# Patient Record
Sex: Male | Born: 1935 | Race: White | Hispanic: No | Marital: Married | Smoking: Never smoker
Health system: Southern US, Community
[De-identification: ages and names within clinical notes are randomized; demographics above are authoritative.]

## PROBLEM LIST (undated history)

## (undated) DIAGNOSIS — E785 Hyperlipidemia, unspecified: Secondary | ICD-10-CM

## (undated) DIAGNOSIS — N400 Enlarged prostate without lower urinary tract symptoms: Secondary | ICD-10-CM

## (undated) DIAGNOSIS — I10 Essential (primary) hypertension: Secondary | ICD-10-CM

## (undated) DIAGNOSIS — C679 Malignant neoplasm of bladder, unspecified: Secondary | ICD-10-CM

## (undated) DIAGNOSIS — I341 Nonrheumatic mitral (valve) prolapse: Secondary | ICD-10-CM

## (undated) DIAGNOSIS — J302 Other seasonal allergic rhinitis: Secondary | ICD-10-CM

## (undated) DIAGNOSIS — G20 Parkinson's disease: Secondary | ICD-10-CM

## (undated) DIAGNOSIS — R011 Cardiac murmur, unspecified: Secondary | ICD-10-CM

## (undated) DIAGNOSIS — G20A1 Parkinson's disease without dyskinesia, without mention of fluctuations: Secondary | ICD-10-CM

## (undated) DIAGNOSIS — K56609 Unspecified intestinal obstruction, unspecified as to partial versus complete obstruction: Secondary | ICD-10-CM

## (undated) DIAGNOSIS — D126 Benign neoplasm of colon, unspecified: Secondary | ICD-10-CM

## (undated) HISTORY — PX: TONSILLECTOMY AND ADENOIDECTOMY: SHX28

## (undated) HISTORY — DX: Hyperlipidemia, unspecified: E78.5

## (undated) HISTORY — DX: Nonrheumatic mitral (valve) prolapse: I34.1

## (undated) HISTORY — PX: TONSILLECTOMY: SUR1361

## (undated) HISTORY — DX: Benign neoplasm of colon, unspecified: D12.6

## (undated) HISTORY — DX: Malignant neoplasm of bladder, unspecified: C67.9

## (undated) HISTORY — PX: ELBOW SURGERY: SHX618

## (undated) HISTORY — PX: APPENDECTOMY: SHX54

---

## 2001-08-22 ENCOUNTER — Encounter (INDEPENDENT_AMBULATORY_CARE_PROVIDER_SITE_OTHER): Payer: Self-pay | Admitting: Specialist

## 2001-08-22 ENCOUNTER — Other Ambulatory Visit: Admission: RE | Admit: 2001-08-22 | Discharge: 2001-08-22 | Payer: Self-pay | Admitting: Urology

## 2002-03-16 ENCOUNTER — Encounter: Payer: Self-pay | Admitting: Emergency Medicine

## 2002-03-16 ENCOUNTER — Inpatient Hospital Stay (HOSPITAL_COMMUNITY): Admission: EM | Admit: 2002-03-16 | Discharge: 2002-03-20 | Payer: Self-pay | Admitting: Emergency Medicine

## 2002-03-17 ENCOUNTER — Encounter: Payer: Self-pay | Admitting: Cardiology

## 2002-03-19 ENCOUNTER — Encounter: Payer: Self-pay | Admitting: Cardiology

## 2002-10-09 ENCOUNTER — Emergency Department (HOSPITAL_COMMUNITY): Admission: EM | Admit: 2002-10-09 | Discharge: 2002-10-10 | Payer: Self-pay | Admitting: Emergency Medicine

## 2002-10-09 ENCOUNTER — Encounter: Payer: Self-pay | Admitting: Emergency Medicine

## 2005-04-09 ENCOUNTER — Ambulatory Visit: Payer: Self-pay | Admitting: Internal Medicine

## 2005-05-08 ENCOUNTER — Ambulatory Visit: Payer: Self-pay | Admitting: Internal Medicine

## 2005-05-08 ENCOUNTER — Encounter (INDEPENDENT_AMBULATORY_CARE_PROVIDER_SITE_OTHER): Payer: Self-pay | Admitting: Specialist

## 2006-04-06 ENCOUNTER — Ambulatory Visit: Payer: Self-pay

## 2007-03-18 ENCOUNTER — Ambulatory Visit: Payer: Self-pay | Admitting: Cardiology

## 2007-03-25 ENCOUNTER — Ambulatory Visit: Payer: Self-pay | Admitting: Cardiovascular Disease

## 2007-03-25 ENCOUNTER — Inpatient Hospital Stay (HOSPITAL_BASED_OUTPATIENT_CLINIC_OR_DEPARTMENT_OTHER): Admission: RE | Admit: 2007-03-25 | Discharge: 2007-03-25 | Payer: Self-pay | Admitting: Cardiovascular Disease

## 2007-04-05 ENCOUNTER — Ambulatory Visit: Payer: Self-pay | Admitting: Cardiology

## 2007-04-12 ENCOUNTER — Ambulatory Visit (HOSPITAL_COMMUNITY): Admission: RE | Admit: 2007-04-12 | Discharge: 2007-04-12 | Payer: Self-pay | Admitting: Internal Medicine

## 2008-03-14 ENCOUNTER — Emergency Department (HOSPITAL_COMMUNITY): Admission: EM | Admit: 2008-03-14 | Discharge: 2008-03-14 | Payer: Self-pay | Admitting: Emergency Medicine

## 2008-11-16 DIAGNOSIS — C679 Malignant neoplasm of bladder, unspecified: Secondary | ICD-10-CM

## 2008-11-16 HISTORY — DX: Malignant neoplasm of bladder, unspecified: C67.9

## 2008-11-16 HISTORY — PX: BLADDER SURGERY: SHX569

## 2009-05-23 ENCOUNTER — Encounter (INDEPENDENT_AMBULATORY_CARE_PROVIDER_SITE_OTHER): Payer: Self-pay | Admitting: *Deleted

## 2009-09-10 ENCOUNTER — Encounter (INDEPENDENT_AMBULATORY_CARE_PROVIDER_SITE_OTHER): Payer: Self-pay | Admitting: Urology

## 2009-09-10 ENCOUNTER — Ambulatory Visit (HOSPITAL_BASED_OUTPATIENT_CLINIC_OR_DEPARTMENT_OTHER): Admission: RE | Admit: 2009-09-10 | Discharge: 2009-09-10 | Payer: Self-pay | Admitting: Podiatry

## 2009-10-03 ENCOUNTER — Inpatient Hospital Stay (HOSPITAL_COMMUNITY): Admission: EM | Admit: 2009-10-03 | Discharge: 2009-10-07 | Payer: Self-pay | Admitting: Emergency Medicine

## 2010-05-26 ENCOUNTER — Encounter: Payer: Self-pay | Admitting: Cardiovascular Disease

## 2010-05-26 ENCOUNTER — Ambulatory Visit: Payer: Self-pay | Admitting: Cardiology

## 2010-05-26 ENCOUNTER — Emergency Department (HOSPITAL_COMMUNITY): Admission: EM | Admit: 2010-05-26 | Discharge: 2010-05-26 | Payer: Self-pay | Admitting: Emergency Medicine

## 2010-10-14 DIAGNOSIS — R310 Gross hematuria: Secondary | ICD-10-CM | POA: Insufficient documentation

## 2010-10-14 DIAGNOSIS — C679 Malignant neoplasm of bladder, unspecified: Secondary | ICD-10-CM | POA: Insufficient documentation

## 2010-10-14 DIAGNOSIS — E785 Hyperlipidemia, unspecified: Secondary | ICD-10-CM

## 2010-10-14 DIAGNOSIS — I10 Essential (primary) hypertension: Secondary | ICD-10-CM

## 2010-10-14 DIAGNOSIS — I447 Left bundle-branch block, unspecified: Secondary | ICD-10-CM

## 2010-10-15 ENCOUNTER — Ambulatory Visit: Payer: Self-pay | Admitting: Cardiology

## 2010-10-15 ENCOUNTER — Encounter: Payer: Self-pay | Admitting: Cardiology

## 2010-10-15 DIAGNOSIS — R0602 Shortness of breath: Secondary | ICD-10-CM

## 2010-10-15 DIAGNOSIS — IMO0001 Reserved for inherently not codable concepts without codable children: Secondary | ICD-10-CM

## 2010-10-15 DIAGNOSIS — R5383 Other fatigue: Secondary | ICD-10-CM

## 2010-10-15 DIAGNOSIS — R5381 Other malaise: Secondary | ICD-10-CM | POA: Insufficient documentation

## 2010-10-17 LAB — CONVERTED CEMR LAB
ALT: 15 units/L (ref 0–53)
AST: 19 units/L (ref 0–37)
Alkaline Phosphatase: 59 units/L (ref 39–117)
Basophils Absolute: 0 10*3/uL (ref 0.0–0.1)
Bilirubin, Direct: 0.1 mg/dL (ref 0.0–0.3)
CO2: 28 meq/L (ref 19–32)
Chloride: 101 meq/L (ref 96–112)
Eosinophils Relative: 2.3 % (ref 0.0–5.0)
Glucose, Bld: 118 mg/dL — ABNORMAL HIGH (ref 70–99)
Lymphocytes Relative: 32.3 % (ref 12.0–46.0)
Monocytes Relative: 9.7 % (ref 3.0–12.0)
Platelets: 161 10*3/uL (ref 150.0–400.0)
Potassium: 4.4 meq/L (ref 3.5–5.1)
RDW: 12.3 % (ref 11.5–14.6)
Sed Rate: 5 mm/hr (ref 0–22)
Sodium: 136 meq/L (ref 135–145)
Total CK: 65 units/L (ref 7–232)
Total Protein: 7 g/dL (ref 6.0–8.3)
WBC: 5.7 10*3/uL (ref 4.5–10.5)

## 2010-10-27 ENCOUNTER — Telehealth (INDEPENDENT_AMBULATORY_CARE_PROVIDER_SITE_OTHER): Payer: Self-pay | Admitting: Radiology

## 2010-10-28 ENCOUNTER — Encounter (HOSPITAL_COMMUNITY)
Admission: RE | Admit: 2010-10-28 | Discharge: 2010-12-16 | Payer: Self-pay | Source: Home / Self Care | Attending: Cardiology | Admitting: Cardiology

## 2010-10-28 ENCOUNTER — Ambulatory Visit: Payer: Self-pay

## 2010-10-28 ENCOUNTER — Encounter: Payer: Self-pay | Admitting: Cardiology

## 2010-10-28 ENCOUNTER — Ambulatory Visit (HOSPITAL_COMMUNITY)
Admission: RE | Admit: 2010-10-28 | Discharge: 2010-10-28 | Payer: Self-pay | Source: Home / Self Care | Attending: Cardiology | Admitting: Cardiology

## 2010-12-16 NOTE — Letter (Signed)
Summary: [facility] Physician Documentation Sheet  [facility] Physician Documentation Sheet   Imported By: Earl Many 10/14/2010 15:59:10  _____________________________________________________________________  External Attachment:    Type:   Image     Comment:   External Document

## 2010-12-16 NOTE — Assessment & Plan Note (Signed)
Summary: rov per pt call/lg   CC:  check up...pt states he stopped his lovastatin.  History of Present Illness: Mr. Jeremiah Zimmerman is a 77 year old male with a past medical history of hypertension and hyperlipidemia, as well as nonischemic cardiomyopathy who presents for evaluation of lower extremity weakness and dyspnea.  The patient has not been seen in this office since May of 2008.  Note:  He does have a history of nonischemic cardiomyopathy by report.  He had a cardiac catheterization performed on Mar 20, 2002, by Dr. Gerri Spore. There was moderate global hypokinesis of the left ventricle with an ejection fraction of 45 percent. There was mild mitral valve prolapse and trace mitral regurgitation.  He had a 30 percent left main, 40 percent LAD at the origin and 20 percent mid.  There was no other obstructive disease noted.  He was treated medically for that.  Repeat cardiac catheterization in May of 2008 showed nonobstructive coronary disease and an ejection fraction of 50%. Note:  His most recent echocardiogram was performed on Apr 06, 2006, and he had normal LV function.  There was mild mitral regurgitation.  There was also mild tricuspid regurgitation.  He had carotid Doppler performed in May of 2003 that showed no significant obstruction. The patient does have some dyspnea on exertion but there is no orthopnea, PND, pedal edema or syncope. He does not have chest pain. He does notice some dizziness with going from the sitting to the standing position. He also over the past 6-8 months has noticed progressive weakness in his thighs bilaterally. He finds it difficult to stand from a sitting position. There is also pain in his hips and thighs. There is some cramping in his calves but there is no weakness. He discontinued his lovastatin but this has not helped.  Current Medications (verified): 1)  Flomax 0.4 Mg Caps (Tamsulosin Hcl) .Marland Kitchen.. 1 Tab By Mouth Once Daily 2)  Aspirin 81 Mg Tbec (Aspirin) .... Take One Tablet  By Mouth Daily 3)  Lisinopril 10 Mg Tabs (Lisinopril) .... Take One Tablet By Mouth Daily 4)  Fish Oil   Oil (Fish Oil) .Marland Kitchen.. 1 Tab By Mouth Once Daily  Past History:  Past Medical History: BUNDLE BRANCH BLOCK, LEFT HYPERLIPIDEMIA HYPERTENSION  BPH BLADDER CANCER   Past Surgical History: bladder cancer status post TURP in November 2011 Surgery on left elbow  Tonsillectomy  Appendectomy  Family History: Reviewed history from 10/14/2010 and no changes required. Mother deceased with breast cancer.  Father had an MI in his 1s and 61s.  He also had a CVA and a history of bladder cancer  and hypertension.  He has a brother with an MI at age 61 and two sisters with hypertension.      Social History: Reviewed history from 10/14/2010 and no changes required.  He lives in [place] with his wife.  He is a retired Optician, dispensing.  He has 3 children.  Does not smoke, drink, use drugs.   Review of Systems       Complains of fatigue and weakness but no fevers or chills, productive cough, hemoptysis, dysphasia, odynophagia, melena, hematochezia, dysuria, hematuria, rash, seizure activity, orthopnea, PND, pedal edema, claudication. Remaining systems are negative.   Vital Signs:  Patient profile:   77 year old male Height:      70 inches Weight:      172 pounds BMI:     24.77 Pulse rate:   70 / minute Resp:     14 per minute  BP sitting:   155 / 68  (left arm)  Vitals Entered By: Kem Parkinson (October 15, 2010 3:29 PM)  Physical Exam  General:  Well developed/well nourished in NAD Skin warm/dry Patient not depressed No peripheral clubbing Back-normal HEENT-normal/normal eyelids Neck supple/normal carotid upstroke bilaterally; no bruits; no JVD; no thyromegaly chest - CTA/ normal expansion CV - RRR/normal S1 and S2; no  rubs or gallops;  PMI nondisplaced; 2/6 systolic ejection murmur Abdomen -NT/ND, no HSM, no mass, + bowel sounds, no bruit 2+ femoral pulses, no bruits Ext-no  edema, chords, 2+ DP Neuro-grossly nonfocal     EKG  Procedure date:  10/15/2010  Findings:      Sinus rhythm with a left bundle branch block.  Impression & Recommendations:  Problem # 1:  MUSCLE PAIN (ICD-729.1) Etiology unclear. He is also complaining of bilateral thigh weakness to the point he cannot stand at times. He discontinued his lovastatin and his symptoms have not improved. I will check a creatinine kinase as well as a sed rate. I will refer him to neurology for further evaluation and consideration of  syndromes such as proximal myotonic myopathy, myasthenia gravis or polymyositis.  Problem # 2:  OTHER MALAISE AND FATIGUE (ICD-780.79) Check TSH and hemoglobin.  Orders: TLB-CK Total Only(Creatine Kinase/CPK) (82550-CK) TLB-Sedimentation Rate (ESR) (85652-ESR) TLB-CBC Platelet - w/Differential (85025-CBCD) TLB-BMP (Basic Metabolic Panel-BMET) (80048-METABOL) TLB-Hepatic/Liver Function Pnl (80076-HEPATIC) TLB-TSH (Thyroid Stimulating Hormone) (02725-DGU) Neurology Referral (Neuro)  Problem # 3:  DYSPNEA (ICD-786.05) Schedule echocardiogram and Myoview to further evaluate. His updated medication list for this problem includes:    Aspirin 81 Mg Tbec (Aspirin) .Marland Kitchen... Take one tablet by mouth daily    Lisinopril 10 Mg Tabs (Lisinopril) .Marland Kitchen... Take one tablet by mouth daily  Orders: Echocardiogram (Echo) Nuclear Stress Test (Nuc Stress Test)  Problem # 4:  HYPERLIPIDEMIA (ICD-272.4) Given muscle weakness we'll continue off of statin.  Problem # 5:  HYPERTENSION (ICD-401.9) Blood pressure mildly elevated but he is complaining of dizziness with standing. I will therefore not increase his medications at this point. His updated medication list for this problem includes:    Aspirin 81 Mg Tbec (Aspirin) .Marland Kitchen... Take one tablet by mouth daily    Lisinopril 10 Mg Tabs (Lisinopril) .Marland Kitchen... Take one tablet by mouth daily  Patient Instructions: 1)  Your physician recommends that  you schedule a follow-up appointment in: 3 MONTHS 2)  You have been referred to NEUROLOGY-PROXIMAL LEG WEAKNESS AND FATIQUE 3)  Your physician has requested that you have an echocardiogram.  Echocardiography is a painless test that uses sound waves to create images of your heart. It provides your doctor with information about the size and shape of your heart and how well your heart's chambers and valves are working.  This procedure takes approximately one hour. There are no restrictions for this procedure. 4)  Your physician has requested that you have an LEXISCAN stress myoview.  For further information please visit [email or web address]  Please follow instruction sheet, as given.

## 2010-12-18 NOTE — Assessment & Plan Note (Signed)
Summary: Cardiology Nuclear Testing  Nuclear Med Background Indications for Stress Test: Evaluation for Ischemia   History: Echo, Heart Catheterization  History Comments: '07 Echo:normal EF; '08 Cath:n/o CAD, EF=50%  Symptoms: Chest Pressure, Diaphoresis, DOE, Fatigue, Palpitations  Symptoms Comments: Last episode of CP:2 months ago.   Nuclear Pre-Procedure Cardiac Risk Factors: Family History - CAD, Hypertension, LBBB, Lipids Caffeine/Decaff Intake: none NPO After: 8:00 PM Lungs: Clear.  O2 Sat 98% on RA. IV 0.9% NS with Angio Cath: 22g     IV Site: R Hand IV Started by: Cathlyn Parsons, RN Chest Size (in) 38-40     Height (in): 70 Weight (lb): 172 BMI: 24.77  Nuclear Med Study 1 or 2 day study:  1 day     Stress Test Type:  Adenosine Reading MD:  Olga Millers, MD     Referring MD:  Olga Millers, MD Resting Radionuclide:  Technetium 71m Tetrofosmin     Resting Radionuclide Dose:  11.0 mCi  Stress Radionuclide:  Technetium 53m Tetrofosmin     Stress Radionuclide Dose:  33.0 mCi   Stress Protocol  Dose of Adenosine:  43.8 mg    Stress Test Technologist:  Rea College, CMA-N     Nuclear Technologist:  Domenic Polite, CNMT  Rest Procedure  Myocardial perfusion imaging was performed at rest 45 minutes following the intravenous administration of Technetium 61m Tetrofosmin.  Stress Procedure  The patient received IV adenosine at 140 mcg/kg/min for 4 minutes. There were episodes of complete heart block with infusion. She did c/o chest pressure with infusion.  Technetium 87m Tetrofosmin was injected at the 2 minute mark and quantitative spect images were obtained after a 45 minute delay.  QPS Raw Data Images:  Acquisition technically good; normal left ventricular size. Stress Images:  There is decreased uptake in the inferoseptum. Rest Images:  There is decreased uptake in the inferoseptum. Subtraction (SDS):  No evidence of ischemia. Transient Ischemic Dilatation:   1.06  (Normal <1.22)  Lung/Heart Ratio:  .30  (Normal <0.45)  Quantitative Gated Spect Images QGS EDV:  109 ml QGS ESV:  40 ml QGS EF:  63 % QGS cine images:  Normal wall motion.   Overall Impression  Exercise Capacity: Adenosine study with no exercise. BP Response: Hypotensive blood pressure response. ECG Impression: Not interpretable due to LBBB. Overall Impression: Abnormal adenosine nuclear study with fixed inferoseptal defect most likely related to LBBB; no ischemia.  Appended Document: Cardiology Nuclear Testing pt aware of results

## 2010-12-18 NOTE — Progress Notes (Signed)
Summary: nuc pre-procedure  Phone Note Outgoing Call   Call placed by: Harlow Asa CNMT Call placed to: Patient Reason for Call: Confirm/change Appt Summary of Call: Left message with information on Myoview Information Sheet (see scanned document for details).      Nuclear Med Background Indications for Stress Test: Evaluation for Ischemia   History: Echo, Heart Catheterization  History Comments: '07 Echo NL EF; '08 Cath no coronary artery disease/ Ef =50%  Symptoms: DOE, Fatigue    Nuclear Pre-Procedure Cardiac Risk Factors: Family History - CAD, Hypertension, LBBB, Lipids Height (in): 70

## 2011-01-09 ENCOUNTER — Encounter: Payer: Self-pay | Admitting: Cardiology

## 2011-01-12 ENCOUNTER — Ambulatory Visit: Payer: Self-pay | Admitting: Cardiology

## 2011-01-13 ENCOUNTER — Ambulatory Visit: Payer: Self-pay | Admitting: Cardiology

## 2011-01-22 NOTE — Letter (Signed)
Summary: [facility]  [facility]   Imported By: Marylou Mccoy 01/16/2011 16:24:34  _____________________________________________________________________  External Attachment:    Type:   Image     Comment:   External Document

## 2011-02-01 LAB — CBC
HCT: 41 % (ref 39.0–52.0)
MCH: 32 pg (ref 26.0–34.0)
MCV: 92.9 fL (ref 78.0–100.0)
RDW: 12.2 % (ref 11.5–15.5)
WBC: 7.1 10*3/uL (ref 4.0–10.5)

## 2011-02-01 LAB — COMPREHENSIVE METABOLIC PANEL
Alkaline Phosphatase: 54 U/L (ref 39–117)
BUN: 14 mg/dL (ref 6–23)
Chloride: 105 mEq/L (ref 96–112)
Glucose, Bld: 126 mg/dL — ABNORMAL HIGH (ref 70–99)
Potassium: 4 mEq/L (ref 3.5–5.1)
Total Bilirubin: 1 mg/dL (ref 0.3–1.2)
Total Protein: 6.4 g/dL (ref 6.0–8.3)

## 2011-02-01 LAB — URINALYSIS, ROUTINE W REFLEX MICROSCOPIC
Bilirubin Urine: NEGATIVE
Glucose, UA: NEGATIVE mg/dL
Hgb urine dipstick: NEGATIVE
Ketones, ur: NEGATIVE mg/dL
Protein, ur: NEGATIVE mg/dL

## 2011-02-01 LAB — POCT CARDIAC MARKERS
CKMB, poc: <1 ng/mL — ABNORMAL LOW (ref 1.0–8.0)
Myoglobin, poc: 58.4 ng/mL (ref 12–200)

## 2011-02-01 LAB — DIFFERENTIAL
Basophils Absolute: 0 10*3/uL (ref 0.0–0.1)
Basophils Relative: 0 % (ref 0–1)
Neutro Abs: 5.4 10*3/uL (ref 1.7–7.7)
Neutrophils Relative %: 76 % (ref 43–77)

## 2011-02-18 LAB — COMPREHENSIVE METABOLIC PANEL
ALT: 19 U/L (ref 0–53)
AST: 21 U/L (ref 0–37)
Calcium: 9.6 mg/dL (ref 8.4–10.5)
GFR calc Af Amer: >60 mL/min (ref >60)
Sodium: 133 mEq/L — ABNORMAL LOW (ref 135–145)
Total Protein: 7.4 g/dL (ref 6.0–8.3)

## 2011-02-18 LAB — CBC
HCT: 34.6 % — ABNORMAL LOW (ref 39.0–52.0)
Hemoglobin: 11.1 g/dL — ABNORMAL LOW (ref 13.0–17.0)
Hemoglobin: 11.8 g/dL — ABNORMAL LOW (ref 13.0–17.0)
MCHC: 33.7 g/dL (ref 30.0–36.0)
MCHC: 33.8 g/dL (ref 30.0–36.0)
RBC: 3.54 MIL/uL — ABNORMAL LOW (ref 4.22–5.81)
RBC: 3.76 MIL/uL — ABNORMAL LOW (ref 4.22–5.81)
RDW: 11.9 % (ref 11.5–15.5)
RDW: 12.2 % (ref 11.5–15.5)
WBC: 6.4 10*3/uL (ref 4.0–10.5)

## 2011-02-18 LAB — DIFFERENTIAL
Eosinophils Absolute: 0.2 10*3/uL (ref 0.0–0.7)
Eosinophils Relative: 2 % (ref 0–5)
Lymphs Abs: 3.9 10*3/uL (ref 0.7–4.0)
Monocytes Relative: 9 % (ref 3–12)
Neutrophils Relative %: 48 % (ref 43–77)

## 2011-02-18 LAB — HEMOGLOBIN AND HEMATOCRIT, BLOOD: Hemoglobin: 11.1 g/dL — ABNORMAL LOW (ref 13.0–17.0)

## 2011-02-18 LAB — PROTIME-INR: INR: 1.05 (ref 0.00–1.49)

## 2011-02-19 LAB — POCT I-STAT 4, (NA,K, GLUC, HGB,HCT)
Glucose, Bld: 101 mg/dL — ABNORMAL HIGH (ref 70–99)
HCT: 47 % (ref 39.0–52.0)
Hemoglobin: 16 g/dL (ref 13.0–17.0)
Potassium: 4.4 mEq/L (ref 3.5–5.1)
Sodium: 142 meq/L (ref 135–145)

## 2011-03-31 NOTE — Assessment & Plan Note (Signed)
[facility]                            CARDIOLOGY OFFICE NOTE   Jeremiah Zimmerman, Jeremiah Zimmerman                         MRN:          [id number]  DATE:04/05/2007                            DOB:          December 07, 1933    Mr. Jeremiah Zimmerman returns for followup today. He is a pleasant gentleman that I  recently saw for chest pain, fatigue, and dyspnea on exertion. Please  refer to my previous note on Mar 18, 2007 for details. The patient did  undergo cardiac catheterization on Mar 25, 2007. He was found to have  minor non obstructive coronary disease and his ejection fraction was low  normal at 50%. Since then his symptoms are unchanged. He has a tightness  when he exerts himself as well as dyspnea. There is also continued  fatigue. His medications include;  1. Lovastatin 20 mg p.o. at bedtime.  2. Lisinopril 10 mg p.o. daily.  3. Fish oil.  4. Avodart.  5. Aspirin 81 mg p.o. daily.   PHYSICAL EXAMINATION:  Shows a blood pressure of 120/70, his pulse is  88.  NECK: Supple.  CHEST: Clear.  CARDIOVASCULAR EXAM: Reveals a regular rate and rhythm.  His right groin shows no hematoma. No bruits.  EXTREMITIES: Show no edema.   DIAGNOSES:  1. Chest pain and dyspnea- his symptoms persist but his      catheterization revealed non obstructive coronary disease. I      therefore do not think we need to pursue further cardiac      evaluation. We will continue with his aspirin, statin, and ACE      inhibitor. Note he asked me whether these medicines could be      contributing to his fatigue. I think the likelihood is small, as he      was having these symptoms prior to them being initiated. He will      contact us if he would like to discontinue some of these in the      future on a trail basis.  2. Hyperlipidemia- Dr. Wylene Simmer is following his lipid and liver.  3. Hypertension- his blood pressure is well controlled.   I will see him back in 12 months or sooner if necessary.     Madolyn Frieze Jens Som, MD, [facility]  Electronically Signed    BSC/MedQ  DD: 04/05/2007  DT: 04/05/2007  Job #: [id number]   cc:   Gaspar Garbe, M.D.

## 2011-03-31 NOTE — Assessment & Plan Note (Signed)
[facility]                            CARDIOLOGY OFFICE NOTE   NAME:WEBBZorawar, Strollo                       MRN:          [id number]  DATE:03/18/2007                            DOB:          [birth year]    Mr. Angerer is a 77 year old male with a past medical history of  hypertension and hyperlipidemia, as well as nonischemic cardiomyopathy  who we are asked to evaluate for dyspnea and chest pain.  The patient  has not been seen in this office since June of 2004.  Note:  He does  have a history of nonischemic cardiomyopathy by report.  He had a  cardiac catheterization performed on Mar 20, 2002, by Dr. Gerri Spore.  At  that time, he was having chest pain and dyspnea.  There was moderate  global hypokinesis of the left ventricle with an ejection fraction of 45  percent.  There was mild mitral valve prolapse and trace mitral  regurgitation.  He had a 30 percent left main, 40 percent LAD at the  origin and 20 percent mid.  There was no other obstructive disease  noted.  He was treated medically for that.  Note:  His most recent  echocardiogram was performed on Apr 06, 2006, and he had normal LV  function.  There was mild mitral regurgitation.  There was also mild  tricuspid regurgitation.  He had carotid Doppler performed in May of  2003 that showed no significant obstruction.  The patient now complains  of fatigue, dyspnea on exertion, as well as chest pain with exertion.  His symptoms have been present since his catheterization previously.  He  states that he can work only five to ten minutes and develops severe  dyspnea, as well as a tightness in his chest.  The pain is not pleuritic  or positional, nor is it related to food.  It resolves after he rests.  Note:  The pain does not radiate and there is no associated, nausea,  vomiting, or diaphoresis.  He does not have orthopnea, PND, or pedal  edema, and there are no palpitations or syncope.  He was seen by  Dr.  Wylene Simmer today and we were asked to further evaluate.   CURRENT MEDICATIONS:  1. Lovastatin 20 mg p.o. q.h.s.  2. Lisinopril 10 mg p.o. b.i.d.  3. Fish oil.  4. Avodart.   ALLERGIES:  PENICILLIN.   SOCIAL HISTORY:  He does not smoke nor does he consume alcohol.   FAMILY HISTORY:  His father had a myocardial infarction in his 59's and  died in his late 68's.  He also has a brother who had a myocardial  infarction at age 20.   PAST MEDICAL HISTORY:  Significant for hypertension as well as  hyperlipidemia.  He also has a history of mitral valve prolapse.  He has  had a previous tonsillectomy as well as appendectomy.  He has benign  prostatic hypertrophy.  He has a history of hypotension secondary to  nitroglycerin.  He also has a history of migraines and goiter.   REVIEW OF SYSTEMS:  He denies any  headaches, fevers, or chills.  There  is no productive cough or hemoptysis.  There is no dysphagia,  odynophagia, melena, or hematochezia.  There is no dysuria or hematuria.  There are no rashes or seizure activity.  There is no orthopnea, PND, or  pedal edema.  There is no claudication noted.  The remaining systems are  negative other than fatigue.   PHYSICAL EXAMINATION:  VITAL SIGNS:  Blood pressure of 133/77.  Pulse  76.  He weighs 176 pounds.  GENERAL:  He is well-developed and well-nourished, in no acute distress.  SKIN:  Warm and dry.  He does not appear to be depressed and there is no peripheral clubbing.  BACK:  Normal.  HEENT:  Normal with normal eyelids.  NECK:  Supple with a normal upstroke bilaterally and there are no bruits  noted.  There is no jugular venous distention and no thyromegaly is  noted.  CHEST:  Clear to auscultation.  No distention.  CARDIOVASCULAR:  Regular rate and rhythm.  Normal S1, S2.  There are no  murmurs, rubs, or gallops noted.  ABDOMEN:  Nontender, nondistended.  Positive bowel sounds.  No  hepatosplenomegaly.  No masses appreciated.  There  is no abdominal  bruit.  He has 2+ femoral pulses bilaterally.  No bruits.  EXTREMITIES:  No edema palpated.  No cords.  He has 2+ dorsalis pedis  pulses bilaterally.  NEUROLOGIC:  Exam is grossly intact.   His electrocardiogram shows a sinus rhythm with a left bundle branch  block.  The left bundle branch block is old.  Note:  The patient also  had a TSH performed recently that was normal at 1.71, and his hemoglobin  was normal.   DIAGNOSES:  1. Chest pain and dyspnea on exertion- I have discussed this at length      with the patient today.  His symptoms sound concerning for coronary      disease but he has had these for the past five years and they have      been present since his previous cardiac catheterization, which      revealed nonobstructive disease.  However, he did have a 30 percent      left main at that time as well as a 40 percent left anterior      descending.  He also has a left bundle branch block at baseline.  I      think the patient will need definitive evaluation and I have      discussed the risks and benefits of cardiac catheterization and he      has agree to proceed.  We will proceed with this as an outpatient.      We will add aspirin to his medical regimen at 81 mg p.o. daily.  We      will continue with his statin and ACE inhibitor for now.  Note:  He      has had hypotension with nitroglycerin in the past.  2. Hyperlipidemia- his most recent lipids will be forwarded to [place] for      our records.  Our goal LDL should be less than 70 given his history      of coronary disease.  We will continue his lovastatin at this      point.  3. Hypertension- his blood pressure is well controlled on his present      medications.  4. History of mitral valve prolapse.   I will see him back in four to  six weeks after we have the results of  his catheterization.  If his left ventricular function remains  diminished, then we will try to add a low dose beta  blocker.    Madolyn Frieze Jens Som, MD, [facility]  Electronically Signed    BSC/MedQ  DD: 03/18/2007  DT: 03/18/2007  Job #: [id number]   cc:   Gaspar Garbe, M.D.

## 2011-03-31 NOTE — Cardiovascular Report (Signed)
NAMEJOH, RAO NO.:  [id number]   MEDICAL RECORD NO.:  [id number]          PATIENT TYPE:  OIB   LOCATION:  1961                         FACILITY:  [facility]   PHYSICIAN:  Veverly Fells. Excell Seltzer, MD  DATE OF BIRTH:  07-16-1934   DATE OF PROCEDURE:  DATE OF DISCHARGE:                            CARDIAC CATHETERIZATION   PROCEDURE:  Left heart catheterization, selective coronary angiography,  left ventricular angiography.   INDICATIONS:  Mr. Jeremiah Zimmerman is a 77 year old gentleman who has a history of  nonobstructive coronary artery disease from a catheterization back in  2003.  He was recently evaluated by Dr. Jens Som and has been  complaining of persistent dyspnea and chest pain.  He was in the setting  of known plaque in the left mainstem and ostial LAD with ongoing  symptoms of dyspnea and exertional chest pain.  He was referred for  reevaluation with cardiac catheterization.   Risks and indications of procedure were explained to the patient.  Informed consent was obtained.  The right groin was prepped, draped and  anesthetized with 1% lidocaine.  Using modified Seldinger technique a 4-  French sheath was placed in the right femoral artery.  Multiple views of  the left and right coronary arteries were taken using standard 4-French  catheters.  Following selective coronary angiography, an angled pigtail  catheter was inserted into the left ventricle and pressures were  recorded.  A left ventriculogram was performed to pullback across the  aortic valve at conclusion of the procedure.  The sheath was pulled and  manual pressure used for hemostasis.  All catheter exchanges were  performed over a guidewire.  There were no complications.   FINDINGS:  Aortic pressure 128/54 with a mean of 84, left ventricular  pressure 131/4 with an end-diastolic pressure of 10.   The left main stem has minor nonobstructive plaque in the mid portion.  It is widely patent.  It bifurcates  into the LAD and left circumflex.  The LAD is a large-caliber vessel that courses down and wraps around the  LV apex.  It gives off a large first diagonal branch that bifurcates  into twin vessels.  The second diagonal branch is small.  The ostium of  the LAD has nonobstructive disease in the range of 20-30% stenosis.  There is no significant angiographic disease throughout the remaining  portions of the LAD.   The left circumflex is a medium caliber vessel.  It gives off two obtuse  marginal branches that are also medium size.  The AV groove circumflex  then courses down and gives off two posterolateral branches that are  small diameter.  There is no significant angiographic disease in the  left circumflex system.   The right coronary artery is dominant and provides a right PDA branch  and right posterolateral branch.  There is no significant angiographic  disease in the right coronary artery.   Left ventricular function assessed by 30 degrees RAO left  ventriculography shows low normal LV systolic function with an estimated  EF of 50%.  LV function was difficult to assess due  to ventricular  ectopy with contrast injection.   ASSESSMENT:  1. Minor nonobstructive coronary artery disease.  2. Low normal left ventricular systolic function.  3. Normal left ventricular diastolic filling pressures.   Mr. Schroeter symptoms are likely noncardiac in origin.  He has no  significant obstructive coronary disease and his left heart hemodynamics  are normal.  He will follow-up with Dr. Jens Som.      Veverly Fells. Excell Seltzer, MD  Electronically Signed     MDC/MEDQ  D:  03/25/2007  T:  03/25/2007  Job:  [id number]   cc:   Madolyn Frieze. Jens Som, MD, [facility]  Gaspar Garbe, M.D.

## 2011-04-03 NOTE — H&P (Signed)
[place]. [facility]  Patient:    Jeremiah Zimmerman, Jeremiah Zimmerman Visit Number: [id number] MRN: [id number]          Service Type: MED Location: 3700 [phone] Attending Physician:  Rollene Rotunda Dictated by:   Rollene Rotunda, M.D. [facility] Admit Date:  03/16/2002   CC:         Gaspar Garbe, M.D.   History and Physical  PRIMARY CARE PHYSICIAN:  Dr. Adelfa Koh. Tisovec.  REASON FOR PRESENTATION:  Evaluate patient with chest pain.  HISTORY OF PRESENT ILLNESS:  The patient is a pleasant 77 year old gentleman whose past cardiac history includes a cardiac catheterization in 1992 for evaluation of chest pain.  At that time, he had normal coronary arteries and normal LV function.  He now presents with symptoms beginning on Tuesday.  He noticed that while pushing a lawnmower, he became significantly diaphoretic and nauseated.  He only pushed it for a few minutes.  He was short of breath with this.  He stopped and the symptoms went away after a few hours. Throughout that evening, he had some chest discomfort on and off.  These were described a substernal aching pains.  These were unlike any previous symptoms he had had.  They would last for 5 to 10 minutes.  He did not have these significantly on Wednesday but awoke this morning with a dull ache in his chest and some left arm numbness; because of that, he presented to the emergency room, where he has been pain-free.  Of note, the patient also describes an episode of visual loss on Monday.  He described this as a field cut on the periphery of the right eye lasting for a few minutes.  In retrospect, he has been experiencing symptoms consistent with amaurosis fugax for the past few years.  He does not think that he has reported this to a physician.  PAST MEDICAL HISTORY:  Hypertension recently diagnosed; mitral valve prolapse.  PAST SURGICAL HISTORY:  Appendectomy, tonsillectomy.  ALLERGIES:  PENICILLIN causes a rash, SULFA  causes an unknown reaction, NITROGLYCERIN caused his blood pressure to drop in the past.  SOCIAL HISTORY:  The patient is a retired Optician, dispensing.  He has been married for 47 years.  He has never smoked cigarettes and does not drink alcohol.  FAMILY HISTORY:  Family history is contributory for his father having myocardial infarctions in his 19s and dying in his late 73s with this and CVAs; he also has a brother who is alive but had a myocardial infarction at age 75.  REVIEW OF SYSTEMS:  As stated in the HPI, positive for increasing dyspnea with exertion, positive for decreased exercise tolerance, otherwise, as stated in the HPI and negative for all other systems.  PHYSICAL EXAMINATION:  GENERAL:  The patient is in no distress.  VITAL SIGNS:  Blood pressure 126/73, heart rate 70 and regular, afebrile.  HEENT:  Eyelids unremarkable; pupils are equal, round and reactive to light; fundi within normal limits.  Oral mucosa unremarkable.  NECK:  No jugular venous distention, wave form within normal limits, carotid upstroke brisk and symmetric.  No bruits, no thyromegaly.  LYMPHATICS:  No cervical, axillary or inguinal adenopathy.  LUNGS:  Clear to auscultation bilaterally.  BACK:  No costovertebral angle tenderness.  CHEST:  Unremarkable.  HEART:  PMI not displaced or sustained.  S1 and S2 within normal limits.  No S3, positive S4, no murmurs.  ABDOMEN:  Flat.  Positive bowel sounds, normal in frequency and pitch.  No bruits, rebound, guarding, midline pulsatile mass, hepatomegaly, splenomegaly.  SKIN:  No rashes, no nodules.  EXTREMITIES:  Pulses 2+ throughout.  No edema, no cyanosis, no clubbing.  NEUROLOGIC:  Oriented to person, place and time.  Cranial nerves II-XII grossly intact.  Motor grossly intact throughout.  LABORATORY AND ACCESSORY DATA:  EKG:  Sinus rhythm, left bundle branch block. No old EKGs for comparison.  Labs:  WBC 6.2, hemoglobin 15.7, hematocrit 44.4,  platelets 223,000; sodium 137, potassium 5.0, chloride 103, CO2 28, glucose 111, BUN 17, creatinine 0.9; AST 37, ALT 23, alkaline phosphatase 63, total bilirubin 1.7; CK 91, MB 1.0, INR 1.0.  ASSESSMENT AND PLAN: 1. Chest discomfort:  The patients chest discomfort is worrisome for    new-onset angina (unstable angina).  At this point, we need to rule out    myocardial infarction.  We will manage with beta blockers, Lovenox, aspirin    and plan cardiac catheterization electively. 2. Questionable amaurosis fugax:  This is a worrisome symptom, although it has    been going on for several years without a resultant cerebrovascular    accident.  We will start with a cardiac echo and carotid ultrasounds.  We    may need to consider CT scan plus-or-minus neurology consult.  He will be    started on aspirin while this workup is ongoing. 3. Hypertension:  The patients blood pressure may have been elevated for    longer than he suspected, given his S4 and left bundle branch block.  We    will look for left ventricular function on his electrocardiogram.  He will    continue with current medications. 4. Risk reduction:  We will check his lipid profile performed by Dr. Wylene Simmer.    Of course, he will need a statin if he is found to have coronary disease. Dictated by:   Rollene Rotunda, M.D. [facility] Attending Physician:  Rollene Rotunda DD:  03/16/02 TD:  03/17/02 Job: [id number]

## 2011-04-03 NOTE — Cardiovascular Report (Signed)
[place]. [facility]  Patient:    Jeremiah Zimmerman, Jeremiah Zimmerman Visit Number: [id number] MRN: [id number]          Service Type: MED Location: 3700 [phone] Attending Physician:  Rollene Rotunda Dictated by:   Daisey Must, M.D. [facility] Proc. Date: 03/20/02 Admit Date:  03/16/2002   CC:         Gaspar Garbe, M.D.  Rollene Rotunda, M.D. [facility]  Cardiac Catheterization Lab   Cardiac Catheterization  PROCEDURES PERFORMED:  Left heart catheterization with coronary angiography and left ventriculography.  INDICATIONS:  Mr. Ceesay is a 77 year old male admitted with symptoms of chest pain and dyspnea.  An echocardiogram showed mildly decreased left ventricular systolic function.  He was thus referred for cardiac catheterization.  PROCEDURAL NOTE:  A #6 French sheath was placed in the right femoral artery. Standard Judkins #6 French catheters were utilized.  There was mild catheter-related spasm in the proximal right coronary artery which was relieved with intracoronary nitroglycerin.  Contrast was Omnipaque.  There were no complications.  RESULTS:  HEMODYNAMICS: 1. Left ventricular pressure 110/6. 2. Aortic pressure 120/74. 3. There was no aortic valve gradient.  LEFT VENTRICULOGRAM:  There is moderate global hypokinesis of the left ventricle.  Ejection fraction is estimated at 45%.  There is mild mitral valve prolapse with trace mitral regurgitation.  CORONARY ARTERIOGRAPHY (right dominant): 1. The left main has a mid 30% stenosis.  2. The left anterior descending artery has a 40% stenosis at its origin.    There is a 20% stenosis in the mid vessel.  The LAD gives rise to a small    first diagonal and large second diagonal.  3. The left circumflex gives rise to a normal sized OM-1, normal sized OM-2,    and small third and fourth marginal branches.  The left circumflex is    normal.  4. The right coronary artery had mild catheter-induced spasm in the  proximal    vessel which was relieved with intracoronary nitroglycerin.  The right    coronary artery is otherwise angiographically normal.  It gives rise to a    large posterior descending and small posterolateral branch.  IMPRESSIONS: 1. Mildly decreased left ventricular systolic function secondary to what    appears to be nonischemic cardiomyopathy. 2. No obstructive coronary artery disease identified. Dictated by:   Daisey Must, M.D. [facility] Attending Physician:  Rollene Rotunda DD:  03/20/02 TD:  03/20/02 Job: [id number]

## 2011-04-03 NOTE — Discharge Summary (Signed)
[place]. [facility]  Patient:    Jeremiah Zimmerman, STEFAN Visit Number: [id number] MRN: [id number]          Service Type: MED Location: [phone] Attending Physician:  Rollene Rotunda Dictated by:   Lavella Hammock, P.A. Admit Date:  03/16/2002 Disc. Date: 03/20/02   CC:         Marlan Palau, M.D.  Gaspar Garbe, M.D.   Referring Physician Discharge [facility]  DATE OF BIRTH:  [birth year]  PROCEDURES: 1. Cardiac catheterization. 2. Coronary arteriogram. 3. Left ventriculogram. 4. MRI/MRA of the brain. 5. Carotid Dopplers.  HOSPITAL COURSE:  Mr. Bangura is a 77 year old male with normal coronary arteries by cardiac catheterization in 1992 who was admitted on Mar 16, 2002 for chest pain.  The symptoms began with exertion and included diaphoresis, chest pain, and nausea.  He also describes an episode of visual loss on the Monday before admission.  In retrospect, his symptoms were consistent with amaurosis fugax for the past few years but he denied reporting this to his physician.  He was admitted to rule out MI and for further evaluation.  Because of the possibility of amaurosis fugax, a neuro consult was called.  He was seen by Dr. Anne Hahn who felt that his neurologic examination was normal at the time he was seen.  He recommended an MRI and an MRI angiogram of the intracranial vessels.  If any of the above studies were positive, antiplatelet agents would be recommended.  The patient had an MRI and a MRA of the brain as well as bilateral carotid Dopplers.  He had no significant ICA stenosis with antegrade flow bilaterally in the vertebral arteries.  Additionally he had an MRI and MRA of the brain and the typed report is not available at this time, but according to Dr. Anne Hahn it appeared normal.  No further workup is planned.  The patients enzymes were negative for MI and he had a cardiac catheterization on Mar 20, 2002.  The cardiac catheterization  showed a 30% left main, a 40% and then a 20% LAD lesion.  There was no other significant stenosis.  His EF was 45% with moderate global hypokinesis and trace MR.  It was recommended that his Dyazide be discontinued and that he be started on Altace.  Follow-up with Dr. Antoine Poche also was recommended.  Post cardiac catheterization the patient had no further episodes of chest pain and was ambulating without difficulty.  He was considered stable for discharge on Mar 20, 2002.  LABORATORY VALUES:  TSH 1.111.  Serial CK-MB and troponin I within normal limits.  Sodium 140, potassium 3.8, chloride 107, CO2 27, BUN 14, creatinine 1.0, glucose 111.  Hemoglobin 14.2, hematocrit 40.4, wbcs 7.7, platelets 194.  Chest x-ray:  No acute disease.  DISCHARGE CONDITION:  Stable.  CONSULTS:  Dr. Lesia Sago with neurology.  DISCHARGE DIAGNOSES:  1. Chest pain, no critical coronary artery disease by catheterization.  2. Left ventricular dysfunction with no significant wall motion     abnormalities, ejection fraction 45% by catheterization this admission.  3. History of visual field defects without significant etiology per     neurology.  4. Hypertension.  5. History of migraine headaches.  6. Status post appendectomy and tonsillectomy.  7. History of goiter with normal TSH this admission.  8. History of recently-diagnosed hypertension.  9. History of spinal fracture. 10. History of mitral valve prolapse without significant mitral regurgitation     by catheterization. 11.  History of ALLERGIES to PENICILLIN and SULFA DRUGS. 12. History of hypotension secondary to nitroglycerin.  DISCHARGE INSTRUCTIONS:  ACTIVITY:  His activity level is to include no driving, sexual, or strenuous activity for two days.  DIET:  He is to stick to a low fat diet.  WOUND CARE:  He is to call the office for problems with the catheterization site.  FOLLOW-UP:  He is to see the P.A. for Dr. Antoine Poche on Apr 07, 2002 at  10 a.m. He is to see Dr. Wylene Simmer as needed.  DISCHARGE MEDICATIONS:  1. Aspirin 81 mg q.d.  2. Maxzide 37.5/25 mg q.d.  3. Altace 2.5 mg q.d.  (Will obtain final copy of the MRI/MRA report and include it in the office records.) Dictated by:   Lavella Hammock, P.A. Attending Physician:  Rollene Rotunda DD:  03/20/02 TD:  03/20/02 Job: [id number]
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
## 2011-07-30 ENCOUNTER — Telehealth: Payer: Self-pay | Admitting: *Deleted

## 2011-07-30 NOTE — Telephone Encounter (Signed)
Colon scheduled for 09/10/11 and Pre-visit 09/01/11 11:00 am.  All information mailed to patient.

## 2011-08-11 LAB — DIFFERENTIAL
Basophils Absolute: 0
Lymphocytes Relative: 21
Lymphs Abs: 1.6
Neutro Abs: 4.9
Neutrophils Relative %: 66

## 2011-08-11 LAB — POCT I-STAT, CHEM 8
BUN: 13
Calcium, Ion: 1.17
Chloride: 104
Creatinine, Ser: 1
Glucose, Bld: 117 — ABNORMAL HIGH
HCT: 43
Hemoglobin: 14.6
Potassium: 4.4
Sodium: 139
TCO2: 25

## 2011-08-11 LAB — POCT CARDIAC MARKERS
CKMB, poc: <1 — ABNORMAL LOW
Myoglobin, poc: 63.6
Operator id: 285841
Troponin i, poc: <0.05

## 2011-08-11 LAB — CBC
Platelets: 177
WBC: 7.5

## 2011-09-01 ENCOUNTER — Ambulatory Visit (AMBULATORY_SURGERY_CENTER): Payer: Medicare Other

## 2011-09-01 ENCOUNTER — Encounter: Payer: Self-pay | Admitting: Internal Medicine

## 2011-09-01 VITALS — Ht 70.0 in | Wt 167.4 lb

## 2011-09-01 DIAGNOSIS — Z86010 Personal history of colonic polyps: Secondary | ICD-10-CM

## 2011-09-01 DIAGNOSIS — Z800 Family history of malignant neoplasm of digestive organs: Secondary | ICD-10-CM

## 2011-09-01 MED ORDER — PEG-KCL-NACL-NASULF-NA ASC-C 100 G PO SOLR: 1.0000 | Freq: Once | ORAL | Status: AC

## 2011-09-10 ENCOUNTER — Ambulatory Visit (AMBULATORY_SURGERY_CENTER): Payer: Medicare Other | Admitting: Internal Medicine

## 2011-09-10 ENCOUNTER — Encounter: Payer: Self-pay | Admitting: Internal Medicine

## 2011-09-10 VITALS — BP 168/80 | HR 59 | Temp 97.5°F | Resp 18 | Ht 70.0 in | Wt 167.0 lb

## 2011-09-10 DIAGNOSIS — Z1211 Encounter for screening for malignant neoplasm of colon: Secondary | ICD-10-CM

## 2011-09-10 DIAGNOSIS — D126 Benign neoplasm of colon, unspecified: Secondary | ICD-10-CM

## 2011-09-10 DIAGNOSIS — D1330 Benign neoplasm of unspecified part of small intestine: Secondary | ICD-10-CM

## 2011-09-10 DIAGNOSIS — Z86010 Personal history of colonic polyps: Secondary | ICD-10-CM

## 2011-09-10 MED ORDER — SODIUM CHLORIDE 0.9 % IV SOLN: 500.0000 mL | INTRAVENOUS | Status: DC

## 2011-09-10 NOTE — Patient Instructions (Addendum)
FOLLOW THE DISCHARGE INSTRUCTIONS ON YOUR GREEN AND BLUE INSTRUCTION SHEETS.  CONTINUE YOUR MEDICATIONS. HOLD YOUR ASPIRIN FOR TWO WEEKS UNTIL November 8,2012. AVOID NSAIDS- ADVIL, ALEVE, IBUPROFEN ETC.  AWAIT PATHOLOGY RESULTS. NEXT COLONOSCOPY IN 3 TO 6 months DEPENDENT ON PATHOLOGY RESULTS. NEXT COLONOSCOPY WITH PROPOFOL.

## 2011-09-11 ENCOUNTER — Telehealth: Payer: Self-pay

## 2011-09-11 NOTE — Telephone Encounter (Signed)
No on answered phone.

## 2011-10-30 ENCOUNTER — Encounter: Payer: Self-pay | Admitting: Internal Medicine

## 2011-12-01 ENCOUNTER — Encounter: Payer: Self-pay | Admitting: Internal Medicine

## 2011-12-01 ENCOUNTER — Ambulatory Visit (AMBULATORY_SURGERY_CENTER): Payer: Medicare Other | Admitting: *Deleted

## 2011-12-01 VITALS — Ht 70.0 in | Wt 169.0 lb

## 2011-12-01 DIAGNOSIS — Z1211 Encounter for screening for malignant neoplasm of colon: Secondary | ICD-10-CM

## 2011-12-01 DIAGNOSIS — D126 Benign neoplasm of colon, unspecified: Secondary | ICD-10-CM

## 2011-12-01 MED ORDER — PEG-KCL-NACL-NASULF-NA ASC-C 100 G PO SOLR: ORAL | Status: DC

## 2011-12-15 ENCOUNTER — Encounter: Payer: Self-pay | Admitting: Internal Medicine

## 2011-12-15 ENCOUNTER — Ambulatory Visit (AMBULATORY_SURGERY_CENTER): Payer: Medicare Other | Admitting: Internal Medicine

## 2011-12-15 VITALS — BP 152/81 | HR 63 | Temp 97.5°F | Resp 17 | Ht 70.0 in | Wt 169.0 lb

## 2011-12-15 DIAGNOSIS — D126 Benign neoplasm of colon, unspecified: Secondary | ICD-10-CM

## 2011-12-15 DIAGNOSIS — Z800 Family history of malignant neoplasm of digestive organs: Secondary | ICD-10-CM

## 2011-12-15 DIAGNOSIS — Z86010 Personal history of colonic polyps: Secondary | ICD-10-CM

## 2011-12-15 DIAGNOSIS — Z1211 Encounter for screening for malignant neoplasm of colon: Secondary | ICD-10-CM

## 2011-12-15 MED ORDER — SODIUM CHLORIDE 0.9 % IV SOLN: 500.0000 mL | INTRAVENOUS | Status: DC

## 2011-12-15 NOTE — Op Note (Signed)
[facility] [address] [place], [place]  [address]  COLONOSCOPY PROCEDURE REPORT  PATIENT:  Jeremiah, Zimmerman  MR#:  [id number] BIRTHDATE:  12-08-1933, 78 yrs. old  GENDER:  male ENDOSCOPIST:  Wilhemina Bonito. Eda Keys, MD REF. BY:  Surveillance Program Recall, PROCEDURE DATE:  12/15/2011 PROCEDURE:  Colonoscopy with snare polypectomy x 2 ASA CLASS:  Class II INDICATIONS:  history of pre-cancerous (adenomatous) colon polyps, surveillance and high-risk screening, family history of colon cancer ; index exam 04-2005 w/ TA; brother at 92 MEDICATIONS:   MAC sedation, administered by CRNA, propofol (Diprivan) 250 mg IV  DESCRIPTION OF PROCEDURE:   After the risks benefits and alternatives of the procedure were thoroughly explained, informed consent was obtained.  Digital rectal exam was performed and revealed no abnormalities.   The LB 180AL [id number] endoscope was introduced through the anus and advanced to the cecum, which was identified by both the appendix and ileocecal valve, without limitations.  The quality of the prep was good, using MoviPrep. The instrument was then slowly withdrawn as the colon was fully examined. <<PROCEDUREIMAGES>>  FINDINGS:  Two diminutive polyps were found in the cecum ans sigmoid colon respectively. Polyps were snared without cautery. Retrieval was successful. Otherwise normal colonoscopy without other polyps, masses, vascular ectasias, or inflammatory changes. Retroflexed views in the rectum revealed no abnormalities.  The time to cecum = 5:09  minutes. The scope was then withdrawn in 11:52  minutes from the cecum and the procedure completed.  COMPLICATIONS:  None  ENDOSCOPIC IMPRESSION: 1) Two small polyps - removed 2) Otherwise normal colonoscopy  RECOMMENDATIONS: 1) Follow up colonoscopy in 5 years  ______________________________ Wilhemina Bonito. Eda Keys, MD  CC:  Creola Corn, MD; The Patient  n. eSIGNED:   Wilhemina Bonito. Eda Keys at 12/15/2011 11:18  AM  Mariah Milling, [id number]

## 2011-12-15 NOTE — Progress Notes (Signed)
Pressure applied to the abdomen to reach the cecum 

## 2011-12-15 NOTE — Patient Instructions (Signed)
Discharge instructions given with verbal understanding. Handout on polyps given. Resume previous medications. 

## 2011-12-15 NOTE — Progress Notes (Signed)
Patient did not experience any of the following events: a burn prior to discharge; a fall within the facility; wrong site/side/patient/procedure/implant event; or a hospital transfer or hospital admission upon discharge from the facility. (G8907) Patient did not have preoperative order for IV antibiotic SSI prophylaxis. (G8918)  

## 2011-12-16 ENCOUNTER — Telehealth: Payer: Self-pay

## 2011-12-16 NOTE — Telephone Encounter (Signed)
  Follow up Call-  Call back number 12/15/2011 09/10/2011  Post procedure Call Back phone  # [phone]  Permission to leave phone message Yes -     Patient questions:  Do you have a fever, pain , or abdominal swelling? no Pain Score  0 *  Have you tolerated food without any problems? yes  Have you been able to return to your normal activities? yes  Do you have any questions about your discharge instructions: Diet   no Medications  no Follow up visit  no  Do you have questions or concerns about your Care? no  Actions: * If pain score is 4 or above: No action needed, pain <4.

## 2011-12-22 ENCOUNTER — Encounter: Payer: Self-pay | Admitting: Internal Medicine

## 2013-01-11 NOTE — Progress Notes (Signed)
Jeremiah Gower-  When you get a chance- need PRE OP ORDERS-  PSt appt 01/13/13  Thanks much

## 2013-01-12 ENCOUNTER — Encounter (HOSPITAL_COMMUNITY): Payer: Self-pay | Admitting: Pharmacy Technician

## 2013-01-13 ENCOUNTER — Inpatient Hospital Stay (HOSPITAL_COMMUNITY): Admission: RE | Admit: 2013-01-13 | Payer: Medicare Other | Source: Ambulatory Visit

## 2013-01-13 ENCOUNTER — Other Ambulatory Visit: Payer: Self-pay | Admitting: Orthopedic Surgery

## 2013-01-13 ENCOUNTER — Encounter (HOSPITAL_COMMUNITY): Payer: Self-pay

## 2013-01-13 ENCOUNTER — Encounter (HOSPITAL_COMMUNITY)
Admission: RE | Admit: 2013-01-13 | Discharge: 2013-01-13 | Disposition: A | Discharge status: Home / Self Care | Payer: Medicare Other | Source: Ambulatory Visit | Attending: Orthopedic Surgery | Admitting: Orthopedic Surgery

## 2013-01-13 DIAGNOSIS — J302 Other seasonal allergic rhinitis: Secondary | ICD-10-CM

## 2013-01-13 DIAGNOSIS — I341 Nonrheumatic mitral (valve) prolapse: Secondary | ICD-10-CM

## 2013-01-13 HISTORY — DX: Nonrheumatic mitral (valve) prolapse: I34.1

## 2013-01-13 HISTORY — DX: Cardiac murmur, unspecified: R01.1

## 2013-01-13 HISTORY — DX: Other seasonal allergic rhinitis: J30.2

## 2013-01-13 HISTORY — PX: COLONOSCOPY: SHX174

## 2013-01-13 HISTORY — PX: CARDIAC CATHETERIZATION: SHX172

## 2013-01-13 LAB — CBC
HCT: 44.2 % (ref 39.0–52.0)
MCHC: 33.3 g/dL (ref 30.0–36.0)
MCV: 92.9 fL (ref 78.0–100.0)
Platelets: 137 10*3/uL — ABNORMAL LOW (ref 150–400)
RDW: 12.7 % (ref 11.5–15.5)

## 2013-01-13 MED ORDER — DEXAMETHASONE SODIUM PHOSPHATE 10 MG/ML IJ SOLN: 10.0000 mg | Freq: Once | INTRAMUSCULAR | Status: DC

## 2013-01-13 NOTE — Pre-Procedure Instructions (Signed)
01-13-13 EKG done.

## 2013-01-13 NOTE — Patient Instructions (Signed)
20 Jeremiah Zimmerman  01/13/2013   Your procedure is scheduled on:   01-20-2013  Report to [facility] at      1:00    PM.  Call this number if you have problems the morning of surgery: [phone]  Or Presurgical Testing [phone](Tony Friscia)      Do not eat food:After Midnight.  May have clear liquids:up to 6 Hours before arrival. Nothing after :1000 AM  Clear liquids include soda, tea, black coffee, apple or grape juice, broth.  Take these medicines the morning of surgery with A SIP OF WATER: None Am of surgery   Do not wear jewelry, make-up or nail polish.  Do not wear lotions, powders, or perfumes. You may wear deodorant.  Do not shave 12 hours prior to first CHG shower(legs and under arms).(face and neck okay.)  Do not bring valuables to the hospital.  Contacts, dentures or bridgework,body piercing,  may not be worn into surgery.  Leave suitcase in the car. After surgery it may be brought to your room.  For patients admitted to the hospital, checkout time is 11:00 AM the day of discharge.   Patients discharged the day of surgery will not be allowed to drive home. Must have responsible person with you x 24 hours once discharged.  Name and phone number of your driver: Darrel Reach - daughter [phone] h/ [phone] cell  Special Instructions: CHG(Chlorhedine 4%-"Hibiclens","Betasept","Aplicare") Shower Use Special Wash: see special instructions.(avoid face and genitals)   Please read over the following fact sheets that you were given: MRSA Information, Blood Transfusion fact sheet, Incentive Spirometry Instruction.    Failure to follow these instructions may result in Cancellation of your surgery.   Patient signature_______________________________________________________

## 2013-01-13 NOTE — Progress Notes (Signed)
Preoperative surgical orders have been place into the [facility] hospital system for Jeremiah Zimmerman on 01/13/2013, 1:51 PM  by Patrica Duel for surgery on 01/20/2013.  Preop Knee Scope orders including IV Tylenol and IV Decadron as long as there are no contraindications to the above medications. Avel Peace, PA-C

## 2013-01-19 NOTE — H&P (Signed)
  CC- Jeremiah Zimmerman is a 79 y.o. male who presents with right knee pain.  HPI- . Knee Pain: Patient presents with knee pain involving the  right knee. Onset of the symptoms was several months ago. Inciting event: none known. Current symptoms include giving out, pain located medially and stiffness. Pain is aggravated by pivoting, rising after sitting and squatting.  Patient has had prior knee problems. Evaluation to date: MRI: abnormal medial meniscal tear. Treatment to date: corticosteroid injection which was not very effective.  Past Medical History  Diagnosis Date  . BPPV (benign paroxysmal positional vertigo)   . MVP (mitral valve prolapse) 01-13-13    hx. LBBB previous EKG 11'11  in [facility].  Marland Kitchen Hyperlipidemia   . Bladder cancer 2010  . Adenomatous colon polyp   . Heart murmur   . Seasonal allergies 01-13-13    Past Surgical History  Procedure Laterality Date  . Appendectomy    . Tonsillectomy and adenoidectomy    . Bladder surgery  2010    bladder cancer in 2010/Post surgery bleed  . Colonoscopy  01-13-13    hx.colon polyps excised-last 3-4 yrs ago  . Elbow surgery      left  . Cardiac catheterization  01-13-13    greater than 5 yrs ago-negative findings    Prior to Admission medications   Medication Sig Start Date End Date Taking? Authorizing Provider  aspirin 81 MG tablet Take 81 mg by mouth daily.      Historical Provider, MD  Multiple Vitamins-Minerals (PA MENS 50 PLUS VITAPAK PO) Take by mouth daily.      Historical Provider, MD  naproxen sodium (ANAPROX) 220 MG tablet Take 220 mg by mouth 2 (two) times daily as needed (for pain).    Historical Provider, MD  Tamsulosin HCl (FLOMAX) 0.4 MG CAPS Take 0.4 mg by mouth daily.     Historical Provider, MD   KNEE EXAM antalgic gait, soft tissue tenderness over medial joint line, collateral ligaments intact, normal ipsilateral hip exam  Physical Examination: General appearance - alert, well appearing, and in no distress Mental  status - alert, oriented to person, place, and time Chest - clear to auscultation, no wheezes, rales or rhonchi, symmetric air entry Heart - normal rate, regular rhythm, normal S1, S2, no murmurs, rubs, clicks or gallops Abdomen - soft, nontender, nondistended, no masses or organomegaly Neurological - alert, oriented, normal speech, no focal findings or movement disorder noted   Asessment/Plan--- Right knee medial meniscal tear- - Plan Right knee arthroscopy with meniscal debridement. Procedure risks and potential comps discussed with patient who elects to proceed. Goals are decreased pain and increased function with a high likelihood of achieving both

## 2013-01-20 ENCOUNTER — Ambulatory Visit (HOSPITAL_COMMUNITY): Payer: Medicare Other | Admitting: Anesthesiology

## 2013-01-20 ENCOUNTER — Encounter (HOSPITAL_COMMUNITY)
Admission: RE | Disposition: A | Discharge status: Home / Self Care | Payer: Self-pay | Source: Ambulatory Visit | Attending: Orthopedic Surgery

## 2013-01-20 ENCOUNTER — Encounter (HOSPITAL_COMMUNITY): Payer: Self-pay | Admitting: *Deleted

## 2013-01-20 ENCOUNTER — Encounter (HOSPITAL_COMMUNITY): Payer: Self-pay | Admitting: Anesthesiology

## 2013-01-20 ENCOUNTER — Ambulatory Visit (HOSPITAL_COMMUNITY)
Admission: RE | Admit: 2013-01-20 | Discharge: 2013-01-20 | Disposition: A | Discharge status: Home / Self Care | Payer: Medicare Other | Source: Ambulatory Visit | Attending: Orthopedic Surgery | Admitting: Orthopedic Surgery

## 2013-01-20 DIAGNOSIS — X58XXXA Exposure to other specified factors, initial encounter: Secondary | ICD-10-CM | POA: Insufficient documentation

## 2013-01-20 DIAGNOSIS — Z01810 Encounter for preprocedural cardiovascular examination: Secondary | ICD-10-CM | POA: Insufficient documentation

## 2013-01-20 DIAGNOSIS — Z79899 Other long term (current) drug therapy: Secondary | ICD-10-CM | POA: Insufficient documentation

## 2013-01-20 DIAGNOSIS — IMO0002 Reserved for concepts with insufficient information to code with codable children: Secondary | ICD-10-CM | POA: Insufficient documentation

## 2013-01-20 DIAGNOSIS — Z7982 Long term (current) use of aspirin: Secondary | ICD-10-CM | POA: Insufficient documentation

## 2013-01-20 DIAGNOSIS — E785 Hyperlipidemia, unspecified: Secondary | ICD-10-CM | POA: Insufficient documentation

## 2013-01-20 DIAGNOSIS — I059 Rheumatic mitral valve disease, unspecified: Secondary | ICD-10-CM | POA: Insufficient documentation

## 2013-01-20 DIAGNOSIS — R269 Unspecified abnormalities of gait and mobility: Secondary | ICD-10-CM | POA: Insufficient documentation

## 2013-01-20 DIAGNOSIS — M2240 Chondromalacia patellae, unspecified knee: Secondary | ICD-10-CM | POA: Insufficient documentation

## 2013-01-20 DIAGNOSIS — H8110 Benign paroxysmal vertigo, unspecified ear: Secondary | ICD-10-CM | POA: Insufficient documentation

## 2013-01-20 DIAGNOSIS — C679 Malignant neoplasm of bladder, unspecified: Secondary | ICD-10-CM | POA: Insufficient documentation

## 2013-01-20 DIAGNOSIS — Z01812 Encounter for preprocedural laboratory examination: Secondary | ICD-10-CM | POA: Insufficient documentation

## 2013-01-20 HISTORY — PX: KNEE ARTHROSCOPY WITH MEDIAL MENISECTOMY: SHX5651

## 2013-01-20 SURGERY — ARTHROSCOPY, KNEE, WITH MEDIAL MENISCECTOMY
Anesthesia: General | Site: Knee | Laterality: Right | Wound class: Clean

## 2013-01-20 MED ORDER — HYDROMORPHONE HCL PF 1 MG/ML IJ SOLN
0.2500 mg | INTRAMUSCULAR | Status: DC | PRN
  Administered 2013-01-20 (×2): 0.5 mg via INTRAVENOUS

## 2013-01-20 MED ORDER — SODIUM CHLORIDE 0.9 % IV SOLN: INTRAVENOUS | Status: DC

## 2013-01-20 MED ORDER — FENTANYL CITRATE 0.05 MG/ML IJ SOLN
INTRAMUSCULAR | Status: DC | PRN
  Administered 2013-01-20: 50 ug via INTRAVENOUS
  Administered 2013-01-20 (×2): 25 ug via INTRAVENOUS

## 2013-01-20 MED ORDER — VANCOMYCIN HCL IN DEXTROSE 1-5 GM/200ML-% IV SOLN
1000.0000 mg | Freq: Once | INTRAVENOUS | Status: AC
  Administered 2013-01-20: 1000 mg via INTRAVENOUS

## 2013-01-20 MED ORDER — MEPERIDINE HCL 50 MG/ML IJ SOLN
6.2500 mg | INTRAMUSCULAR | Status: DC | PRN
  Administered 2013-01-20: 12.5 mg via INTRAVENOUS

## 2013-01-20 MED ORDER — CEFAZOLIN SODIUM-DEXTROSE 2-3 GM-% IV SOLR: 2.0000 g | INTRAVENOUS | Status: DC

## 2013-01-20 MED ORDER — LACTATED RINGERS IV SOLN
INTRAVENOUS | Status: DC
  Administered 2013-01-20: 1000 mL via INTRAVENOUS
  Administered 2013-01-20: 17:00:00 via INTRAVENOUS

## 2013-01-20 MED ORDER — CHLORHEXIDINE GLUCONATE 4 % EX LIQD
60.0000 mL | Freq: Once | CUTANEOUS | Status: DC
  Filled 2013-01-20: qty 60

## 2013-01-20 MED ORDER — LACTATED RINGERS IR SOLN
Status: DC | PRN
  Administered 2013-01-20: 6000 mL

## 2013-01-20 MED ORDER — HYDROCODONE-ACETAMINOPHEN 5-325 MG PO TABS: 1.0000 | ORAL_TABLET | Freq: Four times a day (QID) | ORAL | Status: DC | PRN

## 2013-01-20 MED ORDER — BUPIVACAINE-EPINEPHRINE 0.25% -1:200000 IJ SOLN
INTRAMUSCULAR | Status: AC
  Filled 2013-01-20: qty 1

## 2013-01-20 MED ORDER — ACETAMINOPHEN 10 MG/ML IV SOLN
INTRAVENOUS | Status: AC
  Filled 2013-01-20: qty 100

## 2013-01-20 MED ORDER — BUPIVACAINE-EPINEPHRINE 0.25% -1:200000 IJ SOLN
INTRAMUSCULAR | Status: DC | PRN
  Administered 2013-01-20: 30 mL

## 2013-01-20 MED ORDER — VANCOMYCIN HCL IN DEXTROSE 1-5 GM/200ML-% IV SOLN
INTRAVENOUS | Status: AC
  Filled 2013-01-20: qty 200

## 2013-01-20 MED ORDER — ACETAMINOPHEN 10 MG/ML IV SOLN
INTRAVENOUS | Status: DC | PRN
  Administered 2013-01-20: 1000 mg via INTRAVENOUS

## 2013-01-20 MED ORDER — FENTANYL CITRATE 0.05 MG/ML IJ SOLN: 25.0000 ug | INTRAMUSCULAR | Status: DC | PRN

## 2013-01-20 MED ORDER — LACTATED RINGERS IV SOLN: INTRAVENOUS | Status: DC

## 2013-01-20 MED ORDER — ACETAMINOPHEN 10 MG/ML IV SOLN: 1000.0000 mg | Freq: Once | INTRAVENOUS | Status: DC

## 2013-01-20 MED ORDER — METHOCARBAMOL 500 MG PO TABS: 500.0000 mg | ORAL_TABLET | Freq: Four times a day (QID) | ORAL | Status: DC

## 2013-01-20 MED ORDER — MEPERIDINE HCL 50 MG/ML IJ SOLN
INTRAMUSCULAR | Status: AC
  Filled 2013-01-20: qty 1

## 2013-01-20 MED ORDER — HYDROMORPHONE HCL PF 1 MG/ML IJ SOLN
INTRAMUSCULAR | Status: AC
  Filled 2013-01-20: qty 1

## 2013-01-20 MED ORDER — HYDROCODONE-ACETAMINOPHEN 5-325 MG PO TABS
1.0000 | ORAL_TABLET | Freq: Four times a day (QID) | ORAL | Status: DC | PRN
  Administered 2013-01-20: 1 via ORAL

## 2013-01-20 MED ORDER — PROMETHAZINE HCL 25 MG/ML IJ SOLN: 6.2500 mg | INTRAMUSCULAR | Status: DC | PRN

## 2013-01-20 MED ORDER — HYDROCODONE-ACETAMINOPHEN 5-325 MG PO TABS
ORAL_TABLET | ORAL | Status: AC
  Filled 2013-01-20: qty 1

## 2013-01-20 SURGICAL SUPPLY — 28 items
BANDAGE ELASTIC 6 VELCRO ST LF (GAUZE/BANDAGES/DRESSINGS) ×2 IMPLANT
BLADE 4.2CUDA (BLADE) ×2 IMPLANT
CLOTH BEACON ORANGE TIMEOUT ST (SAFETY) ×2 IMPLANT
CUFF TOURN SGL QUICK 34 (TOURNIQUET CUFF) ×1
CUFF TRNQT CYL 34X4X40X1 (TOURNIQUET CUFF) ×1 IMPLANT
DRAPE U-SHAPE 47X51 STRL (DRAPES) ×2 IMPLANT
DRSG EMULSION OIL 3X3 NADH (GAUZE/BANDAGES/DRESSINGS) ×2 IMPLANT
DRSG PAD ABDOMINAL 8X10 ST (GAUZE/BANDAGES/DRESSINGS) ×2 IMPLANT
DURAPREP 26ML APPLICATOR (WOUND CARE) ×2 IMPLANT
GLOVE BIO SURGEON STRL SZ7.5 (GLOVE) ×2 IMPLANT
GLOVE BIO SURGEON STRL SZ8 (GLOVE) ×2 IMPLANT
GLOVE BIOGEL PI IND STRL 8 (GLOVE) ×1 IMPLANT
GLOVE BIOGEL PI INDICATOR 8 (GLOVE) ×1
GOWN STRL NON-REIN LRG LVL3 (GOWN DISPOSABLE) ×4 IMPLANT
MANIFOLD NEPTUNE II (INSTRUMENTS) ×4 IMPLANT
PACK ARTHROSCOPY WL (CUSTOM PROCEDURE TRAY) ×2 IMPLANT
PACK ICE MAXI GEL EZY WRAP (MISCELLANEOUS) ×6 IMPLANT
PADDING CAST ABS 6INX4YD NS (CAST SUPPLIES) ×1
PADDING CAST ABS COTTON 6X4 NS (CAST SUPPLIES) ×1 IMPLANT
PADDING CAST COTTON 6X4 STRL (CAST SUPPLIES) ×2 IMPLANT
POSITIONER SURGICAL ARM (MISCELLANEOUS) ×2 IMPLANT
SET ARTHROSCOPY TUBING (MISCELLANEOUS) ×1
SET ARTHROSCOPY TUBING LN (MISCELLANEOUS) ×1 IMPLANT
SPONGE GAUZE 4X4 12PLY (GAUZE/BANDAGES/DRESSINGS) ×2 IMPLANT
SUT ETHILON 4 0 PS 2 18 (SUTURE) ×2 IMPLANT
TOWEL OR 17X26 10 PK STRL BLUE (TOWEL DISPOSABLE) ×2 IMPLANT
WAND 90 DEG TURBOVAC W/CORD (SURGICAL WAND) ×2 IMPLANT
WRAP KNEE MAXI GEL POST OP (GAUZE/BANDAGES/DRESSINGS) ×4 IMPLANT

## 2013-01-20 NOTE — Transfer of Care (Signed)
Immediate Anesthesia Transfer of Care Note  Patient: Jeremiah Zimmerman  Procedure(s) Performed: Procedure(s) (LRB): KNEE ARTHROSCOPY WITH MEDIAL MENISECTOMY (Right)  Patient Location: PACU  Anesthesia Type: General  Level of Consciousness: sedated, patient cooperative and responds to stimulaton  Airway & Oxygen Therapy: Patient Spontanous Breathing and Patient connected to face mask oxgen  Post-op Assessment: Report given to PACU RN and Post -op Vital signs reviewed and stable  Post vital signs: Reviewed and stable  Complications: No apparent anesthesia complications

## 2013-01-20 NOTE — Anesthesia Preprocedure Evaluation (Signed)
Anesthesia Evaluation  Patient identified by MRN, date of birth, ID band Patient awake    Reviewed: Allergy & Precautions, H&P , NPO status , Patient's Chart, lab work & pertinent test results  Airway Mallampati: III TM Distance: >3 FB Neck ROM: Full    Dental no notable dental hx. (+) Edentulous Upper and Edentulous Lower   Pulmonary neg pulmonary ROS,  breath sounds clear to auscultation  Pulmonary exam normal       Cardiovascular negative cardio ROS  + Valvular Problems/Murmurs MVP Rhythm:Regular Rate:Normal  LBBB    Neuro/Psych negative neurological ROS  negative psych ROS   GI/Hepatic negative GI ROS, Neg liver ROS,   Endo/Other  negative endocrine ROS  Renal/GU negative Renal ROS  negative genitourinary   Musculoskeletal negative musculoskeletal ROS (+)   Abdominal   Peds negative pediatric ROS (+)  Hematology negative hematology ROS (+)   Anesthesia Other Findings   Reproductive/Obstetrics negative OB ROS                           Anesthesia Physical Anesthesia Plan  ASA: II  Anesthesia Plan: General   Post-op Pain Management:    Induction: Intravenous  Airway Management Planned: LMA  Additional Equipment:   Intra-op Plan:   Post-operative Plan: Extubation in OR  Informed Consent: I have reviewed the patients History and Physical, chart, labs and discussed the procedure including the risks, benefits and alternatives for the proposed anesthesia with the patient or authorized representative who has indicated his/her understanding and acceptance.   Dental advisory given  Plan Discussed with: CRNA  Anesthesia Plan Comments:         Anesthesia Quick Evaluation

## 2013-01-20 NOTE — Op Note (Signed)
Preoperative diagnosis-  Right knee medial meniscal tear  Postoperative diagnosis Right- knee medial meniscal tear   Plus Right medial femoral chondral defect  Procedure- Right knee arthroscopy with medial Meniscal debridement and chondroplasty   Surgeon- Gus Rankin. Aluisio, MD  Anesthesia-General  EBL-  minimal Complications- None  Condition- PACU - hemodynamically stable.  Brief clinical note- -Jeremiah Zimmerman is a 79 y.o.  male with a several month history of left knee pain and mechanical symptoms. Exam and history suggested medial meniscal tear confirmed by MRI. The patient presents now for arthroscopy and debridement   Procedure in detail -       After successful administration of General anesthetic, a tourmiquet is placed high on the Right  thigh and the Right lower extremity is prepped and draped in the usual sterile fashion. Time out is performed by the surgical team. Standard superomedial and inferolateral portal sites are marked and incisions made with an 11 blade. The inflow cannula is passed through the superomedial portal and camera through the inferolateral portal and inflow is initiated. Arthroscopic visualization proceeds.      The undersurface of the patella and trochlea are visualized and there is minimal chondromalacia. The medial and lateral gutters are visualized and there is a large loose body that was subsequently removed. Flexion and valgus force is applied to the knee and the medial compartment is entered. A spinal needle is passed into the joint through the site marked for the inferomedial portal. A small incision is made and the dilator passed into the joint. The findings for the medial compartment are posterior horn medial meniscal tear with a 2 x 2 cm area of Grade III chondromalacia which contained an unstable area of cartilage approximately 1 x 1 cm in size. The tear is debrided to a stable base with baskets and a shaver and sealed off with the Arthrocare. The shaver  is used to debride the unstable cartilage to a stable  cartilaginous) base with stable edges. It is probed and found to be stable.    The intercondylar notch is visualized and the ACL appears normal. The lateral compartment is entered and the findings are 1 x 1 cm contained chondral defect which is stable. This probably represented the area from which the loose body arose. It did not require debridement as this was a stable area.     The joint is again inspected and there are no other tears, defects or loose bodies identified. The arthroscopic equipment is then removed from the inferior portals which are closed with interrupted 4-0 nylon. 20 ml of .25% Marcaine with epinephrine are injected through the inflow cannula and the cannula is then removed and the portal closed with nylon. The incisions are cleaned and dried and a bulky sterile dressing is applied. The patient is then awakened and transported to recovery in stable condition.   01/20/2013, 5:44 PM

## 2013-01-20 NOTE — Interval H&P Note (Signed)
History and Physical Interval Note:  01/20/2013 4:54 PM  Jeremiah Zimmerman  has presented today for surgery, with the diagnosis of RIGHT KNEE MEDIAL MENISCUS TEAR  The various methods of treatment have been discussed with the patient and family. After consideration of risks, benefits and other options for treatment, the patient has consented to  Procedure(s): KNEE ARTHROSCOPY WITH MEDIAL MENISECTOMY (Right) as a surgical intervention .  The patient's history has been reviewed, patient examined, no change in status, stable for surgery.  I have reviewed the patient's chart and labs.  Questions were answered to the patient's satisfaction.     Loanne Drilling

## 2013-01-20 NOTE — Anesthesia Postprocedure Evaluation (Signed)
  Anesthesia Post-op Note  Patient: Jeremiah Zimmerman  Procedure(s) Performed: Procedure(s) (LRB): KNEE ARTHROSCOPY WITH MEDIAL MENISECTOMY (Right)  Patient Location: PACU  Anesthesia Type: General  Level of Consciousness: awake and alert   Airway and Oxygen Therapy: Patient Spontanous Breathing  Post-op Pain: mild  Post-op Assessment: Post-op Vital signs reviewed, Patient's Cardiovascular Status Stable, Respiratory Function Stable, Patent Airway and No signs of Nausea or vomiting  Last Vitals:  Filed Vitals:   01/20/13 1825  BP:   Pulse: 59  Temp:   Resp: 17    Post-op Vital Signs: stable   Complications: No apparent anesthesia complications

## 2013-01-23 ENCOUNTER — Encounter (HOSPITAL_COMMUNITY): Payer: Self-pay | Admitting: Orthopedic Surgery

## 2013-11-21 ENCOUNTER — Encounter: Payer: Self-pay | Admitting: Internal Medicine

## 2015-03-12 ENCOUNTER — Encounter: Payer: Self-pay | Admitting: Internal Medicine

## 2016-11-24 ENCOUNTER — Encounter: Payer: Self-pay | Admitting: Internal Medicine

## 2017-01-25 ENCOUNTER — Emergency Department (HOSPITAL_COMMUNITY): Payer: Medicare Other

## 2017-01-25 ENCOUNTER — Encounter (HOSPITAL_COMMUNITY): Payer: Self-pay | Admitting: Emergency Medicine

## 2017-01-25 ENCOUNTER — Observation Stay (HOSPITAL_COMMUNITY)
Admission: EM | Admit: 2017-01-25 | Discharge: 2017-01-28 | Disposition: A | Discharge status: Home / Self Care | Payer: Medicare Other | Attending: Family Medicine | Admitting: Family Medicine

## 2017-01-25 DIAGNOSIS — Z880 Allergy status to penicillin: Secondary | ICD-10-CM | POA: Diagnosis not present

## 2017-01-25 DIAGNOSIS — Z8551 Personal history of malignant neoplasm of bladder: Secondary | ICD-10-CM | POA: Diagnosis not present

## 2017-01-25 DIAGNOSIS — E785 Hyperlipidemia, unspecified: Secondary | ICD-10-CM | POA: Diagnosis not present

## 2017-01-25 DIAGNOSIS — I110 Hypertensive heart disease with heart failure: Secondary | ICD-10-CM | POA: Insufficient documentation

## 2017-01-25 DIAGNOSIS — Z8049 Family history of malignant neoplasm of other genital organs: Secondary | ICD-10-CM | POA: Diagnosis not present

## 2017-01-25 DIAGNOSIS — N400 Enlarged prostate without lower urinary tract symptoms: Secondary | ICD-10-CM | POA: Insufficient documentation

## 2017-01-25 DIAGNOSIS — Z9889 Other specified postprocedural states: Secondary | ICD-10-CM | POA: Diagnosis not present

## 2017-01-25 DIAGNOSIS — I341 Nonrheumatic mitral (valve) prolapse: Secondary | ICD-10-CM | POA: Insufficient documentation

## 2017-01-25 DIAGNOSIS — Z9049 Acquired absence of other specified parts of digestive tract: Secondary | ICD-10-CM | POA: Insufficient documentation

## 2017-01-25 DIAGNOSIS — Z882 Allergy status to sulfonamides status: Secondary | ICD-10-CM | POA: Diagnosis not present

## 2017-01-25 DIAGNOSIS — I351 Nonrheumatic aortic (valve) insufficiency: Secondary | ICD-10-CM | POA: Diagnosis not present

## 2017-01-25 DIAGNOSIS — Z86010 Personal history of colonic polyps: Secondary | ICD-10-CM | POA: Insufficient documentation

## 2017-01-25 DIAGNOSIS — R945 Abnormal results of liver function studies: Secondary | ICD-10-CM

## 2017-01-25 DIAGNOSIS — Z8042 Family history of malignant neoplasm of prostate: Secondary | ICD-10-CM | POA: Insufficient documentation

## 2017-01-25 DIAGNOSIS — Z79899 Other long term (current) drug therapy: Secondary | ICD-10-CM | POA: Insufficient documentation

## 2017-01-25 DIAGNOSIS — I447 Left bundle-branch block, unspecified: Secondary | ICD-10-CM | POA: Diagnosis not present

## 2017-01-25 DIAGNOSIS — Z808 Family history of malignant neoplasm of other organs or systems: Secondary | ICD-10-CM | POA: Diagnosis not present

## 2017-01-25 DIAGNOSIS — R109 Unspecified abdominal pain: Secondary | ICD-10-CM

## 2017-01-25 DIAGNOSIS — Z881 Allergy status to other antibiotic agents status: Secondary | ICD-10-CM | POA: Diagnosis not present

## 2017-01-25 DIAGNOSIS — K8020 Calculus of gallbladder without cholecystitis without obstruction: Secondary | ICD-10-CM | POA: Diagnosis present

## 2017-01-25 DIAGNOSIS — N281 Cyst of kidney, acquired: Secondary | ICD-10-CM | POA: Diagnosis not present

## 2017-01-25 DIAGNOSIS — I5032 Chronic diastolic (congestive) heart failure: Secondary | ICD-10-CM | POA: Insufficient documentation

## 2017-01-25 DIAGNOSIS — C679 Malignant neoplasm of bladder, unspecified: Secondary | ICD-10-CM | POA: Insufficient documentation

## 2017-01-25 DIAGNOSIS — K8080 Other cholelithiasis without obstruction: Principal | ICD-10-CM | POA: Insufficient documentation

## 2017-01-25 DIAGNOSIS — H8110 Benign paroxysmal vertigo, unspecified ear: Secondary | ICD-10-CM | POA: Insufficient documentation

## 2017-01-25 DIAGNOSIS — R1011 Right upper quadrant pain: Secondary | ICD-10-CM | POA: Diagnosis not present

## 2017-01-25 DIAGNOSIS — K760 Fatty (change of) liver, not elsewhere classified: Secondary | ICD-10-CM | POA: Insufficient documentation

## 2017-01-25 DIAGNOSIS — R7989 Other specified abnormal findings of blood chemistry: Secondary | ICD-10-CM | POA: Diagnosis not present

## 2017-01-25 DIAGNOSIS — D696 Thrombocytopenia, unspecified: Secondary | ICD-10-CM | POA: Insufficient documentation

## 2017-01-25 DIAGNOSIS — R17 Unspecified jaundice: Secondary | ICD-10-CM

## 2017-01-25 DIAGNOSIS — I700 Atherosclerosis of aorta: Secondary | ICD-10-CM | POA: Diagnosis not present

## 2017-01-25 DIAGNOSIS — R740 Nonspecific elevation of levels of transaminase and lactic acid dehydrogenase [LDH]: Secondary | ICD-10-CM

## 2017-01-25 DIAGNOSIS — Z803 Family history of malignant neoplasm of breast: Secondary | ICD-10-CM | POA: Insufficient documentation

## 2017-01-25 DIAGNOSIS — I10 Essential (primary) hypertension: Secondary | ICD-10-CM | POA: Diagnosis present

## 2017-01-25 DIAGNOSIS — R7401 Elevation of levels of liver transaminase levels: Secondary | ICD-10-CM

## 2017-01-25 HISTORY — DX: Essential (primary) hypertension: I10

## 2017-01-25 HISTORY — DX: Benign prostatic hyperplasia without lower urinary tract symptoms: N40.0

## 2017-01-25 LAB — COMPREHENSIVE METABOLIC PANEL
ALBUMIN: 3.8 g/dL (ref 3.5–5.0)
ALT: 71 U/L — ABNORMAL HIGH (ref 17–63)
AST: 108 U/L — AB (ref 15–41)
Alkaline Phosphatase: 88 U/L (ref 38–126)
Anion gap: 8 (ref 5–15)
BILIRUBIN TOTAL: 1.7 mg/dL — AB (ref 0.3–1.2)
BUN: 13 mg/dL (ref 6–20)
CO2: 25 mmol/L (ref 22–32)
Calcium: 8.9 mg/dL (ref 8.9–10.3)
Chloride: 104 mmol/L (ref 101–111)
Creatinine, Ser: 0.95 mg/dL (ref 0.61–1.24)
GFR calc Af Amer: >60 mL/min (ref >60)
GFR calc non Af Amer: >60 mL/min (ref >60)
GLUCOSE: 131 mg/dL — AB (ref 65–99)
POTASSIUM: 3.9 mmol/L (ref 3.5–5.1)
SODIUM: 137 mmol/L (ref 135–145)
TOTAL PROTEIN: 6.9 g/dL (ref 6.5–8.1)

## 2017-01-25 LAB — URINALYSIS, ROUTINE W REFLEX MICROSCOPIC
BACTERIA UA: NONE SEEN
BILIRUBIN URINE: NEGATIVE
Glucose, UA: NEGATIVE mg/dL
Hgb urine dipstick: NEGATIVE
Ketones, ur: NEGATIVE mg/dL
NITRITE: NEGATIVE
PH: 7 (ref 5.0–8.0)
Protein, ur: NEGATIVE mg/dL
SPECIFIC GRAVITY, URINE: 1.012 (ref 1.005–1.030)
SQUAMOUS EPITHELIAL / LPF: NONE SEEN

## 2017-01-25 LAB — CBC
HCT: 44.1 % (ref 39.0–52.0)
HEMOGLOBIN: 15 g/dL (ref 13.0–17.0)
MCH: 30.6 pg (ref 26.0–34.0)
MCHC: 34 g/dL (ref 30.0–36.0)
MCV: 90 fL (ref 78.0–100.0)
Platelets: 117 10*3/uL — ABNORMAL LOW (ref 150–400)
RBC: 4.9 MIL/uL (ref 4.22–5.81)
RDW: 12.7 % (ref 11.5–15.5)
WBC: 8 10*3/uL (ref 4.0–10.5)

## 2017-01-25 LAB — LIPASE, BLOOD: Lipase: 17 U/L (ref 11–51)

## 2017-01-25 MED ORDER — ONDANSETRON HCL 4 MG/2ML IJ SOLN
4.0000 mg | Freq: Four times a day (QID) | INTRAMUSCULAR | Status: DC | PRN
  Administered 2017-01-26 – 2017-01-27 (×2): 4 mg via INTRAVENOUS
  Filled 2017-01-25 (×2): qty 2

## 2017-01-25 MED ORDER — TAMSULOSIN HCL 0.4 MG PO CAPS
0.4000 mg | ORAL_CAPSULE | Freq: Every day | ORAL | Status: DC
  Administered 2017-01-26 – 2017-01-28 (×3): 0.4 mg via ORAL
  Filled 2017-01-25 (×3): qty 1

## 2017-01-25 MED ORDER — IOPAMIDOL (ISOVUE-300) INJECTION 61%
INTRAVENOUS | Status: AC
  Administered 2017-01-25: 100 mL
  Filled 2017-01-25: qty 100

## 2017-01-25 MED ORDER — ACETAMINOPHEN 325 MG PO TABS: 650.0000 mg | ORAL_TABLET | Freq: Four times a day (QID) | ORAL | Status: DC | PRN

## 2017-01-25 MED ORDER — SODIUM CHLORIDE 0.9 % IV BOLUS (SEPSIS)
500.0000 mL | Freq: Once | INTRAVENOUS | Status: AC
  Administered 2017-01-25: 500 mL via INTRAVENOUS

## 2017-01-25 MED ORDER — ONDANSETRON HCL 4 MG PO TABS: 4.0000 mg | ORAL_TABLET | Freq: Four times a day (QID) | ORAL | Status: DC | PRN

## 2017-01-25 MED ORDER — ACETAMINOPHEN 650 MG RE SUPP
650.0000 mg | Freq: Four times a day (QID) | RECTAL | Status: DC | PRN
  Administered 2017-01-26: 650 mg via RECTAL
  Filled 2017-01-25: qty 1

## 2017-01-25 MED ORDER — ENOXAPARIN SODIUM 40 MG/0.4ML ~~LOC~~ SOLN
40.0000 mg | Freq: Every day | SUBCUTANEOUS | Status: DC
  Administered 2017-01-25 – 2017-01-27 (×3): 40 mg via SUBCUTANEOUS
  Filled 2017-01-25 (×3): qty 0.4

## 2017-01-25 MED ORDER — IBUPROFEN 200 MG PO TABS
400.0000 mg | ORAL_TABLET | Freq: Four times a day (QID) | ORAL | Status: DC | PRN
  Administered 2017-01-26: 400 mg via ORAL
  Filled 2017-01-25: qty 2

## 2017-01-25 NOTE — H&P (Signed)
History and Physical  Patient Name: Jeremiah Zimmerman     DVV:[id number]    DOB: 1934/08/26    DOA: 01/25/2017 PCP: Precious Reel, MD   Patient coming from: Home  Chief Complaint: Right abdominal pain  HPI: Jeremiah Zimmerman is a 83 y.o. male with a past medical history significant for hypertension, BPH and remote bladder cancer (didn't need bladder resection) who presents with 1 day intermittent right sided abdominal pain.  The patient was in his usual state of health until about 11AM this morning, when he had onset of severe RUQ abdominal pain, radiating to the epigastrium.  This lasted about 20 minutes then resolved spontaneously.  He tried eating soup around 2PM but after 2 bites the pain returned again for about 20 minutes, with nausea, and he stopped.  Some time after that he came tot he ER.  He has never had similar pain.  He had no fever, chills, vomiting.  He has had no diarrhea, hematochezia, melena.    ED course: -Afebrile, heart rate 83, respirations pulse ox normal, blood pressure 161/66 -Na 137, K 3.9, Cr 0.95, WBC 8.0K, Hgb 15 -AST/ALT 108/71 and TBili 1.7 -Lipase normal -UA negative without hematuria -CT abdomen showed no pancreatitis, SBO, gallbladder pathology, renal defects, nephroliths, nor any other acute finding -RUQ US showed CBD 9mm, no cholecystitis and negative sonographic murphy's but did show gallstones and sludge -TRH were asked to observe for abnormal LFTs         ROS: Review of Systems  Constitutional: Negative for chills, diaphoresis, fever and malaise/fatigue.  Gastrointestinal: Positive for abdominal pain and nausea. Negative for blood in stool, constipation, diarrhea, heartburn, melena and vomiting.  Genitourinary: Negative for dysuria.  Neurological: Negative for weakness.  All other systems reviewed and are negative.         Past Medical History:  Diagnosis Date  . Adenomatous colon polyp   . Bladder cancer ([place]) 2010  . BPPV (benign  paroxysmal positional vertigo)   . Heart murmur   . Hyperlipidemia   . MVP (mitral valve prolapse) 01-13-13   hx. LBBB previous EKG 11'11  in Epic.  . Seasonal allergies 01-13-13    Past Surgical History:  Procedure Laterality Date  . APPENDECTOMY    . BLADDER SURGERY  2010   bladder cancer in 2010/Post surgery bleed  . CARDIAC CATHETERIZATION  01-13-13   greater than 5 yrs ago-negative findings  . COLONOSCOPY  01-13-13   hx.colon polyps excised-last 3-4 yrs ago  . ELBOW SURGERY     left  . KNEE ARTHROSCOPY WITH MEDIAL MENISECTOMY Right 01/20/2013   Procedure: KNEE ARTHROSCOPY WITH MEDIAL MENISECTOMY;  Surgeon: Gearlean Alf, MD;  Location: [facility];  Service: Orthopedics;  Laterality: Right;  right knee arthroscopy with medial meniscal debridement and chrondroplasty  . TONSILLECTOMY AND ADENOIDECTOMY      Social History: Patient lives with his wife.  The patient walks unassisted, but with a cane for long distances.  Still drives.  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 Non-smoker.    Allergies  Allergen Reactions  . Erythromycin Nausea Only    REACTION: Intolerance to this medication with upset stomach  . Nitroglycerin Other (See Comments)    REACTION: Intolerance to this medication.  It drops his BP too low and quickly.  . Sulfa Antibiotics Nausea Only and Rash  . Tetracyclines & Related Nausea Only  . Amoxicillin Diarrhea and Nausea Only  . Penicillins Swelling and Rash    REACTION:  Patient develops a rash and swelling at injection site    Family history: family history includes Breast cancer in his mother; Cervical cancer in his daughter; Colon cancer in his brother; Prostate cancer in his brother and father; Thyroid cancer in his daughter; Uterine cancer in his daughter.  Prior to Admission medications   Medication Sig Start Date End Date Taking? Authorizing Provider  Multiple Vitamins-Minerals (PA MENS 50 PLUS VITAPAK PO) Take by mouth daily.     Yes Historical  Provider, MD  Tamsulosin HCl (FLOMAX) 0.4 MG CAPS Take 0.4 mg by mouth daily.    Yes Historical Provider, MD  triamcinolone cream (KENALOG) 0.1 % Apply 1 application topically 2 (two) times daily as needed for irritation. 01/16/17  Yes Historical Provider, MD       Physical Exam: BP 149/75 (BP Location: Right Arm)   Pulse 71   Temp 97.6 F (36.4 C) (Oral)   Resp 19   Ht 5\' 10"  (1.778 m)   Wt 77.1 kg (170 lb)   SpO2 96%   BMI 24.39 kg/m  General appearance: Well-developed, elderly adult male, alert and in no acute distress.   Eyes: Anicteric, conjunctiva pink, lids and lashes normal. PERRL.    ENT: No nasal deformity, discharge, epistaxis.  Hearing normal. OP moist without lesions.   Neck: No neck masses.  Trachea midline.  No thyromegaly/tenderness. Lymph: No cervical or supraclavicular lymphadenopathy. Skin: Warm and dry.  No jaundice.  No suspicious rashes or lesions. Cardiac: RRR, nl S1-S2, no murmurs appreciated.  Capillary refill is brisk.  JVP normal.  No LE edema.  Radial and DP pulses 2+ and symmetric. Respiratory: Normal respiratory rate and rhythm.  CTAB without rales or wheezes. Abdomen: Abdomen soft.  Mild Right sided TTP without rebound, guarding.  No Murphy's sign.  No rigidity. No ascites, distension, hepatosplenomegaly.   MSK: No deformities or effusions.  No cyanosis or clubbing. Neuro: Cranial nerves normal.  Sensation intact to light touch. Speech is fluent.  Muscle strength normal.    Psych: Sensorium intact and responding to questions, attention normal.  Behavior appropriate.  Affect normal.  Judgment and insight appear normal.     Labs on Admission:  I have personally reviewed following labs and imaging studies: CBC:  Recent Labs Lab 01/25/17 1730  WBC 8.0  HGB 15.0  HCT 44.1  MCV 90.0  PLT 734*   Basic Metabolic Panel:  Recent Labs Lab 01/25/17 1730  NA 137  K 3.9  CL 104  CO2 25  GLUCOSE 131*  BUN 13  CREATININE 0.95  CALCIUM 8.9    GFR: Estimated Creatinine Clearance: 63 mL/min (by C-G formula based on SCr of 0.95 mg/dL).  Liver Function Tests:  Recent Labs Lab 01/25/17 1730  AST 108*  ALT 71*  ALKPHOS 88  BILITOT 1.7*  PROT 6.9  ALBUMIN 3.8    Recent Labs Lab 01/25/17 1730  LIPASE 17   No results for input(s): AMMONIA in the last 168 hours. Coagulation Profile: No results for input(s): INR, PROTIME in the last 168 hours. Cardiac Enzymes: No results for input(s): CKTOTAL, CKMB, CKMBINDEX, TROPONINI in the last 168 hours. BNP (last 3 results) No results for input(s): PROBNP in the last 8760 hours. HbA1C: No results for input(s): HGBA1C in the last 72 hours. CBG: No results for input(s): GLUCAP in the last 168 hours. Lipid Profile: No results for input(s): CHOL, HDL, LDLCALC, TRIG, CHOLHDL, LDLDIRECT in the last 72 hours. Thyroid Function Tests: No results for input(s):  TSH, T4TOTAL, FREET4, T3FREE, THYROIDAB in the last 72 hours. Anemia Panel: No results for input(s): VITAMINB12, FOLATE, FERRITIN, TIBC, IRON, RETICCTPCT in the last 72 hours. Sepsis Labs: Invalid input(s): PROCALCITONIN, LACTICIDVEN No results found for this or any previous visit (from the past 240 hour(s)).       Radiological Exams on Admission: Personally reviewed CT abdomen and [place] reports shows no acute findings on CT, US shows no cholecystitis or CBD dilation, only gallstones: Ct Abdomen Pelvis W Contrast  Result Date: 01/25/2017 CLINICAL DATA:  83 y/o M; 1 day of abdominal pain. History of bladder cancer status post TURBT in 2010. EXAM: CT ABDOMEN AND PELVIS WITH CONTRAST TECHNIQUE: Multidetector CT imaging of the abdomen and pelvis was performed using the standard protocol following bolus administration of intravenous contrast. CONTRAST:  15mL ISOVUE-300 IOPAMIDOL (ISOVUE-300) INJECTION 61% COMPARISON:  11/30/2016 CT of the abdomen and pelvis. FINDINGS: Lower chest: Mild coronary artery calcification. Stable 3 mm nodule  within periphery of right lower lobe (series 201, image 3). Hepatobiliary: Stable subcentimeter lucencies in segment 2 of the liver (series 201, image 16 and 18) probably representing cysts. No new focal liver lesion. Normal gallbladder. No intra or extrahepatic biliary ductal dilatation. Pancreas: Unremarkable. No pancreatic ductal dilatation or surrounding inflammatory changes. Spleen: Normal in size without focal abnormality. Adrenals/Urinary Tract: Fluid attenuating homogeneous well-circumscribed foci within the kidneys bilaterally the largest in the right kidney upper pole measuring 4.5 cm are compatible with cysts. Subcentimeter lucencies within the kidneys are too small to characterize, but also probably represent cysts. No hydronephrosis. No urinary stone disease. No hydronephrosis. Mild stable bladder wall thickening without nodularity, possibly due to underdistention. Stomach/Bowel: Stomach is within normal limits. Appendix not identified. No evidence of bowel wall thickening, distention, or inflammatory changes. Vascular/Lymphatic: Aortic atherosclerosis. No enlarged abdominal or pelvic lymph nodes. Reproductive: Severe prostate enlargement is stable. Other: No abdominal wall hernia or abnormality. No abdominopelvic ascites. Musculoskeletal: No acute or significant osseous findings. IMPRESSION: 1. No acute process identified as explanation for pain. 2. Stable 3 mm nodule within periphery of right lower lobe. 3. Stable nonspecific subcentimeter liver lucencies in segment 2, likely cysts. 4. Stable renal cysts and additional subcentimeter lucencies too small to characterize, but probably also cysts. 5. Stable severe prostate enlargement. 6. Stable aortic atherosclerosis. Electronically Signed   By: Kristine Garbe M.D.   On: 01/25/2017 19:03   US Abdomen Limited Ruq  Result Date: 01/25/2017 CLINICAL DATA:  83 year old male with right-sided abdominal pain. EXAM: US ABDOMEN LIMITED - RIGHT UPPER  QUADRANT COMPARISON:  CT dated 01/25/2017 FINDINGS: Gallbladder: There is sludge and stones within the gallbladder. No gallbladder wall thickening or pericholecystic fluid. Negative sonographic Murphy's sign. Common bile duct: Diameter: 5 mm Liver: There is diffuse increased liver echogenicity most compatible with fatty infiltration. Superimposed inflammatory or fibrotic changes is not excluded. The main portal vein is patent with appropriate flow direction. There is a 4.4 x 4.3 x 4.1 cm right renal upper pole cyst. IMPRESSION: 1. Cholelithiasis without sonographic evidence of acute cholecystitis. A hepatobiliary scintigraphy may provide better evaluation of the gallbladder if an acute cholecystitis is clinically suspected. 2. Fatty liver. Electronically Signed   By: Anner Crete M.D.   On: 01/25/2017 21:03        Assessment/Plan  1. Abdominal pain with hepatitis:  No evidence of cholecystitis or pancreatitis.  CT imaging normal.  Differential includes biliary colic or passed gallstone.  Patient uncomfortable and elevated LFTs of unknown cause so will observe.   -  Clears only -Repeat LFTs tomorrow -Check hepatitis viral serologies -If trending down, discharge with Gen Surg f/u -If trending up, will keep NPO and discuss with GI re: utility of MRCP or ERCP  2. Hypertension:  Not on treatment as outpatient.  Hypertensive at admission. -Monitor  3. BPH:  -Continue tamsulosin      DVT prophylaxis: Lovenox  Code Status: FULL  Family Communication: Daughter and son at bedside  Disposition Plan: Anticipate clear diet and trend LFTs.  If trending down, discharge with Gen Surg f/u.  If trending up, will discuss MRCP with GI. Consults called: None overnight Admission status: OBs At the point of initial evaluation, it is my clinical opinion that admission for OBSERVATION is reasonable and necessary because the patient's presenting complaints in the context of their chronic conditions  represent sufficient risk of deterioration or significant morbidity to constitute reasonable grounds for close observation in the hospital setting, but that the patient may be medically stable for discharge from the hospital within 24 to 48 hours.    Medical decision making: Patient seen at 10:15 PM on 01/25/2017.  The patient was discussed with Dr. Stark Jock.  What exists of the patient's chart was reviewed in depth and summarized above.  Clinical condition: stable.        Edwin Dada [facility] Pager [phone]

## 2017-01-25 NOTE — ED Provider Notes (Signed)
[facility] DEPT Provider Note   CSN: [id number] Arrival date & time: 01/25/17  1725     History   Chief Complaint Chief Complaint  Patient presents with  . Abdominal Pain    HPI Jeremiah Zimmerman is a 83 y.o. male.  Patient is an 83 year old male with past medical history of appendectomy, bladder cancer, and high cholesterol. He presents for evaluation of lower abdominal pain. He reports experiencing 3 episodes of this over the past 12 hours. It seems to be worse after eating and resolved with drinking something cool. He denies any fevers or chills. He denies any vomiting, diarrhea or constipation. Each episode has lasted approximately 20-30 minutes.   The history is provided by the patient.  Abdominal Pain   This is a new problem. The current episode started 6 to 12 hours ago. Episode frequency: Intermittent. The problem has been resolved. The pain is located in the suprapubic region. The pain is moderate. Pertinent negatives include fever, flatus, hematochezia and constipation. Nothing aggravates the symptoms. The symptoms are relieved by liquids.    Past Medical History:  Diagnosis Date  . Adenomatous colon polyp   . Bladder cancer ([place]) 2010  . BPPV (benign paroxysmal positional vertigo)   . Heart murmur   . Hyperlipidemia   . MVP (mitral valve prolapse) 01-13-13   hx. LBBB previous EKG 11'11  in Epic.  . Seasonal allergies 01-13-13    Patient Active Problem List   Diagnosis Date Noted  . Acute medial meniscal tear 01/19/2013  . MUSCLE PAIN 10/15/2010  . OTHER MALAISE AND FATIGUE 10/15/2010  . DYSPNEA 10/15/2010  . BLADDER CANCER 10/14/2010  . HYPERLIPIDEMIA 10/14/2010  . HYPERTENSION 10/14/2010  . BUNDLE BRANCH BLOCK, LEFT 10/14/2010  . GROSS HEMATURIA 10/14/2010    Past Surgical History:  Procedure Laterality Date  . APPENDECTOMY    . BLADDER SURGERY  2010   bladder cancer in 2010/Post surgery bleed  . CARDIAC CATHETERIZATION  01-13-13   greater than 5 yrs  ago-negative findings  . COLONOSCOPY  01-13-13   hx.colon polyps excised-last 3-4 yrs ago  . ELBOW SURGERY     left  . KNEE ARTHROSCOPY WITH MEDIAL MENISECTOMY Right 01/20/2013   Procedure: KNEE ARTHROSCOPY WITH MEDIAL MENISECTOMY;  Surgeon: Gearlean Alf, MD;  Location: [facility];  Service: Orthopedics;  Laterality: Right;  right knee arthroscopy with medial meniscal debridement and chrondroplasty  . TONSILLECTOMY AND ADENOIDECTOMY         Home Medications    Prior to Admission medications   Medication Sig Start Date End Date Taking? Authorizing Provider  aspirin 81 MG tablet Take 81 mg by mouth daily.      Historical Provider, MD  HYDROcodone-acetaminophen (NORCO) 5-325 MG per tablet Take 1-2 tablets by mouth every 6 (six) hours as needed for pain. 01/20/13   Gaynelle Arabian, MD  methocarbamol (ROBAXIN) 500 MG tablet Take 1 tablet (500 mg total) by mouth 4 (four) times daily. As needed for muscle spasm 01/20/13   Gaynelle Arabian, MD  Multiple Vitamins-Minerals (PA MENS 50 PLUS VITAPAK PO) Take by mouth daily.      Historical Provider, MD  naproxen sodium (ANAPROX) 220 MG tablet Take 220 mg by mouth 2 (two) times daily as needed (for pain).    Historical Provider, MD  Tamsulosin HCl (FLOMAX) 0.4 MG CAPS Take 0.4 mg by mouth daily.     Historical Provider, MD    Family History Family History  Problem Relation Age of Onset  .  Breast cancer Mother   . Prostate cancer Father   . Colon cancer Brother   . Cervical cancer Daughter   . Thyroid cancer Daughter   . Uterine cancer Daughter   . Prostate cancer Brother     Social History Social History  Substance Use Topics  . Smoking status: Never Smoker  . Smokeless tobacco: Never Used  . Alcohol use No     Allergies   Sulfa antibiotics; Tetracyclines & related; Erythromycin; Nitroglycerin; Amoxicillin; and Penicillins   Review of Systems Review of Systems  Constitutional: Negative for fever.  Gastrointestinal: Positive for abdominal  pain. Negative for constipation, flatus and hematochezia.  All other systems reviewed and are negative.    Physical Exam Updated Vital Signs BP 161/66 (BP Location: Right Arm)   Pulse 83   Temp 97.6 F (36.4 C) (Oral)   Resp 22   Ht 5\' 10"  (1.778 m)   Wt 170 lb (77.1 kg)   SpO2 98%   BMI 24.39 kg/m   Physical Exam  Constitutional: He is oriented to person, place, and time. He appears well-developed and well-nourished. No distress.  HENT:  Head: Normocephalic and atraumatic.  Mouth/Throat: Oropharynx is clear and moist.  Neck: Normal range of motion. Neck supple.  Cardiovascular: Normal rate and regular rhythm.  Exam reveals no friction rub.   No murmur heard. Pulmonary/Chest: Effort normal and breath sounds normal. No respiratory distress. He has no wheezes. He has no rales.  Abdominal: Soft. Bowel sounds are normal. He exhibits no distension. There is no tenderness.  Musculoskeletal: Normal range of motion. He exhibits no edema.  Neurological: He is alert and oriented to person, place, and time. Coordination normal.  Skin: Skin is warm and dry. He is not diaphoretic.  Nursing note and vitals reviewed.    ED Treatments / Results  Labs (all labs ordered are listed, but only abnormal results are displayed) Labs Reviewed  LIPASE, BLOOD  COMPREHENSIVE METABOLIC PANEL  CBC  URINALYSIS, ROUTINE W REFLEX MICROSCOPIC    EKG  EKG Interpretation None       Radiology No results found.  Procedures Procedures (including critical care time)  Medications Ordered in ED Medications - No data to display   Initial Impression / Assessment and Plan / ED Course  I have reviewed the triage vital signs and the nursing notes.  Pertinent labs & imaging results that were available during my care of the patient were reviewed by me and considered in my medical decision making (see chart for details).  Patient presents here with episodic right-sided abdominal pain that has been  occurring all afternoon. He has a mild bump in his LFTs, however no white count or fever. CT scan of the abdomen and pelvis reveals no obvious abnormality, however right upper quadrant ultrasound to show cholelithiasis without cholecystitis.  Family is very uncomfortable with this patient being discharged without more of a workup. I've discussed this with Dr. Loleta Books from the hospitalist service who is willing to admit and observe overnight. The patient will then undergo a HIDA scan in the morning.  Final Clinical Impressions(s) / ED Diagnoses   Final diagnoses:  None    New Prescriptions New Prescriptions   No medications on file     Veryl Speak, MD 01/25/17 2138

## 2017-01-25 NOTE — ED Triage Notes (Signed)
Patient from home with [facility] for abdominal pain that started at approximately 1130 this morning.  Patient describes pain as a dull ache across his entire abdomen.  Has history of bladder cancer and kidney stones, states pain was unlike anything he had felt before.  Patient denies pain currently, and is alert, oriented and in no apparent distress at this time.

## 2017-01-25 NOTE — ED Notes (Signed)
Pt informed this RN that approx 22min ago, pt experienced a transient wave of abdominal pain. Pain was sharp, beginning in RLQ and radiating to the L. Pt presently reports pain as 2/10 but states it was 10/10 during episode and had difficulty catching his breath. Mild incr'd pain with palpation of RLQ. Informed MD.

## 2017-01-25 NOTE — ED Notes (Signed)
Patient transported to Ultrasound 

## 2017-01-26 ENCOUNTER — Observation Stay (HOSPITAL_COMMUNITY): Payer: Medicare Other

## 2017-01-26 DIAGNOSIS — R1011 Right upper quadrant pain: Secondary | ICD-10-CM

## 2017-01-26 DIAGNOSIS — I10 Essential (primary) hypertension: Secondary | ICD-10-CM

## 2017-01-26 DIAGNOSIS — K8070 Calculus of gallbladder and bile duct without cholecystitis without obstruction: Secondary | ICD-10-CM

## 2017-01-26 DIAGNOSIS — R7989 Other specified abnormal findings of blood chemistry: Secondary | ICD-10-CM

## 2017-01-26 LAB — HEPATIC FUNCTION PANEL
ALBUMIN: 3.4 g/dL — AB (ref 3.5–5.0)
ALT: 464 U/L — ABNORMAL HIGH (ref 17–63)
AST: 439 U/L — AB (ref 15–41)
Alkaline Phosphatase: 121 U/L (ref 38–126)
Bilirubin, Direct: 1.7 mg/dL — ABNORMAL HIGH (ref 0.1–0.5)
Indirect Bilirubin: 1.4 mg/dL — ABNORMAL HIGH (ref 0.3–0.9)
TOTAL PROTEIN: 6.5 g/dL (ref 6.5–8.1)
Total Bilirubin: 3.1 mg/dL — ABNORMAL HIGH (ref 0.3–1.2)

## 2017-01-26 MED ORDER — TECHNETIUM TC 99M MEBROFENIN IV KIT
5.0000 | PACK | Freq: Once | INTRAVENOUS | Status: AC | PRN
  Administered 2017-01-26: 5 via INTRAVENOUS

## 2017-01-26 NOTE — Consult Note (Signed)
[facility] Surgery Consult Note  Jeremiah Zimmerman 05/27/1934  [id number].    Requesting MD: Dr. Lonny Prude Chief Complaint/Reason for Consult: RLQ pain  HPI:   Jeremiah Zimmerman is an 83 year old male with a PMHx of HTN, BPH, and bladder cancer s/p TURBT in 2010. Patient has a history of an appendectomy but no other abdominal surgeries.  He presented to the ED yesterday following 3 episodes of sharp/stabbing pain that began in the RLQ and radiated to the LLQ. He states that he has never experienced anything similar to this before. He notes that the 3 episodes lasted for about 20 minutes and slowly subsided to a dull/aching pain that remained constant. He denies any aggravating events to trigger the pain. The patient reports that reclining helped relieve the pain. He denies taking any other medications to alleviate symptoms. The patient also noted nausea and lightheadedness. He denies any recent illness, fever, chills, chest pain, dyspnea, vomiting, diarrhea, constipation (last BM 01/24/17).  The patient presented to the ED after his third episode of pain, and proceeded to have another episode of similar pain. Again, the pain subsided after 20 minutes. An US done shows the presence of gallstones but no evidence of cholecystitis. CT scan was unremarkable for source of pain.   Currently, the patient is resting comfortably in bed. He denies any further episodes of pain or nausea. He states that he currently has a dull pain that is continuously present. Patient's LFTs are currently elevated and is awaiting a HIDA scan.   ROS: All systems reviewed and otherwise negative except for those noted in HPI.  Family History  Problem Relation Age of Onset  . Breast cancer Mother   . Prostate cancer Father   . Colon cancer Brother   . Cervical cancer Daughter   . Thyroid cancer Daughter   . Uterine cancer Daughter   . Prostate cancer Brother     Past Medical History:  Diagnosis Date  . Adenomatous  colon polyp   . Bladder cancer ([place]) 2010  . BPH (benign prostatic hyperplasia)   . Heart murmur   . Hyperlipidemia   . Hypertension   . MVP (mitral valve prolapse) 01-13-13   hx. LBBB previous EKG 11'11  in Epic.  . Seasonal allergies 01-13-13    Past Surgical History:  Procedure Laterality Date  . APPENDECTOMY    . BLADDER SURGERY  2010   bladder cancer in 2010/Post surgery bleed  . CARDIAC CATHETERIZATION  01-13-13   greater than 5 yrs ago-negative findings  . COLONOSCOPY  01-13-13   hx.colon polyps excised-last 3-4 yrs ago  . ELBOW SURGERY     left  . KNEE ARTHROSCOPY WITH MEDIAL MENISECTOMY Right 01/20/2013   Procedure: KNEE ARTHROSCOPY WITH MEDIAL MENISECTOMY;  Surgeon: Gearlean Alf, MD;  Location: [facility];  Service: Orthopedics;  Laterality: Right;  right knee arthroscopy with medial meniscal debridement and chrondroplasty  . TONSILLECTOMY    . TONSILLECTOMY AND ADENOIDECTOMY      Social History:  reports that he has never smoked. He has never used smokeless tobacco. He reports that he does not drink alcohol or use drugs.  Allergies:  Allergies  Allergen Reactions  . Erythromycin Nausea Only    REACTION: Intolerance to this medication with upset stomach  . Nitroglycerin Other (See Comments)    REACTION: Intolerance to this medication.  It drops his BP too low and quickly.  . Sulfa Antibiotics Nausea Only and Rash  . Tetracyclines & Related  Nausea Only  . Amoxicillin Diarrhea and Nausea Only  . Penicillins Swelling and Rash    REACTION: Patient develops a rash and swelling at injection site    Medications Prior to Admission  Medication Sig Dispense Refill  . Multiple Vitamins-Minerals (PA MENS 50 PLUS VITAPAK PO) Take by mouth daily.      . Tamsulosin HCl (FLOMAX) 0.4 MG CAPS Take 0.4 mg by mouth daily.     Marland Kitchen triamcinolone cream (KENALOG) 0.1 % Apply 1 application topically 2 (two) times daily as needed for irritation.      Blood pressure (!) 128/55, pulse 71,  temperature 98.2 F (36.8 C), temperature source Oral, resp. rate 17, height '5\' 10"'  (1.778 m), weight 77.1 kg (170 lb), SpO2 91 %. Physical Exam: General: pleasant, cooperative, thin build male. In NAD. HEENT: PERRL, buccal mucosa pink and moist. Heart: regular, rate, and rhythm.  No obvious murmurs, gallops, or rubs noted. Radial pulses +2 and equal bilaterally. Lungs: CTAB, no wheezes, rhonchi, or rales noted.  Respiratory effort nonlabored Abd: Tender to palpation over the RLQ, RUQ, and LLQ. Murphy sign negative.  MS: No cyanosis, clubbing, or edema. Skin: warm and dry with no masses, lesions, or rashes Psych: A&Ox3 with an appropriate affect.  Results for orders placed or performed during the hospital encounter of 01/25/17 (from the past 48 hour(s))  Lipase, blood     Status: None   Collection Time: 01/25/17  5:30 PM  Result Value Ref Range   Lipase 17 11 - 51 U/L  Comprehensive metabolic panel     Status: Abnormal   Collection Time: 01/25/17  5:30 PM  Result Value Ref Range   Sodium 137 135 - 145 mmol/L   Potassium 3.9 3.5 - 5.1 mmol/L   Chloride 104 101 - 111 mmol/L   CO2 25 22 - 32 mmol/L   Glucose, Bld 131 (H) 65 - 99 mg/dL   BUN 13 6 - 20 mg/dL   Creatinine, Ser 0.95 0.61 - 1.24 mg/dL   Calcium 8.9 8.9 - 10.3 mg/dL   Total Protein 6.9 6.5 - 8.1 g/dL   Albumin 3.8 3.5 - 5.0 g/dL   AST 108 (H) 15 - 41 U/L   ALT 71 (H) 17 - 63 U/L   Alkaline Phosphatase 88 38 - 126 U/L   Total Bilirubin 1.7 (H) 0.3 - 1.2 mg/dL   GFR calc non Af Amer >60 >60 mL/min   GFR calc Af Amer >60 >60 mL/min    Comment: (NOTE) The eGFR has been calculated using the CKD EPI equation. This calculation has not been validated in all clinical situations. eGFR's persistently <60 mL/min signify possible Chronic Kidney Disease.    Anion gap 8 5 - 15  CBC     Status: Abnormal   Collection Time: 01/25/17  5:30 PM  Result Value Ref Range   WBC 8.0 4.0 - 10.5 K/uL   RBC 4.90 4.22 - 5.81 MIL/uL    Hemoglobin 15.0 13.0 - 17.0 g/dL   HCT 44.1 39.0 - 52.0 %   MCV 90.0 78.0 - 100.0 fL   MCH 30.6 26.0 - 34.0 pg   MCHC 34.0 30.0 - 36.0 g/dL   RDW 12.7 11.5 - 15.5 %   Platelets 117 (L) 150 - 400 K/uL    Comment: REPEATED TO VERIFY PLATELET COUNT CONFIRMED BY SMEAR   Urinalysis, Routine w reflex microscopic     Status: Abnormal   Collection Time: 01/25/17  6:53 PM  Result Value Ref  Range   Color, Urine YELLOW YELLOW   APPearance CLEAR CLEAR   Specific Gravity, Urine 1.012 1.005 - 1.030   pH 7.0 5.0 - 8.0   Glucose, UA NEGATIVE NEGATIVE mg/dL   Hgb urine dipstick NEGATIVE NEGATIVE   Bilirubin Urine NEGATIVE NEGATIVE   Ketones, ur NEGATIVE NEGATIVE mg/dL   Protein, ur NEGATIVE NEGATIVE mg/dL   Nitrite NEGATIVE NEGATIVE   Leukocytes, UA TRACE (A) NEGATIVE   RBC / HPF 0-5 0 - 5 RBC/hpf   WBC, UA 0-5 0 - 5 WBC/hpf   Bacteria, UA NONE SEEN NONE SEEN   Squamous Epithelial / LPF NONE SEEN NONE SEEN  Hepatic function panel     Status: Abnormal   Collection Time: 01/26/17  5:28 AM  Result Value Ref Range   Total Protein 6.5 6.5 - 8.1 g/dL   Albumin 3.4 (L) 3.5 - 5.0 g/dL   AST 439 (H) 15 - 41 U/L   ALT 464 (H) 17 - 63 U/L   Alkaline Phosphatase 121 38 - 126 U/L   Total Bilirubin 3.1 (H) 0.3 - 1.2 mg/dL   Bilirubin, Direct 1.7 (H) 0.1 - 0.5 mg/dL   Indirect Bilirubin 1.4 (H) 0.3 - 0.9 mg/dL   Ct Abdomen Pelvis W Contrast  Result Date: 01/25/2017 CLINICAL DATA:  83 y/o M; 1 day of abdominal pain. History of bladder cancer status post TURBT in 2010. EXAM: CT ABDOMEN AND PELVIS WITH CONTRAST TECHNIQUE: Multidetector CT imaging of the abdomen and pelvis was performed using the standard protocol following bolus administration of intravenous contrast. CONTRAST:  1108m ISOVUE-300 IOPAMIDOL (ISOVUE-300) INJECTION 61% COMPARISON:  11/30/2016 CT of the abdomen and pelvis. FINDINGS: Lower chest: Mild coronary artery calcification. Stable 3 mm nodule within periphery of right lower lobe (series  201, image 3). Hepatobiliary: Stable subcentimeter lucencies in segment 2 of the liver (series 201, image 16 and 18) probably representing cysts. No new focal liver lesion. Normal gallbladder. No intra or extrahepatic biliary ductal dilatation. Pancreas: Unremarkable. No pancreatic ductal dilatation or surrounding inflammatory changes. Spleen: Normal in size without focal abnormality. Adrenals/Urinary Tract: Fluid attenuating homogeneous well-circumscribed foci within the kidneys bilaterally the largest in the right kidney upper pole measuring 4.5 cm are compatible with cysts. Subcentimeter lucencies within the kidneys are too small to characterize, but also probably represent cysts. No hydronephrosis. No urinary stone disease. No hydronephrosis. Mild stable bladder wall thickening without nodularity, possibly due to underdistention. Stomach/Bowel: Stomach is within normal limits. Appendix not identified. No evidence of bowel wall thickening, distention, or inflammatory changes. Vascular/Lymphatic: Aortic atherosclerosis. No enlarged abdominal or pelvic lymph nodes. Reproductive: Severe prostate enlargement is stable. Other: No abdominal wall hernia or abnormality. No abdominopelvic ascites. Musculoskeletal: No acute or significant osseous findings. IMPRESSION: 1. No acute process identified as explanation for pain. 2. Stable 3 mm nodule within periphery of right lower lobe. 3. Stable nonspecific subcentimeter liver lucencies in segment 2, likely cysts. 4. Stable renal cysts and additional subcentimeter lucencies too small to characterize, but probably also cysts. 5. Stable severe prostate enlargement. 6. Stable aortic atherosclerosis. Electronically Signed   By: LKristine GarbeM.D.   On: 01/25/2017 19:03   [place]dom[place] Limited Ruq  Result Date: 01/25/2017 CLINICAL DATA:  83year old male with right-sided abdominal pain. EXAM: [place] LIMITED - RIGHT UPPER QUADRANT COMPARISON:  CT dated 01/25/2017  FINDINGS: Gallbladder: There is sludge and stones within the gallbladder. No gallbladder wall thickening or pericholecystic fluid. Negative sonographic Murphy's sign. Common bile duct: Diameter: 5 mm  Liver: There is diffuse increased liver echogenicity most compatible with fatty infiltration. Superimposed inflammatory or fibrotic changes is not excluded. The main portal vein is patent with appropriate flow direction. There is a 4.4 x 4.3 x 4.1 cm right renal upper pole cyst. IMPRESSION: 1. Cholelithiasis without sonographic evidence of acute cholecystitis. A hepatobiliary scintigraphy may provide better evaluation of the gallbladder if an acute cholecystitis is clinically suspected. 2. Fatty liver. Electronically Signed   By: Anner Crete M.D.   On: 01/25/2017 21:03      Assessment/Plan  1.  RLQ pain: - Patient awaiting HIDA scan. Will suggest to continue pain management in the interim. - US reveals cholelithiasis, but no evidence of cholecystits; Murphy sign negative - Continue clear liquid diet; - Patient may ambulate as tolerated.   Roselle Locus, PA-S   HIDA scan ordered earlier.  He has abd pain that is bilateral lq and ruq. Has stones on his [place]. Tb rising in association with this pain.  I think he likely has choledocholithiasis and not cholecystitis.  I think he likely should just get mrcp tonight to see if he will need an ercp prior to having a lap chole.

## 2017-01-26 NOTE — Care Management Note (Signed)
Case Management Note  Patient Details  Name: Jeremiah Zimmerman MRN: [id number] Date of Birth: [birth year]  Subjective/Objective:           Patient presented with right-sided abdominal pain. CM will follow for discharge needs pending patient's progress and physician orders.          Action/Plan:   Expected Discharge Date:                  Expected Discharge Plan:     In-House Referral:     Discharge planning Services     Post Acute Care Choice:    Choice offered to:     DME Arranged:    DME Agency:     HH Arranged:    HH Agency:     Status of Service:     If discussed at [employer] of Stay Meetings, dates discussed:    Additional Comments:  Rolm Baptise, RN 01/26/2017, 10:17 AM

## 2017-01-26 NOTE — Consult Note (Signed)
[facility] Consult: 11:26 AM 01/26/2017  LOS: 0 days    Referring Provider: Dr Lonny Prude  Primary Care Physician:  Precious Reel, MD Primary Gastroenterologist:  Dr. Scarlette Shorts  Reason for Consultation:  Abdominal pain and elevated LFTs.     HPI: Jeremiah Zimmerman is a 83 y.o. male.  PMH HTN.  BPH.  Right ureteral urothelial papillary cancer, TURBT 2010.  Thrombocytopenia, dates to 2010.  Echo 4098: grade 2 diatolic dysfunction, EF 11%.  Mild MVR and aortic insufficiency.   04/2005 Colonoscopy:  For sibling hx of colon cancer.  Two, 67m, polyps removed from ascending (reactive lymphoid aggregate) and descending (adenomatous) colon.  Internal rrhoids. 08/2011  Colonoscopy: large, sessile, cecal polyp (tubular adenoma) removed piece meal.  11/2011 Colonoscopy:  Diminutive polyps (prolapse type) x 2 removed.   Yesterday at home between 11:30 and 3:30 PM had 3 episodes of pain in right mid/upper abdomen radiating to left abdomen. Episodes lasted ~ 15 minutes each and made it hard to breath. Pain would subside to a low level. Nausea and dizziness when pain very intense but no emesis, fever, sweats, chills.  Last episode in ED ~7:30 PM.  No pain meds so far as it took so long for RN to attend to pain in ED, that pain had subsided and no longer needed anelgesic.  This AM nausea treated with Zofran.  C/o headache now.  No fever.  Rising LFTs.   T bili 1.7 >> 3.1.  Alk Phos 88 ..Marland Kitchen21.  AST/ALT 108/71 >> 439/464. Lipase normal.  Normal WBCs,  Hgb 15.  Platelets 117.  No coags.  01/25/17 CT ab/pelvis with contrast: 356mright LL lung nodule.  Stable subcentimeter liver lucencies.  Stable renal cyst and subcentimeter lucencies,  Stable prostamegaly.  stabl aortic atherosclerosis.  3/12 Abd ultrasound:  Uncomplicated cholelithiasis and  sludge.  Fatty liver. 5 mm CBD.   He is awaiting HIDA this afternoon.    No GI sxs, troubles generally.  These sxs are totally out of ordinary.  No pruritus, no ETOH, no dark urine, no Acetaminophen.     Past Medical History:  Diagnosis Date  . Adenomatous colon polyp   . Bladder cancer ([facility]2010  . BPH (benign prostatic hyperplasia)   . Heart murmur   . Hyperlipidemia   . Hypertension   . MVP (mitral valve prolapse) 01-13-13   hx. LBBB previous EKG 11'11  in Epic.  . Seasonal allergies 01-13-13    Past Surgical History:  Procedure Laterality Date  . APPENDECTOMY    . BLADDER SURGERY  2010   bladder cancer in 2010/Post surgery bleed  . CARDIAC CATHETERIZATION  01-13-13   greater than 5 yrs ago-negative findings  . COLONOSCOPY  01-13-13   hx.colon polyps excised-last 3-4 yrs ago  . ELBOW SURGERY     left  . KNEE ARTHROSCOPY WITH MEDIAL MENISECTOMY Right 01/20/2013   Procedure: KNEE ARTHROSCOPY WITH MEDIAL MENISECTOMY;  Surgeon: FrGearlean AlfMD;  Location: [facility];  Service: Orthopedics;  Laterality: Right;  right knee arthroscopy with medial  meniscal debridement and chrondroplasty  . TONSILLECTOMY    . TONSILLECTOMY AND ADENOIDECTOMY      Prior to Admission medications   Medication Sig Start Date End Date Taking? Authorizing Provider  Multiple Vitamins-Minerals (PA MENS 50 PLUS VITAPAK PO) Take by mouth daily.     Yes Historical Provider, MD  Tamsulosin HCl (FLOMAX) 0.4 MG CAPS Take 0.4 mg by mouth daily.    Yes Historical Provider, MD  triamcinolone cream (KENALOG) 0.1 % Apply 1 application topically 2 (two) times daily as needed for irritation. 01/16/17  Yes Historical Provider, MD    Scheduled Meds: . enoxaparin (LOVENOX) injection  40 mg Subcutaneous QHS  . tamsulosin  0.4 mg Oral Daily   Infusions:  PRN Meds: acetaminophen **OR** acetaminophen, ibuprofen, ondansetron **OR** ondansetron (ZOFRAN) IV   Allergies as of 01/25/2017 - Review Complete 01/25/2017    Allergen Reaction Noted  . Erythromycin Nausea Only 10/28/2010  . Nitroglycerin Other (See Comments) 10/28/2010  . Sulfa antibiotics Nausea Only and Rash 01/25/2017  . Tetracyclines & related Nausea Only 09/01/2011  . Amoxicillin Diarrhea and Nausea Only 01/20/2013  . Penicillins Swelling and Rash 10/28/2010    Family History  Problem Relation Age of Onset  . Breast cancer Mother   . Prostate cancer Father   . Colon cancer Brother   . Cervical cancer Daughter   . Thyroid cancer Daughter   . Uterine cancer Daughter   . Prostate cancer Brother     Social History   Social History  . Marital status: Married    Spouse name: N/A  . Number of children: N/A  . Years of education: N/A   Occupational History  . Not on file.   Social History Main Topics  . Smoking status: Never Smoker  . Smokeless tobacco: Never Used  . Alcohol use No  . Drug use: No  . Sexual activity: Not Currently   Other Topics Concern  . Not on file   Social History Narrative  . No narrative on file    REVIEW OF SYSTEMS: Constitutional:  Generally fit, does housework, drives, mows with riding mower.  Stable weight.  ENT:  No nose bleeds Pulm:  No SOB or cough CV:  No palpitations, no LE edema. No chest pain GU:  No hematuria, no frequency GI:  Per HPI.  BMs brown qod.   Heme:  No unusual bleeding or bruising   Transfusions:  None ever Neuro:  Has HA this AM.  No peripheral tingling or numbness Derm:  No itching, no rash or sores.  Endocrine:  No sweats or chills.  No polyuria or dysuria Immunization:  No flu shot this year.  Travel:  None beyond local counties in last few months.    PHYSICAL EXAM: Vital signs in last 24 hours: Vitals:   01/26/17 0616 01/26/17 1031  BP: (!) 141/64 (!) 128/55  Pulse: 70 71  Resp: 18 17  Temp: 97.7 F (36.5 C) 98.2 F (36.8 C)   Wt Readings from Last 3 Encounters:  01/25/17 77.1 kg (170 lb)  01/13/13 76.4 kg (168 lb 8 oz)  12/15/11 76.7 kg (169 lb)     General: pleasant, looks well.  Older but not aged appearing.  comfortable Head:  No asymmetry or swelling  Eyes:  No icterus or conj pallor Ears:  Not HOH  Nose:  No discharge Mouth:  Clear, moist oral MM Neck:  No JVD, no mass, no TMG Lungs:  Clear bil.  No dyspnea or cough Heart:  RRR.  No mrg.  S1, s2 present Abdomen:  Soft, active BS.  ND.  Tenderness in right abdomen.  .   Rectal: deferred   Musc/Skeltl: no joint swelling, redness or deformity Extremities:  No CCE  Neurologic:  Oriented x 3.  Fully alert.  Moves all 4 limbs, no tremor or gross weakness Skin:  No jaundice Tattoos:  none Nodes:  No cervical adenopathy   Psych:  Pleasant, cooperative, calm.   Intake/Output from previous day: 03/12 0701 - 03/13 0700 In: 500 [IV Piggyback:500] Out: 1000 [Urine:1000] Intake/Output this shift: No intake/output data recorded.  LAB RESULTS:  Recent Labs  01/25/17 1730  WBC 8.0  HGB 15.0  HCT 44.1  PLT 117*   BMET Lab Results  Component Value Date   NA 137 01/25/2017   NA 136 10/15/2010   NA 136 05/26/2010   K 3.9 01/25/2017   K 4.4 10/15/2010   K 4.0 05/26/2010   CL 104 01/25/2017   CL 101 10/15/2010   CL 105 05/26/2010   CO2 25 01/25/2017   CO2 28 10/15/2010   CO2 24 05/26/2010   GLUCOSE 131 (H) 01/25/2017   GLUCOSE 118 (H) 10/15/2010   GLUCOSE 126 (H) 05/26/2010   BUN 13 01/25/2017   BUN 16 10/15/2010   BUN 14 05/26/2010   CREATININE 0.95 01/25/2017   CREATININE 1.0 10/15/2010   CREATININE 0.93 05/26/2010   CALCIUM 8.9 01/25/2017   CALCIUM 8.9 10/15/2010   CALCIUM 9.0 05/26/2010   LFT  Recent Labs  01/25/17 1730 01/26/17 0528  PROT 6.9 6.5  ALBUMIN 3.8 3.4*  AST 108* 439*  ALT 71* 464*  ALKPHOS 88 121  BILITOT 1.7* 3.1*  BILIDIR  --  1.7*  IBILI  --  1.4*   PT/INR Lab Results  Component Value Date   INR 1.05 10/03/2009   Hepatitis Panel No results for input(s): HEPBSAG, HCVAB, HEPAIGM, HEPBIGM in the last 72 hours. C-Diff No  components found for: CDIFF Lipase     Component Value Date/Time   LIPASE 17 01/25/2017 1730    Drugs of Abuse  No results found for: LABOPIA, COCAINSCRNUR, LABBENZ, AMPHETMU, THCU, LABBARB   RADIOLOGY STUDIES: Ct Abdomen Pelvis W Contrast  Result Date: 01/25/2017 CLINICAL DATA:  83 y/o M; 1 day of abdominal pain. History of bladder cancer status post TURBT in 2010. EXAM: CT ABDOMEN AND PELVIS WITH CONTRAST TECHNIQUE: Multidetector CT imaging of the abdomen and pelvis was performed using the standard protocol following bolus administration of intravenous contrast. CONTRAST:  130m ISOVUE-300 IOPAMIDOL (ISOVUE-300) INJECTION 61% COMPARISON:  11/30/2016 CT of the abdomen and pelvis. FINDINGS: Lower chest: Mild coronary artery calcification. Stable 3 mm nodule within periphery of right lower lobe (series 201, image 3). Hepatobiliary: Stable subcentimeter lucencies in segment 2 of the liver (series 201, image 16 and 18) probably representing cysts. No new focal liver lesion. Normal gallbladder. No intra or extrahepatic biliary ductal dilatation. Pancreas: Unremarkable. No pancreatic ductal dilatation or surrounding inflammatory changes. Spleen: Normal in size without focal abnormality. Adrenals/Urinary Tract: Fluid attenuating homogeneous well-circumscribed foci within the kidneys bilaterally the largest in the right kidney upper pole measuring 4.5 cm are compatible with cysts. Subcentimeter lucencies within the kidneys are too small to characterize, but also probably represent cysts. No hydronephrosis. No urinary stone disease. No hydronephrosis. Mild stable bladder wall thickening without nodularity, possibly due to underdistention. Stomach/Bowel: Stomach is within normal limits. Appendix not identified. No evidence of bowel wall thickening, distention, or inflammatory changes. Vascular/Lymphatic:  Aortic atherosclerosis. No enlarged abdominal or pelvic lymph nodes. Reproductive: Severe prostate  enlargement is stable. Other: No abdominal wall hernia or abnormality. No abdominopelvic ascites. Musculoskeletal: No acute or significant osseous findings. IMPRESSION: 1. No acute process identified as explanation for pain. 2. Stable 3 mm nodule within periphery of right lower lobe. 3. Stable nonspecific subcentimeter liver lucencies in segment 2, likely cysts. 4. Stable renal cysts and additional subcentimeter lucencies too small to characterize, but probably also cysts. 5. Stable severe prostate enlargement. 6. Stable aortic atherosclerosis. Electronically Signed   By: Kristine Garbe M.D.   On: 01/25/2017 19:03   US Abdomen Limited Ruq  Result Date: 01/25/2017 CLINICAL DATA:  83 year old male with right-sided abdominal pain. EXAM: US ABDOMEN LIMITED - RIGHT UPPER QUADRANT COMPARISON:  CT dated 01/25/2017 FINDINGS: Gallbladder: There is sludge and stones within the gallbladder. No gallbladder wall thickening or pericholecystic fluid. Negative sonographic Murphy's sign. Common bile duct: Diameter: 5 mm Liver: There is diffuse increased liver echogenicity most compatible with fatty infiltration. Superimposed inflammatory or fibrotic changes is not excluded. The main portal vein is patent with appropriate flow direction. There is a 4.4 x 4.3 x 4.1 cm right renal upper pole cyst. IMPRESSION: 1. Cholelithiasis without sonographic evidence of acute cholecystitis. A hepatobiliary scintigraphy may provide better evaluation of the gallbladder if an acute cholecystitis is clinically suspected. 2. Fatty liver. Electronically Signed   By: Anner Crete M.D.   On: 01/25/2017 21:03     IMPRESSION:   *  Colicky abd pain sounds biliary.  GB sludge and stones, normal diameter CBD, fatty liver on [place].  Rising LFTs.  Symptomatic cholelithiasis without cholecystitis, ? Passed CBD stone.      PLAN:     *  Spoke with Dr Carlean Purl and surgical PA.  Surgery suggests proceeding with HIDA scan.  Pt is NPO for  this.    Azucena Freed  01/26/2017, 11:26 AM Pager: [phone]  Agree with Ms. Sandi Carne assessment and plan.  Sounds like biliary colic to [place] - await HIDA If ? Of CBD obstruction would probably do an MRCP next -   Gatha Mayer, MD, [facility]

## 2017-01-26 NOTE — Progress Notes (Signed)
PROGRESS NOTE    Jeremiah Zimmerman  EXH:[id number] DOB: 1934/09/24 DOA: 01/25/2017 PCP: Precious Reel, MD   Brief Narrative: Jeremiah Zimmerman is a 83 y.o. male with a history of BPH, LBBB, hypertension, hyperlipidemia and bladder cancer. He presented with intermittent RUQ abdominal pain.   Assessment & Plan:   Principal Problem:   Right upper quadrant abdominal pain Active Problems:   Essential hypertension   Elevated LFTs   RUQ abdominal pain Nausea/vomiting Sounds like biliary colic, however, patient with elevating transaminases and alkaline phosphatase, in addition to elevated bilirubin. Cholelithiasis seen on ultrasound -HIDA scan -[facility] consult -continue Zofran  Essential hypertension Slightly better controlled this morning. Diet controlled as an outpatient -continue to monitor blood pressure. Might need to recommend antihypertensive either inpatient or as an outpatient  BPH Chronic. Stable. -continue tamsulosin  Chronic diastolic heart failure Last EF of 55% with grade 2 diastolic dysfunction seen on echo from 10/28/2010   DVT prophylaxis: Lovenox Code Status: Full code Family Communication: Daughter at bedside Disposition Plan: Discharge pending workup for possible gallbladder pathology   Consultants:   [facility]  Procedures:   None  Antimicrobials:  None    Subjective: Persistent pain overnight that is dull and achy in RUQ of abdomen. No fevers. He has had some nausea and needed IV zofran overnight to help with symptoms.  Objective: Vitals:   01/25/17 2315 01/26/17 0209 01/26/17 0616 01/26/17 1031  BP: (!) 152/67 (!) 152/58 (!) 141/64 (!) 128/55  Pulse: 75 69 70 71  Resp: 16 18 18 17   Temp: 97.6 F (36.4 C) 97.3 F (36.3 C) 97.7 F (36.5 C) 98.2 F (36.8 C)  TempSrc: Oral Oral Oral Oral  SpO2: 98% 95% 94% 91%  Weight: 77.1 kg (170 lb)     Height: 5\' 10"  (1.778 m)       Intake/Output Summary (Last 24 hours) at 01/26/17 1111 Last  data filed at 01/26/17 0400  Gross per 24 hour  Intake              500 ml  Output             1000 ml  Net             -500 ml   Filed Weights   01/25/17 1728 01/25/17 2315  Weight: 77.1 kg (170 lb) 77.1 kg (170 lb)    Examination:  General exam: Appears calm and comfortable HEENT: no scleral icterus Respiratory system: Clear to auscultation. Respiratory effort normal. Cardiovascular system: S1 & S2 heard, RRR. 1/6 systolic murmur. Gastrointestinal system: Abdomen is nondistended, soft and nontender. Normal bowel sounds heard. Negative murphy sign Central nervous system: Alert and oriented. No focal neurological deficits. Extremities: No edema. No calf tenderness Skin: No cyanosis. No rashes Psychiatry: Judgement and insight appear normal. Mood & affect appropriate.     Data Reviewed: I have personally reviewed following labs and imaging studies  CBC:  Recent Labs Lab 01/25/17 1730  WBC 8.0  HGB 15.0  HCT 44.1  MCV 90.0  PLT 381*   Basic Metabolic Panel:  Recent Labs Lab 01/25/17 1730  NA 137  K 3.9  CL 104  CO2 25  GLUCOSE 131*  BUN 13  CREATININE 0.95  CALCIUM 8.9   GFR: Estimated Creatinine Clearance: 63 mL/min (by C-G formula based on SCr of 0.95 mg/dL). Liver Function Tests:  Recent Labs Lab 01/25/17 1730 01/26/17 0528  AST 108* 439*  ALT 71* 464*  ALKPHOS 88 121  BILITOT 1.7* 3.1*  PROT 6.9 6.5  ALBUMIN 3.8 3.4*    Recent Labs Lab 01/25/17 1730  LIPASE 17   No results for input(s): AMMONIA in the last 168 hours. Coagulation Profile: No results for input(s): INR, PROTIME in the last 168 hours. Cardiac Enzymes: No results for input(s): CKTOTAL, CKMB, CKMBINDEX, TROPONINI in the last 168 hours. BNP (last 3 results) No results for input(s): PROBNP in the last 8760 hours. HbA1C: No results for input(s): HGBA1C in the last 72 hours. CBG: No results for input(s): GLUCAP in the last 168 hours. Lipid Profile: No results for input(s):  CHOL, HDL, LDLCALC, TRIG, CHOLHDL, LDLDIRECT in the last 72 hours. Thyroid Function Tests: No results for input(s): TSH, T4TOTAL, FREET4, T3FREE, THYROIDAB in the last 72 hours. Anemia Panel: No results for input(s): VITAMINB12, FOLATE, FERRITIN, TIBC, IRON, RETICCTPCT in the last 72 hours. Sepsis Labs: No results for input(s): PROCALCITON, LATICACIDVEN in the last 168 hours.  No results found for this or any previous visit (from the past 240 hour(s)).       Radiology Studies: Ct Abdomen Pelvis W Contrast  Result Date: 01/25/2017 CLINICAL DATA:  83 y/o M; 1 day of abdominal pain. History of bladder cancer status post TURBT in 2010. EXAM: CT ABDOMEN AND PELVIS WITH CONTRAST TECHNIQUE: Multidetector CT imaging of the abdomen and pelvis was performed using the standard protocol following bolus administration of intravenous contrast. CONTRAST:  114mL ISOVUE-300 IOPAMIDOL (ISOVUE-300) INJECTION 61% COMPARISON:  11/30/2016 CT of the abdomen and pelvis. FINDINGS: Lower chest: Mild coronary artery calcification. Stable 3 mm nodule within periphery of right lower lobe (series 201, image 3). Hepatobiliary: Stable subcentimeter lucencies in segment 2 of the liver (series 201, image 16 and 18) probably representing cysts. No new focal liver lesion. Normal gallbladder. No intra or extrahepatic biliary ductal dilatation. Pancreas: Unremarkable. No pancreatic ductal dilatation or surrounding inflammatory changes. Spleen: Normal in size without focal abnormality. Adrenals/Urinary Tract: Fluid attenuating homogeneous well-circumscribed foci within the kidneys bilaterally the largest in the right kidney upper pole measuring 4.5 cm are compatible with cysts. Subcentimeter lucencies within the kidneys are too small to characterize, but also probably represent cysts. No hydronephrosis. No urinary stone disease. No hydronephrosis. Mild stable bladder wall thickening without nodularity, possibly due to underdistention.  Stomach/Bowel: Stomach is within normal limits. Appendix not identified. No evidence of bowel wall thickening, distention, or inflammatory changes. Vascular/Lymphatic: Aortic atherosclerosis. No enlarged abdominal or pelvic lymph nodes. Reproductive: Severe prostate enlargement is stable. Other: No abdominal wall hernia or abnormality. No abdominopelvic ascites. Musculoskeletal: No acute or significant osseous findings. IMPRESSION: 1. No acute process identified as explanation for pain. 2. Stable 3 mm nodule within periphery of right lower lobe. 3. Stable nonspecific subcentimeter liver lucencies in segment 2, likely cysts. 4. Stable renal cysts and additional subcentimeter lucencies too small to characterize, but probably also cysts. 5. Stable severe prostate enlargement. 6. Stable aortic atherosclerosis. Electronically Signed   By: Kristine Garbe M.D.   On: 01/25/2017 19:03   US Abdomen Limited Ruq  Result Date: 01/25/2017 CLINICAL DATA:  83 year old male with right-sided abdominal pain. EXAM: US ABDOMEN LIMITED - RIGHT UPPER QUADRANT COMPARISON:  CT dated 01/25/2017 FINDINGS: Gallbladder: There is sludge and stones within the gallbladder. No gallbladder wall thickening or pericholecystic fluid. Negative sonographic Murphy's sign. Common bile duct: Diameter: 5 mm Liver: There is diffuse increased liver echogenicity most compatible with fatty infiltration. Superimposed inflammatory or fibrotic changes is not excluded. The main portal vein is patent  with appropriate flow direction. There is a 4.4 x 4.3 x 4.1 cm right renal upper pole cyst. IMPRESSION: 1. Cholelithiasis without sonographic evidence of acute cholecystitis. A hepatobiliary scintigraphy may provide better evaluation of the gallbladder if an acute cholecystitis is clinically suspected. 2. Fatty liver. Electronically Signed   By: Anner Crete M.D.   On: 01/25/2017 21:03        Scheduled Meds: . enoxaparin (LOVENOX) injection  40  mg Subcutaneous QHS  . tamsulosin  0.4 mg Oral Daily   Continuous Infusions:   LOS: 0 days     Cordelia Poche [facility] 01/26/2017, 11:11 AM Pager: [phone]  If 7PM-7AM, please contact night-coverage [email or web address] Password [email or web address] 01/26/2017, 11:11 AM

## 2017-01-27 ENCOUNTER — Observation Stay (HOSPITAL_COMMUNITY): Payer: Medicare Other | Admitting: Certified Registered Nurse Anesthetist

## 2017-01-27 ENCOUNTER — Observation Stay (HOSPITAL_COMMUNITY): Payer: Medicare Other

## 2017-01-27 ENCOUNTER — Encounter (HOSPITAL_COMMUNITY)
Admission: EM | Disposition: A | Discharge status: Home / Self Care | Payer: Self-pay | Source: Home / Self Care | Attending: Emergency Medicine

## 2017-01-27 ENCOUNTER — Encounter (HOSPITAL_COMMUNITY): Payer: Self-pay | Admitting: Surgery

## 2017-01-27 DIAGNOSIS — I110 Hypertensive heart disease with heart failure: Secondary | ICD-10-CM | POA: Diagnosis not present

## 2017-01-27 DIAGNOSIS — K8080 Other cholelithiasis without obstruction: Secondary | ICD-10-CM | POA: Diagnosis not present

## 2017-01-27 DIAGNOSIS — R1011 Right upper quadrant pain: Secondary | ICD-10-CM | POA: Diagnosis not present

## 2017-01-27 DIAGNOSIS — I5032 Chronic diastolic (congestive) heart failure: Secondary | ICD-10-CM | POA: Diagnosis not present

## 2017-01-27 DIAGNOSIS — E785 Hyperlipidemia, unspecified: Secondary | ICD-10-CM | POA: Diagnosis not present

## 2017-01-27 HISTORY — PX: CHOLECYSTECTOMY: SHX55

## 2017-01-27 LAB — HEPATITIS PANEL, ACUTE
HCV Ab: <0.1 s/co ratio (ref 0.0–0.9)
Hep A IgM: NEGATIVE
Hep B C IgM: NEGATIVE
Hepatitis B Surface Ag: NEGATIVE

## 2017-01-27 LAB — CBC
HCT: 41 % (ref 39.0–52.0)
Hemoglobin: 13.3 g/dL (ref 13.0–17.0)
MCH: 29.8 pg (ref 26.0–34.0)
MCHC: 32.4 g/dL (ref 30.0–36.0)
MCV: 91.7 fL (ref 78.0–100.0)
PLATELETS: 73 10*3/uL — AB (ref 150–400)
RBC: 4.47 MIL/uL (ref 4.22–5.81)
RDW: 12.9 % (ref 11.5–15.5)
WBC: 4.4 10*3/uL (ref 4.0–10.5)

## 2017-01-27 LAB — COMPREHENSIVE METABOLIC PANEL
ALBUMIN: 3.3 g/dL — AB (ref 3.5–5.0)
ALK PHOS: 122 U/L (ref 38–126)
ALT: 384 U/L — AB (ref 17–63)
AST: 189 U/L — AB (ref 15–41)
Anion gap: 7 (ref 5–15)
BUN: 8 mg/dL (ref 6–20)
CHLORIDE: 107 mmol/L (ref 101–111)
CO2: 24 mmol/L (ref 22–32)
CREATININE: 0.92 mg/dL (ref 0.61–1.24)
Calcium: 8.5 mg/dL — ABNORMAL LOW (ref 8.9–10.3)
GFR calc Af Amer: >60 mL/min (ref >60)
GFR calc non Af Amer: >60 mL/min (ref >60)
GLUCOSE: 99 mg/dL (ref 65–99)
Potassium: 4.1 mmol/L (ref 3.5–5.1)
SODIUM: 138 mmol/L (ref 135–145)
Total Bilirubin: 1.6 mg/dL — ABNORMAL HIGH (ref 0.3–1.2)
Total Protein: 5.9 g/dL — ABNORMAL LOW (ref 6.5–8.1)

## 2017-01-27 SURGERY — LAPAROSCOPIC CHOLECYSTECTOMY WITH INTRAOPERATIVE CHOLANGIOGRAM
Anesthesia: General | Site: Abdomen

## 2017-01-27 MED ORDER — LIDOCAINE HCL (CARDIAC) 20 MG/ML IV SOLN
INTRAVENOUS | Status: DC | PRN
  Administered 2017-01-27: 100 mg via INTRAVENOUS

## 2017-01-27 MED ORDER — SUCCINYLCHOLINE CHLORIDE 200 MG/10ML IV SOSY
PREFILLED_SYRINGE | INTRAVENOUS | Status: AC
  Filled 2017-01-27: qty 10

## 2017-01-27 MED ORDER — ONDANSETRON HCL 4 MG/2ML IJ SOLN
INTRAMUSCULAR | Status: DC | PRN
  Administered 2017-01-27: 4 mg via INTRAVENOUS

## 2017-01-27 MED ORDER — PHENYLEPHRINE HCL 10 MG/ML IJ SOLN
INTRAVENOUS | Status: DC | PRN
  Administered 2017-01-27: 20 ug/min via INTRAVENOUS

## 2017-01-27 MED ORDER — ONDANSETRON HCL 4 MG/2ML IJ SOLN
INTRAMUSCULAR | Status: AC
  Filled 2017-01-27: qty 2

## 2017-01-27 MED ORDER — ALBUTEROL SULFATE HFA 108 (90 BASE) MCG/ACT IN AERS
INHALATION_SPRAY | RESPIRATORY_TRACT | Status: AC
  Filled 2017-01-27: qty 6.7

## 2017-01-27 MED ORDER — FENTANYL CITRATE (PF) 100 MCG/2ML IJ SOLN
INTRAMUSCULAR | Status: AC
  Filled 2017-01-27: qty 4

## 2017-01-27 MED ORDER — ROCURONIUM BROMIDE 100 MG/10ML IV SOLN
INTRAVENOUS | Status: DC | PRN
  Administered 2017-01-27: 50 mg via INTRAVENOUS

## 2017-01-27 MED ORDER — PHENYLEPHRINE 40 MCG/ML (10ML) SYRINGE FOR IV PUSH (FOR BLOOD PRESSURE SUPPORT)
PREFILLED_SYRINGE | INTRAVENOUS | Status: AC
  Filled 2017-01-27: qty 10

## 2017-01-27 MED ORDER — DIPHENHYDRAMINE HCL 50 MG/ML IJ SOLN
INTRAMUSCULAR | Status: AC
  Filled 2017-01-27: qty 2

## 2017-01-27 MED ORDER — HYDROMORPHONE HCL 1 MG/ML IJ SOLN
0.2500 mg | INTRAMUSCULAR | Status: DC | PRN
  Administered 2017-01-27: 0.25 mg via INTRAVENOUS
  Administered 2017-01-27 (×2): 0.5 mg via INTRAVENOUS
  Administered 2017-01-27: 0.25 mg via INTRAVENOUS

## 2017-01-27 MED ORDER — ZOLPIDEM TARTRATE 5 MG PO TABS
5.0000 mg | ORAL_TABLET | Freq: Once | ORAL | Status: AC
  Administered 2017-01-27: 5 mg via ORAL
  Filled 2017-01-27: qty 1

## 2017-01-27 MED ORDER — SUGAMMADEX SODIUM 500 MG/5ML IV SOLN
INTRAVENOUS | Status: AC
  Filled 2017-01-27: qty 5

## 2017-01-27 MED ORDER — PROPOFOL 10 MG/ML IV BOLUS
INTRAVENOUS | Status: DC | PRN
  Administered 2017-01-27: 120 mg via INTRAVENOUS

## 2017-01-27 MED ORDER — CIPROFLOXACIN IN D5W 400 MG/200ML IV SOLN
400.0000 mg | Freq: Once | INTRAVENOUS | Status: AC
  Administered 2017-01-27: 400 mg via INTRAVENOUS
  Filled 2017-01-27: qty 200

## 2017-01-27 MED ORDER — HYDROMORPHONE HCL 1 MG/ML IJ SOLN
INTRAMUSCULAR | Status: AC
Start: 2017-01-27 — End: 2017-01-27
  Administered 2017-01-27: 0.25 mg via INTRAVENOUS
  Filled 2017-01-27: qty 1

## 2017-01-27 MED ORDER — PROMETHAZINE HCL 25 MG/ML IJ SOLN
12.5000 mg | Freq: Once | INTRAMUSCULAR | Status: AC
  Administered 2017-01-27: 12.5 mg via INTRAVENOUS
  Filled 2017-01-27: qty 1

## 2017-01-27 MED ORDER — HYDROMORPHONE HCL 1 MG/ML IJ SOLN
INTRAMUSCULAR | Status: AC
  Administered 2017-01-27: 0.5 mg via INTRAVENOUS
  Filled 2017-01-27: qty 0.5

## 2017-01-27 MED ORDER — FENTANYL CITRATE (PF) 100 MCG/2ML IJ SOLN
INTRAMUSCULAR | Status: AC
  Filled 2017-01-27: qty 2

## 2017-01-27 MED ORDER — PROMETHAZINE HCL 25 MG/ML IJ SOLN: 6.2500 mg | INTRAMUSCULAR | Status: DC | PRN

## 2017-01-27 MED ORDER — FENTANYL CITRATE (PF) 100 MCG/2ML IJ SOLN
INTRAMUSCULAR | Status: DC | PRN
  Administered 2017-01-27 (×6): 50 ug via INTRAVENOUS

## 2017-01-27 MED ORDER — ROCURONIUM BROMIDE 50 MG/5ML IV SOSY
PREFILLED_SYRINGE | INTRAVENOUS | Status: AC
  Filled 2017-01-27: qty 10

## 2017-01-27 MED ORDER — OXYCODONE-ACETAMINOPHEN 5-325 MG PO TABS
1.0000 | ORAL_TABLET | ORAL | Status: DC | PRN
  Administered 2017-01-27: 2 via ORAL
  Administered 2017-01-28 (×2): 1 via ORAL
  Filled 2017-01-27: qty 1
  Filled 2017-01-27 (×2): qty 2

## 2017-01-27 MED ORDER — 0.9 % SODIUM CHLORIDE (POUR BTL) OPTIME
TOPICAL | Status: DC | PRN
  Administered 2017-01-27: 1000 mL

## 2017-01-27 MED ORDER — ESMOLOL HCL 100 MG/10ML IV SOLN
INTRAVENOUS | Status: DC | PRN
  Administered 2017-01-27: 20 mg via INTRAVENOUS
  Administered 2017-01-27: 10 mg via INTRAVENOUS

## 2017-01-27 MED ORDER — HEMOSTATIC AGENTS (NO CHARGE) OPTIME
TOPICAL | Status: DC | PRN
  Administered 2017-01-27: 1 via TOPICAL

## 2017-01-27 MED ORDER — BUPIVACAINE HCL (PF) 0.25 % IJ SOLN
INTRAMUSCULAR | Status: AC
  Filled 2017-01-27: qty 30

## 2017-01-27 MED ORDER — DEXAMETHASONE SODIUM PHOSPHATE 10 MG/ML IJ SOLN
INTRAMUSCULAR | Status: DC | PRN
  Administered 2017-01-27: 10 mg via INTRAVENOUS

## 2017-01-27 MED ORDER — LACTATED RINGERS IV SOLN
INTRAVENOUS | Status: DC
Start: 2017-01-27 — End: 2017-01-27
  Administered 2017-01-27: 11:00:00 via INTRAVENOUS

## 2017-01-27 MED ORDER — MORPHINE SULFATE (PF) 2 MG/ML IV SOLN
1.0000 mg | INTRAVENOUS | Status: DC | PRN
  Filled 2017-01-27: qty 1

## 2017-01-27 MED ORDER — SUGAMMADEX SODIUM 200 MG/2ML IV SOLN
INTRAVENOUS | Status: DC | PRN
  Administered 2017-01-27: 150 mg via INTRAVENOUS

## 2017-01-27 MED ORDER — DEXAMETHASONE SODIUM PHOSPHATE 10 MG/ML IJ SOLN
INTRAMUSCULAR | Status: AC
  Filled 2017-01-27: qty 2

## 2017-01-27 MED ORDER — BUPIVACAINE HCL 0.25 % IJ SOLN
INTRAMUSCULAR | Status: DC | PRN
  Administered 2017-01-27: 8 mL

## 2017-01-27 MED ORDER — LIDOCAINE 2% (20 MG/ML) 5 ML SYRINGE
INTRAMUSCULAR | Status: AC
  Filled 2017-01-27: qty 10

## 2017-01-27 MED ORDER — SODIUM CHLORIDE 0.9 % IV SOLN
INTRAVENOUS | Status: DC | PRN
  Administered 2017-01-27: 9 mL

## 2017-01-27 MED ORDER — IOPAMIDOL (ISOVUE-300) INJECTION 61%
INTRAVENOUS | Status: AC
  Filled 2017-01-27: qty 50

## 2017-01-27 MED ORDER — PROPOFOL 10 MG/ML IV BOLUS
INTRAVENOUS | Status: AC
  Filled 2017-01-27: qty 20

## 2017-01-27 MED ORDER — SODIUM CHLORIDE 0.9 % IR SOLN
Status: DC | PRN
  Administered 2017-01-27: 1000 mL

## 2017-01-27 SURGICAL SUPPLY — 49 items
APPLIER CLIP 5 13 M/L LIGAMAX5 (MISCELLANEOUS) ×3
BLADE CLIPPER SURG (BLADE) ×3 IMPLANT
CANISTER SUCT 3000ML PPV (MISCELLANEOUS) ×3 IMPLANT
CHLORAPREP W/TINT 26ML (MISCELLANEOUS) ×3 IMPLANT
CLIP APPLIE 5 13 M/L LIGAMAX5 (MISCELLANEOUS) ×1 IMPLANT
CLOSURE WOUND 1/2 X4 (GAUZE/BANDAGES/DRESSINGS) ×1
COVER MAYO STAND STRL (DRAPES) ×3 IMPLANT
COVER SURGICAL LIGHT HANDLE (MISCELLANEOUS) ×3 IMPLANT
DERMABOND ADVANCED (GAUZE/BANDAGES/DRESSINGS) ×2
DERMABOND ADVANCED .7 DNX12 (GAUZE/BANDAGES/DRESSINGS) ×1 IMPLANT
DEVICE TROCAR PUNCTURE CLOSURE (ENDOMECHANICALS) ×3 IMPLANT
DRAPE C-ARM 42X72 X-RAY (DRAPES) ×3 IMPLANT
ELECT REM PT RETURN 9FT ADLT (ELECTROSURGICAL) ×3
ELECTRODE REM PT RTRN 9FT ADLT (ELECTROSURGICAL) ×1 IMPLANT
GLOVE BIO SURGEON STRL SZ7 (GLOVE) ×6 IMPLANT
GLOVE BIOGEL PI IND STRL 6.5 (GLOVE) ×1 IMPLANT
GLOVE BIOGEL PI IND STRL 7.5 (GLOVE) ×1 IMPLANT
GLOVE BIOGEL PI IND STRL 8 (GLOVE) ×1 IMPLANT
GLOVE BIOGEL PI INDICATOR 6.5 (GLOVE) ×2
GLOVE BIOGEL PI INDICATOR 7.5 (GLOVE) ×2
GLOVE BIOGEL PI INDICATOR 8 (GLOVE) ×2
GLOVE ECLIPSE 6.0 STRL STRAW (GLOVE) ×3 IMPLANT
GLOVE ECLIPSE 7.5 STRL STRAW (GLOVE) ×3 IMPLANT
GOWN STRL REUS W/ TWL LRG LVL3 (GOWN DISPOSABLE) ×3 IMPLANT
GOWN STRL REUS W/ TWL XL LVL3 (GOWN DISPOSABLE) ×1 IMPLANT
GOWN STRL REUS W/TWL LRG LVL3 (GOWN DISPOSABLE) ×6
GOWN STRL REUS W/TWL XL LVL3 (GOWN DISPOSABLE) ×2
HEMOSTAT SNOW SURGICEL 2X4 (HEMOSTASIS) ×3 IMPLANT
KIT BASIN OR (CUSTOM PROCEDURE TRAY) ×3 IMPLANT
KIT ROOM TURNOVER OR (KITS) ×3 IMPLANT
NS IRRIG 1000ML POUR BTL (IV SOLUTION) ×3 IMPLANT
PAD ARMBOARD 7.5X6 YLW CONV (MISCELLANEOUS) ×3 IMPLANT
POUCH RETRIEVAL ECOSAC 10 (ENDOMECHANICALS) ×1 IMPLANT
POUCH RETRIEVAL ECOSAC 10MM (ENDOMECHANICALS) ×2
SCISSORS LAP 5X35 DISP (ENDOMECHANICALS) ×3 IMPLANT
SET CHOLANGIOGRAPH 5 50 .035 (SET/KITS/TRAYS/PACK) ×3 IMPLANT
SET IRRIG TUBING LAPAROSCOPIC (IRRIGATION / IRRIGATOR) ×3 IMPLANT
SLEEVE ENDOPATH XCEL 5M (ENDOMECHANICALS) ×6 IMPLANT
SPECIMEN JAR MEDIUM (MISCELLANEOUS) ×3 IMPLANT
SPECIMEN JAR SMALL (MISCELLANEOUS) IMPLANT
STRIP CLOSURE SKIN 1/2X4 (GAUZE/BANDAGES/DRESSINGS) ×2 IMPLANT
SUT MNCRL AB 4-0 PS2 18 (SUTURE) ×6 IMPLANT
SUT VICRYL 0 UR6 27IN ABS (SUTURE) ×3 IMPLANT
TOWEL OR 17X24 6PK STRL BLUE (TOWEL DISPOSABLE) IMPLANT
TOWEL OR 17X26 10 PK STRL BLUE (TOWEL DISPOSABLE) ×3 IMPLANT
TRAY LAPAROSCOPIC MC (CUSTOM PROCEDURE TRAY) ×3 IMPLANT
TROCAR XCEL BLUNT TIP 100MML (ENDOMECHANICALS) ×3 IMPLANT
TROCAR XCEL NON-BLD 5MMX100MML (ENDOMECHANICALS) ×3 IMPLANT
TUBING INSUFFLATION (TUBING) ×3 IMPLANT

## 2017-01-27 NOTE — Progress Notes (Signed)
Consent signed.

## 2017-01-27 NOTE — Anesthesia Procedure Notes (Signed)
Procedure Name: Intubation Performed by: Valda Favia Pre-anesthesia Checklist: Patient identified, Emergency Drugs available, Suction available, Patient being monitored and Timeout performed Patient Re-evaluated:Patient Re-evaluated prior to inductionOxygen Delivery Method: Circle system utilized Preoxygenation: Pre-oxygenation with 100% oxygen Intubation Type: IV induction Ventilation: Mask ventilation without difficulty Laryngoscope Size: Mac and 4 Grade View: Grade I Tube type: Oral Tube size: 7.5 mm Number of attempts: 1 Airway Equipment and Method: Stylet Placement Confirmation: ETT inserted through vocal cords under direct vision,  positive ETCO2 and breath sounds checked- equal and bilateral Secured at: 20 cm Tube secured with: Tape Dental Injury: Teeth and Oropharynx as per pre-operative assessment

## 2017-01-27 NOTE — Progress Notes (Signed)
Subjective: Feels better, hida negative with filling of sb, some ruq discomfort  Objective: Vital signs in last 24 hours: Temp:  [97.7 F (36.5 C)-98.3 F (36.8 C)] 97.9 F (36.6 C) (03/14 0800) Pulse Rate:  [58-84] 58 (03/14 0800) Resp:  [17-19] 18 (03/14 0800) BP: (123-139)/(53-76) 132/53 (03/14 0800) SpO2:  [91 %-95 %] 95 % (03/14 0800) Last BM Date: 01/24/17  Intake/Output from previous day: 03/13 0701 - 03/14 0700 In: -  Out: 650 [Urine:650] Intake/Output this shift: Total I/O In: -  Out: 250 [Urine:250]  GI: soft mild tender ruq  Lab Results:   Recent Labs  01/25/17 1730 01/27/17 0255  WBC 8.0 4.4  HGB 15.0 13.3  HCT 44.1 41.0  PLT 117* 73*   BMET  Recent Labs  01/25/17 1730 01/27/17 0255  NA 137 138  K 3.9 4.1  CL 104 107  CO2 25 24  GLUCOSE 131* 99  BUN 13 8  CREATININE 0.95 0.92  CALCIUM 8.9 8.5*   PT/INR No results for input(s): LABPROT, INR in the last 72 hours. ABG No results for input(s): PHART, HCO3 in the last 72 hours.  Invalid input(s): PCO2, PO2  Studies/Results: Ct Abdomen Pelvis W Contrast  Result Date: 01/25/2017 CLINICAL DATA:  83 y/o M; 1 day of abdominal pain. History of bladder cancer status post TURBT in 2010. EXAM: CT ABDOMEN AND PELVIS WITH CONTRAST TECHNIQUE: Multidetector CT imaging of the abdomen and pelvis was performed using the standard protocol following bolus administration of intravenous contrast. CONTRAST:  181mL ISOVUE-300 IOPAMIDOL (ISOVUE-300) INJECTION 61% COMPARISON:  11/30/2016 CT of the abdomen and pelvis. FINDINGS: Lower chest: Mild coronary artery calcification. Stable 3 mm nodule within periphery of right lower lobe (series 201, image 3). Hepatobiliary: Stable subcentimeter lucencies in segment 2 of the liver (series 201, image 16 and 18) probably representing cysts. No new focal liver lesion. Normal gallbladder. No intra or extrahepatic biliary ductal dilatation. Pancreas: Unremarkable. No pancreatic  ductal dilatation or surrounding inflammatory changes. Spleen: Normal in size without focal abnormality. Adrenals/Urinary Tract: Fluid attenuating homogeneous well-circumscribed foci within the kidneys bilaterally the largest in the right kidney upper pole measuring 4.5 cm are compatible with cysts. Subcentimeter lucencies within the kidneys are too small to characterize, but also probably represent cysts. No hydronephrosis. No urinary stone disease. No hydronephrosis. Mild stable bladder wall thickening without nodularity, possibly due to underdistention. Stomach/Bowel: Stomach is within normal limits. Appendix not identified. No evidence of bowel wall thickening, distention, or inflammatory changes. Vascular/Lymphatic: Aortic atherosclerosis. No enlarged abdominal or pelvic lymph nodes. Reproductive: Severe prostate enlargement is stable. Other: No abdominal wall hernia or abnormality. No abdominopelvic ascites. Musculoskeletal: No acute or significant osseous findings. IMPRESSION: 1. No acute process identified as explanation for pain. 2. Stable 3 mm nodule within periphery of right lower lobe. 3. Stable nonspecific subcentimeter liver lucencies in segment 2, likely cysts. 4. Stable renal cysts and additional subcentimeter lucencies too small to characterize, but probably also cysts. 5. Stable severe prostate enlargement. 6. Stable aortic atherosclerosis. Electronically Signed   By: Kristine Garbe M.D.   On: 01/25/2017 19:03   Nm Hepato W/eject Fract  Result Date: 01/26/2017 CLINICAL DATA:  Right upper quadrant pain EXAM: NUCLEAR MEDICINE HEPATOBILIARY IMAGING WITH GALLBLADDER EF TECHNIQUE: Sequential images of the abdomen were obtained out to 60 minutes following intravenous administration of radiopharmaceutical. After oral ingestion of Ensure, gallbladder ejection fraction was determined. At 60 min, normal ejection fraction is greater than 33%. RADIOPHARMACEUTICALS:  Five mCi Tc-68m  Choletec IV  COMPARISON:  Ultrasound from the previous day. FINDINGS: Prompt uptake and biliary excretion of activity by the liver is seen. Gallbladder activity is visualized, consistent with patency of cystic duct. Biliary activity passes into small bowel, consistent with patent common bile duct. Calculated gallbladder ejection fraction is 17%. (Normal gallbladder ejection fraction with Ensure is greater than 33%.) IMPRESSION: Normal uptake and excretion of biliary tracer. Reduced gallbladder ejection fraction of 17%. Electronically Signed   By: Inez Catalina M.D.   On: 01/26/2017 20:09   US Abdomen Limited Ruq  Result Date: 01/25/2017 CLINICAL DATA:  83 year old male with right-sided abdominal pain. EXAM: US ABDOMEN LIMITED - RIGHT UPPER QUADRANT COMPARISON:  CT dated 01/25/2017 FINDINGS: Gallbladder: There is sludge and stones within the gallbladder. No gallbladder wall thickening or pericholecystic fluid. Negative sonographic Murphy's sign. Common bile duct: Diameter: 5 mm Liver: There is diffuse increased liver echogenicity most compatible with fatty infiltration. Superimposed inflammatory or fibrotic changes is not excluded. The main portal vein is patent with appropriate flow direction. There is a 4.4 x 4.3 x 4.1 cm right renal upper pole cyst. IMPRESSION: 1. Cholelithiasis without sonographic evidence of acute cholecystitis. A hepatobiliary scintigraphy may provide better evaluation of the gallbladder if an acute cholecystitis is clinically suspected. 2. Fatty liver. Electronically Signed   By: Anner Crete M.D.   On: 01/25/2017 21:03    Anti-infectives: Anti-infectives    Start     Dose/Rate Route Frequency Ordered Stop   01/27/17 1015  ciprofloxacin (CIPRO) IVPB 400 mg     400 mg 200 mL/hr over 60 Minutes Intravenous  Once 01/27/17 1009        Assessment/Plan: Resolved choledocholithiasis Biliary colic  Plan lap chole with ioc today.  The patient was given Neurosurgeon.  We discussed the  risks and benefits of a laparoscopic cholecystectomy and possible cholangiogram including, but not limited to bleeding, infection, injury to surrounding structures such as the intestine or liver, bile leak, retained gallstones, need to convert to an open procedure, prolonged diarrhea, blood clots such as  DVT, common bile duct injury, anesthesia risks, and possible need for additional procedures.  The likelihood of improvement in symptoms and return to the patient's normal status is good. We discussed the typical post-operative recovery course.   [facility] 01/27/2017

## 2017-01-27 NOTE — Progress Notes (Signed)
PROGRESS NOTE    Jeremiah Zimmerman  DTO:[id number] DOB: [birth year] DOA: 01/25/2017 PCP: Precious Reel, MD   Brief Narrative: Jeremiah Zimmerman is a 83 y.o. male with a history of BPH, LBBB, hypertension, hyperlipidemia and bladder cancer. He presented with intermittent RUQ abdominal pain.   Assessment & Plan:   Principal Problem:   Right upper quadrant abdominal pain Active Problems:   Essential hypertension   Elevated LFTs   RUQ abdominal pain Nausea/vomiting Sounds like biliary colic, however, patient with elevating transaminases and alkaline phosphatase, in addition to elevated bilirubin. Cholelithiasis seen on ultrasound -GI and General surgery on board and pt undergoing further work up.  Essential hypertension - will continue to monitor if persists will start antihypertensive regimen.  BPH Chronic. Stable. -continue tamsulosin  Chronic diastolic heart failure Last EF of 55% with grade 2 diastolic dysfunction seen on echo from 10/28/2010   DVT prophylaxis: Lovenox Code Status: Full code Family Communication: Daughter at bedside Disposition Plan: Discharge pending workup for possible gallbladder pathology   Consultants:   [facility]  Gen surg  Procedures:   None  Antimicrobials:  None    Subjective: Pt resting comfortably on exam. Sedated from recent evaluation   Objective: Vitals:   01/27/17 1504 01/27/17 1515 01/27/17 1529 01/27/17 1543  BP: (!) 169/68   (!) 172/61  Pulse: 77 74 80 73  Resp: 12 12 17 18   Temp:   98.2 F (36.8 C) 97.8 F (36.6 C)  TempSrc:    Axillary  SpO2: 97% 97% 92% 96%  Weight:      Height:        Intake/Output Summary (Last 24 hours) at 01/27/17 1706 Last data filed at 01/27/17 1400  Gross per 24 hour  Intake              800 ml  Output              925 ml  Net             -125 ml   Filed Weights   01/25/17 1728 01/25/17 2315  Weight: 77.1 kg (170 lb) 77.1 kg (170 lb)    Examination:  General exam: resting  comfortably, in nad. HEENT: no scleral icterus Respiratory system: No increased work of breathing, equal chest rise Cardiovascular system: No cyanosis , pink extremities Gastrointestinal system: Abdomen is nondistended, soft and nontender.  Central nervous system: Unable to accurately assess due to sedation Extremities: No edema. No calf tenderness Skin: No cyanosis. No rashes Psychiatry: Unable to assess due to sedation  Data Reviewed: I have personally reviewed following labs and imaging studies  CBC:  Recent Labs Lab 01/25/17 1730 01/27/17 0255  WBC 8.0 4.4  HGB 15.0 13.3  HCT 44.1 41.0  MCV 90.0 91.7  PLT 117* 73*   Basic Metabolic Panel:  Recent Labs Lab 01/25/17 1730 01/27/17 0255  NA 137 138  K 3.9 4.1  CL 104 107  CO2 25 24  GLUCOSE 131* 99  BUN 13 8  CREATININE 0.95 0.92  CALCIUM 8.9 8.5*   GFR: Estimated Creatinine Clearance: 65 mL/min (by C-G formula based on SCr of 0.92 mg/dL). Liver Function Tests:  Recent Labs Lab 01/25/17 1730 01/26/17 0528 01/27/17 0255  AST 108* 439* 189*  ALT 71* 464* 384*  ALKPHOS 88 121 122  BILITOT 1.7* 3.1* 1.6*  PROT 6.9 6.5 5.9*  ALBUMIN 3.8 3.4* 3.3*    Recent Labs Lab 01/25/17 1730  LIPASE 17   No  results for input(s): AMMONIA in the last 168 hours. Coagulation Profile: No results for input(s): INR, PROTIME in the last 168 hours. Cardiac Enzymes: No results for input(s): CKTOTAL, CKMB, CKMBINDEX, TROPONINI in the last 168 hours. BNP (last 3 results) No results for input(s): PROBNP in the last 8760 hours. HbA1C: No results for input(s): HGBA1C in the last 72 hours. CBG: No results for input(s): GLUCAP in the last 168 hours. Lipid Profile: No results for input(s): CHOL, HDL, LDLCALC, TRIG, CHOLHDL, LDLDIRECT in the last 72 hours. Thyroid Function Tests: No results for input(s): TSH, T4TOTAL, FREET4, T3FREE, THYROIDAB in the last 72 hours. Anemia Panel: No results for input(s): VITAMINB12, FOLATE,  FERRITIN, TIBC, IRON, RETICCTPCT in the last 72 hours. Sepsis Labs: No results for input(s): PROCALCITON, LATICACIDVEN in the last 168 hours.  No results found for this or any previous visit (from the past 240 hour(s)).       Radiology Studies: Ct Abdomen Pelvis W Contrast  Result Date: 01/25/2017 CLINICAL DATA:  83 y/o M; 1 day of abdominal pain. History of bladder cancer status post TURBT in 2010. EXAM: CT ABDOMEN AND PELVIS WITH CONTRAST TECHNIQUE: Multidetector CT imaging of the abdomen and pelvis was performed using the standard protocol following bolus administration of intravenous contrast. CONTRAST:  156mL ISOVUE-300 IOPAMIDOL (ISOVUE-300) INJECTION 61% COMPARISON:  11/30/2016 CT of the abdomen and pelvis. FINDINGS: Lower chest: Mild coronary artery calcification. Stable 3 mm nodule within periphery of right lower lobe (series 201, image 3). Hepatobiliary: Stable subcentimeter lucencies in segment 2 of the liver (series 201, image 16 and 18) probably representing cysts. No new focal liver lesion. Normal gallbladder. No intra or extrahepatic biliary ductal dilatation. Pancreas: Unremarkable. No pancreatic ductal dilatation or surrounding inflammatory changes. Spleen: Normal in size without focal abnormality. Adrenals/Urinary Tract: Fluid attenuating homogeneous well-circumscribed foci within the kidneys bilaterally the largest in the right kidney upper pole measuring 4.5 cm are compatible with cysts. Subcentimeter lucencies within the kidneys are too small to characterize, but also probably represent cysts. No hydronephrosis. No urinary stone disease. No hydronephrosis. Mild stable bladder wall thickening without nodularity, possibly due to underdistention. Stomach/Bowel: Stomach is within normal limits. Appendix not identified. No evidence of bowel wall thickening, distention, or inflammatory changes. Vascular/Lymphatic: Aortic atherosclerosis. No enlarged abdominal or pelvic lymph nodes.  Reproductive: Severe prostate enlargement is stable. Other: No abdominal wall hernia or abnormality. No abdominopelvic ascites. Musculoskeletal: No acute or significant osseous findings. IMPRESSION: 1. No acute process identified as explanation for pain. 2. Stable 3 mm nodule within periphery of right lower lobe. 3. Stable nonspecific subcentimeter liver lucencies in segment 2, likely cysts. 4. Stable renal cysts and additional subcentimeter lucencies too small to characterize, but probably also cysts. 5. Stable severe prostate enlargement. 6. Stable aortic atherosclerosis. Electronically Signed   By: Kristine Garbe M.D.   On: 01/25/2017 19:03   Nm Hepato W/eject Fract  Result Date: 01/26/2017 CLINICAL DATA:  Right upper quadrant pain EXAM: NUCLEAR MEDICINE HEPATOBILIARY IMAGING WITH GALLBLADDER EF TECHNIQUE: Sequential images of the abdomen were obtained out to 60 minutes following intravenous administration of radiopharmaceutical. After oral ingestion of Ensure, gallbladder ejection fraction was determined. At 60 min, normal ejection fraction is greater than 33%. RADIOPHARMACEUTICALS:  Five mCi Tc-84m  Choletec IV COMPARISON:  Ultrasound from the previous day. FINDINGS: Prompt uptake and biliary excretion of activity by the liver is seen. Gallbladder activity is visualized, consistent with patency of cystic duct. Biliary activity passes into small bowel, consistent with patent common  bile duct. Calculated gallbladder ejection fraction is 17%. (Normal gallbladder ejection fraction with Ensure is greater than 33%.) IMPRESSION: Normal uptake and excretion of biliary tracer. Reduced gallbladder ejection fraction of 17%. Electronically Signed   By: Inez Catalina M.D.   On: 01/26/2017 20:09   US Abdomen Limited Ruq  Result Date: 01/25/2017 CLINICAL DATA:  83 year old male with right-sided abdominal pain. EXAM: US ABDOMEN LIMITED - RIGHT UPPER QUADRANT COMPARISON:  CT dated 01/25/2017 FINDINGS:  Gallbladder: There is sludge and stones within the gallbladder. No gallbladder wall thickening or pericholecystic fluid. Negative sonographic Murphy's sign. Common bile duct: Diameter: 5 mm Liver: There is diffuse increased liver echogenicity most compatible with fatty infiltration. Superimposed inflammatory or fibrotic changes is not excluded. The main portal vein is patent with appropriate flow direction. There is a 4.4 x 4.3 x 4.1 cm right renal upper pole cyst. IMPRESSION: 1. Cholelithiasis without sonographic evidence of acute cholecystitis. A hepatobiliary scintigraphy may provide better evaluation of the gallbladder if an acute cholecystitis is clinically suspected. 2. Fatty liver. Electronically Signed   By: Anner Crete M.D.   On: 01/25/2017 21:03    Scheduled Meds: . enoxaparin (LOVENOX) injection  40 mg Subcutaneous QHS  . tamsulosin  0.4 mg Oral Daily   Continuous Infusions:   LOS: 0 days    Velvet Bathe [facility] 01/27/2017, 5:06 PM Pager: [phone]  If 7PM-7AM, please contact night-coverage [email or web address] Password [facility] 01/27/2017, 5:06 PM

## 2017-01-27 NOTE — Anesthesia Preprocedure Evaluation (Signed)
Anesthesia Evaluation  Patient identified by MRN, date of birth, ID band Patient awake    Reviewed: Allergy & Precautions, H&P , NPO status , Patient's Chart, lab work & pertinent test results  History of Anesthesia Complications Negative for: history of anesthetic complications  Airway Mallampati: II  TM Distance: >3 FB Neck ROM: Full    Dental no notable dental hx. (+) Edentulous Upper, Edentulous Lower   Pulmonary neg pulmonary ROS,    Pulmonary exam normal breath sounds clear to auscultation       Cardiovascular hypertension, negative cardio ROS Normal cardiovascular exam+ Valvular Problems/Murmurs MVP  Rhythm:Regular Rate:Normal  LBBB    Neuro/Psych negative neurological ROS  negative psych ROS   GI/Hepatic Neg liver ROS,   Endo/Other  negative endocrine ROS  Renal/GU negative Renal ROS  negative genitourinary   Musculoskeletal negative musculoskeletal ROS (+)   Abdominal   Peds negative pediatric ROS (+)  Hematology negative hematology ROS (+)   Anesthesia Other Findings   Reproductive/Obstetrics negative OB ROS                             Anesthesia Physical  Anesthesia Plan  ASA: III  Anesthesia Plan: General   Post-op Pain Management:    Induction: Intravenous  Airway Management Planned: LMA  Additional Equipment:   Intra-op Plan:   Post-operative Plan: Extubation in OR  Informed Consent: I have reviewed the patients History and Physical, chart, labs and discussed the procedure including the risks, benefits and alternatives for the proposed anesthesia with the patient or authorized representative who has indicated his/her understanding and acceptance.   Dental advisory given  Plan Discussed with: CRNA, Anesthesiologist and Surgeon  Anesthesia Plan Comments:         Anesthesia Quick Evaluation

## 2017-01-27 NOTE — Transfer of Care (Signed)
Immediate Anesthesia Transfer of Care Note  Patient: Jeremiah Zimmerman  Procedure(s) Performed: Procedure(s): LAPAROSCOPIC CHOLECYSTECTOMY WITH INTRAOPERATIVE CHOLANGIOGRAM (N/A)  Patient Location: PACU  Anesthesia Type:General  Level of Consciousness: awake and alert   Airway & Oxygen Therapy: Patient Spontanous Breathing and Patient connected to nasal cannula oxygen  Post-op Assessment: Report given to RN and Post -op Vital signs reviewed and stable  Post vital signs: Reviewed and stable  Last Vitals:  Vitals:   01/27/17 0800 01/27/17 1418  BP: (!) 132/53 (!) 189/79  Pulse: (!) 58 71  Resp: 18 13  Temp: 36.6 C (!) 36 C    Last Pain:  Vitals:   01/27/17 0800  TempSrc: Oral  PainSc: 0-No pain      Patients Stated Pain Goal: 0 (77/82/42 3536)  Complications: No apparent anesthesia complications

## 2017-01-27 NOTE — Anesthesia Postprocedure Evaluation (Addendum)
Anesthesia Post Note  Patient: Jeremiah Zimmerman  Procedure(s) Performed: Procedure(s) (LRB): LAPAROSCOPIC CHOLECYSTECTOMY WITH INTRAOPERATIVE CHOLANGIOGRAM (N/A)  Patient location during evaluation: PACU Anesthesia Type: General Level of consciousness: sedated Pain management: pain level controlled Vital Signs Assessment: post-procedure vital signs reviewed and stable Respiratory status: spontaneous breathing and respiratory function stable Cardiovascular status: stable Anesthetic complications: no       Last Vitals:  Vitals:   01/27/17 1441 01/27/17 1445  BP:    Pulse: 73 78  Resp: 12 13  Temp:      Last Pain:  Vitals:   01/27/17 0800  TempSrc: Oral  PainSc: 0-No pain                 Kiyonna Tortorelli DANIEL

## 2017-01-27 NOTE — Progress Notes (Signed)
Pt returned from surgical procedure drowsy with no noted distress. Pt does follow commands. Noted x5 surgical sites to abdomen, steri strips clean, dry and intact. Safety measures in place. Call bell within reach. Received report prior to pt arrival to unit.  No family at bedside. Will continue to monitor.

## 2017-01-27 NOTE — Progress Notes (Signed)
Pt transferring to OR at this time. Report called in to nurse Horris Latino.

## 2017-01-27 NOTE — Op Note (Signed)
Preoperative diagnosis: Choledocholithiasis Postoperative diagnosis: Same as above Procedure: Laparoscopic cholecystectomy with cholangiogram Surgeon: Dr. Serita Grammes Anesthesia: Gen. Estimated blood loss: minimal Drains: None Complications: None Specimens: Gallbladder and contents to pathology. Sponge count was correct at completion Dietitian to recovery stable  Indications: This a 71 yom with gallstones on [place] and appears to have passed a gallstone. I discussed lap chole with he and his family.   Procedure: After informed consent was obtained the patient was taken to the operating room. He had been given antibiotics. SCDs were in place. He was placed under general anesthesia without complication. His abdomen was prepped and draped in the standard sterile surgical fashion. A surgical timeout was then performed.  I infiltrated Marcaine below his umbilicus. I then made a vertical incision. I grasped the fascia and I incised it. I then entered into the peritoneum bluntly. There was no evidence of an entry injury. I then placed a 0 Vicryl purse string suture. I inserted a Hasson trocar and insufflated the abdomen to 15 mm Hg pressure. I then inserted three further five mm trocars in the epigastrium and ruq.  I then retracted cephalad. He had a lot of adhesions from his omentum and his duodenum inferiorly. I was able to obtain the critical view of safety.  I clipped the cystic artery and divided it.  I then placed a clip on the distal cystic duct.  I then made a ductotomy and then inserted a cholangiocatheter.  I clipped this in position.  I did a cholangiogram.  I was in the cystic duct. I had filling of the liver on both sides. No stones were identified and the duodenum filled.  I then removed the catheter and clipped the duct three times and divided it.  The duct was viable and the clips traversed the duct.  I then removed the gallbladder from the liver bed. I placed it in a bag and removed it  via the umbilicus.  I then obtained hemostasis.  Irrigation was performed.  I placed surgicel snow in the liver bed.  I then removed the hasson trocar.  I tied the pursestring down.  I placed an additional 2 0 vicryl sutures in the umbilicus with the endoclose device. I then removed the remaining trocars. I closed the incisions with 4-0 monocryl and glue. He tolerated well and was transferred to recovery stable.

## 2017-01-28 ENCOUNTER — Encounter (HOSPITAL_COMMUNITY): Payer: Self-pay | Admitting: General Surgery

## 2017-01-28 DIAGNOSIS — R1011 Right upper quadrant pain: Secondary | ICD-10-CM | POA: Diagnosis not present

## 2017-01-28 MED ORDER — OXYCODONE-ACETAMINOPHEN 5-325 MG PO TABS: 1.0000 | ORAL_TABLET | ORAL | 0 refills | Status: DC | PRN

## 2017-01-28 NOTE — Progress Notes (Signed)
Pt D/C home, no new concerns, D/C instructions done with teach back, pt verbalize understanding.

## 2017-01-28 NOTE — Care Management Obs Status (Signed)
[place] NOTIFICATION   Patient Details  Name: Jeremiah Zimmerman MRN: [id number] Date of Birth: Jul 10, 1934   Medicare Observation Status Notification Given:  Yes    Pollie Friar, RN 01/28/2017, 11:06 AM

## 2017-01-28 NOTE — Progress Notes (Signed)
1 Day Post-Op  Subjective: tol diet, voiding, pain controlled, nausea resolved, discussed operative findings  Objective: Vital signs in last 24 hours: Temp:  [96.8 F (36 C)-98.6 F (37 C)] 98.6 F (37 C) (03/15 0541) Pulse Rate:  [66-80] 66 (03/15 0541) Resp:  [12-18] 18 (03/15 0541) BP: (135-189)/(59-79) 135/59 (03/15 0541) SpO2:  [91 %-99 %] 94 % (03/15 0541) Last BM Date: 01/24/17  Intake/Output from previous day: 03/14 0701 - 03/15 0700 In: 800 [I.V.:800] Out: 1175 [Urine:1150; Blood:25] Intake/Output this shift: No intake/output data recorded.  GI: soft approp tender incisions clean  Lab Results:   Recent Labs  01/25/17 1730 01/27/17 0255  WBC 8.0 4.4  HGB 15.0 13.3  HCT 44.1 41.0  PLT 117* 73*   BMET  Recent Labs  01/25/17 1730 01/27/17 0255  NA 137 138  K 3.9 4.1  CL 104 107  CO2 25 24  GLUCOSE 131* 99  BUN 13 8  CREATININE 0.95 0.92  CALCIUM 8.9 8.5*   PT/INR No results for input(s): LABPROT, INR in the last 72 hours. ABG No results for input(s): PHART, HCO3 in the last 72 hours.  Invalid input(s): PCO2, PO2  Studies/Results: Dg Cholangiogram Operative  Result Date: 01/27/2017 CLINICAL DATA:  Cholelithiasis EXAM: INTRAOPERATIVE CHOLANGIOGRAM TECHNIQUE: Cholangiographic images from the C-arm fluoroscopic device were submitted for interpretation post-operatively. Please see the procedural report for the amount of contrast and the fluoroscopy time utilized. COMPARISON:  01/26/2017 FINDINGS: Intraoperative cholangiogram performed during the laparoscopic cholecystectomy. The cystic duct, common hepatic duct, and common bile duct are patent. Contrast drains into the duodenum. No dilatation, obstruction, stricture, or significant filling defect. IMPRESSION: Patent biliary system. Electronically Signed   By: Jerilynn Mages.  Shick M.D.   On: 01/27/2017 17:09   Nm Hepato W/eject Fract  Result Date: 01/26/2017 CLINICAL DATA:  Right upper quadrant pain EXAM: NUCLEAR  MEDICINE HEPATOBILIARY IMAGING WITH GALLBLADDER EF TECHNIQUE: Sequential images of the abdomen were obtained out to 60 minutes following intravenous administration of radiopharmaceutical. After oral ingestion of Ensure, gallbladder ejection fraction was determined. At 60 min, normal ejection fraction is greater than 33%. RADIOPHARMACEUTICALS:  Five mCi Tc-27m  Choletec IV COMPARISON:  Ultrasound from the previous day. FINDINGS: Prompt uptake and biliary excretion of activity by the liver is seen. Gallbladder activity is visualized, consistent with patency of cystic duct. Biliary activity passes into small bowel, consistent with patent common bile duct. Calculated gallbladder ejection fraction is 17%. (Normal gallbladder ejection fraction with Ensure is greater than 33%.) IMPRESSION: Normal uptake and excretion of biliary tracer. Reduced gallbladder ejection fraction of 17%. Electronically Signed   By: Inez Catalina M.D.   On: 01/26/2017 20:09    Anti-infectives: Anti-infectives    Start     Dose/Rate Route Frequency Ordered Stop   01/27/17 1045  ciprofloxacin (CIPRO) IVPB 400 mg     400 mg 200 mL/hr over 60 Minutes Intravenous  Once 01/27/17 1009 01/27/17 1258      Assessment/Plan: POD 1 lap chole   Can dc home from surgical standpoint f/u surgery in 3 weeks   [facility] 01/28/2017

## 2017-01-28 NOTE — Progress Notes (Signed)
Patient ambulated in hallway with rolling walker and standby assist from RN approximately 15 yards.  Minimal complaints of pain/ discomfort in abdomen.  Continue to monitor.

## 2017-01-28 NOTE — Care Management Note (Signed)
Case Management Note  Patient Details  Name: Jeremiah Zimmerman MRN: [id number] Date of Birth: 08-02-1934  Subjective/Objective:                    Action/Plan: Pt discharging home with self care. Pt has PCP, insurance and transportation home. No further needs per CM.   Expected Discharge Date:  01/28/17               Expected Discharge Plan:  Home/Self Care  In-House Referral:     Discharge planning Services     Post Acute Care Choice:    Choice offered to:     DME Arranged:    DME Agency:     HH Arranged:    HH Agency:     Status of Service:  Completed, signed off  If discussed at [employer] of Stay Meetings, dates discussed:    Additional Comments:  Pollie Friar, RN 01/28/2017, 1:54 PM

## 2017-01-28 NOTE — Discharge Instructions (Signed)
[facility] LAPAROSCOPIC SURGERY: POST OP INSTRUCTIONS  Always review your discharge instruction sheet given to you by the facility where your surgery was performed. IF YOU HAVE DISABILITY OR FAMILY LEAVE FORMS, YOU MUST BRING THEM TO THE OFFICE FOR PROCESSING.   DO NOT GIVE THEM TO YOUR DOCTOR.  1. A prescription for pain medication may be given to you upon discharge.  Take your pain medication as prescribed, if needed.  If narcotic pain medicine is not needed, then you may take acetaminophen (Tylenol), naprosyn (Alleve), or ibuprofen (Advil) as needed. 2. Take your usually prescribed medications unless otherwise directed. 3. If you need a refill on your pain medication, please contact your pharmacy.  They will contact our office to request authorization. Prescriptions will not be filled after 5pm or on week-ends. 4. You should follow a light diet the first few days after arrival home, such as soup and crackers, etc.  Be sure to include lots of fluids daily. 5. Most patients will experience some swelling and bruising in the area of the incisions.  Ice packs will help.  Swelling and bruising can take several days to resolve.  6. It is common to experience some constipation if taking pain medication after surgery.  Increasing fluid intake and taking a stool softener (such as Colace) will usually help or prevent this problem from occurring.  A mild laxative (Milk of Magnesia or Miralax) should be taken according to package instructions if there are no bowel movements after 48 hours. 7. Unless discharge instructions indicate otherwise, you may remove your bandages 48 hours after surgery, and you may shower at that time.  You may have steri-strips (small skin tapes) in place directly over the incision.  These strips should be left on the skin for 7-10 days.  If your surgeon used skin glue on the incision, you may shower in 24 hours.  The glue will flake  off over the next 2-3 weeks.  Any sutures or staples will be removed at the office during your follow-up visit. 8. ACTIVITIES:  You may resume regular (light) daily activities beginning the next day--such as daily self-care, walking, climbing stairs--gradually increasing activities as tolerated.  You may have sexual intercourse when it is comfortable.  Refrain from any heavy lifting or straining until approved by your doctor. a. You may drive when you are no longer taking prescription pain medication, you can comfortably wear a seatbelt, and you can safely maneuver your car and apply brakes. b. RETURN TO WORK:  __________________________________________________________ 9. You should see your doctor in the office for a follow-up appointment approximately 2-3 weeks after your surgery.  Make sure that you call for this appointment within a day or two after you arrive home to insure a convenient appointment time. 10. OTHER INSTRUCTIONS: __________________________________________________________________________________________________________________________ __________________________________________________________________________________________________________________________ WHEN TO CALL YOUR DOCTOR: 1. Fever over 101.0 2. Inability to urinate 3. Continued bleeding from incision. 4. Increased pain, redness, or drainage from the incision. 5. Increasing abdominal pain  The clinic staff is available to answer your questions during regular business hours.  Please dont hesitate to call and ask to speak to one of the nurses for clinical concerns.  If you have a medical emergency, go to the nearest emergency room or call 911.  A surgeon from [facility] is always on call at the hospital. [address], [place], [place], [place]  [address] ? [address], [place], [place]   [address] [phone] ? [phone] ? FAX [phone] Web site:  [email or web address]   Laparoscopic Cholecystectomy, Care After This sheet gives you information about how to care for yourself after your procedure. Your health care provider may also give you more specific instructions. If you have problems or questions, contact your health care provider. What can I expect after the procedure? After the procedure, it is common to have:  Pain at your incision sites. You will be given medicines to control this pain.  Mild nausea or vomiting.  Bloating and possible shoulder pain from the air-like gas that was used during the procedure. Follow these instructions at home: Incision care    Follow instructions from your health care provider about how to take care of your incisions. Make sure you:  Wash your hands with soap and water before you change your bandage (dressing). If soap and water are not available, use hand sanitizer.  Change your dressing as told by your health care provider.  Leave stitches (sutures), skin glue, or adhesive strips in place. These skin closures may need to be in place for 2 weeks or longer. If adhesive strip edges start to loosen and curl up, you may trim the loose edges. Do not remove adhesive strips completely unless your health care provider tells you to do that.  Do not take baths, swim, or use a hot tub until your health care provider approves. Ask your health care provider if you can take showers. You may only be allowed to take sponge baths for bathing.  Check your incision area every day for signs of infection. Check for:  More redness, swelling, or pain.  More fluid or blood.  Warmth.  Pus or a bad smell. Activity   Do not drive or use heavy machinery while taking prescription pain medicine.  Do not lift anything that is heavier than 10 lb (4.5 kg) until your health care provider approves.  Do not play contact sports until your health care provider approves.  Do not drive for 24 hours if you  were given a medicine to help you relax (sedative).  Rest as needed. Do not return to work or school until your health care provider approves. General instructions   Take over-the-counter and prescription medicines only as told by your health care provider.  To prevent or treat constipation while you are taking prescription pain medicine, your health care provider may recommend that you:  Drink enough fluid to keep your urine clear or pale yellow.  Take over-the-counter or prescription medicines.  Eat foods that are high in fiber, such as fresh fruits and vegetables, whole grains, and beans.  Limit foods that are high in fat and processed sugars, such as fried and sweet foods. Contact a health care provider if:  You develop a rash.  You have more redness, swelling, or pain around your incisions.  You have more fluid or blood coming from your incisions.  Your incisions feel warm to the touch.  You have pus or a bad smell coming from your incisions.  You have a fever.  One or more of your incisions breaks open. Get help right away if:  You have trouble breathing.  You have chest pain.  You have increasing pain in your shoulders.  You faint or feel dizzy when you stand.  You have severe pain in your abdomen.  You have nausea or vomiting that lasts for more than one day.  You have leg pain. This information is not intended to replace advice given to you by your health  care provider. Make sure you discuss any questions you have with your health care provider. Document Released: 11/02/2005 Document Revised: 05/23/2016 Document Reviewed: 04/20/2016 Elsevier Interactive Patient Education  2017 [employer].

## 2017-01-28 NOTE — Discharge Summary (Signed)
Physician Discharge Summary  Jeremiah Zimmerman:[id number] DOB: 1934-04-14 DOA: 01/25/2017  PCP: Precious Reel, MD  Admit date: 01/25/2017 Discharge date: 01/28/2017  Time spent: > 35 minutes  Recommendations for Outpatient Follow-up:  1. Ensure patient f/u with general surgery 3 wks after hospital d/c   Discharge Diagnoses:  Principal Problem:   Right upper quadrant abdominal pain Active Problems:   Essential hypertension   Elevated LFTs   Discharge Condition: stable  Diet recommendation: regular diet  Filed Weights   01/25/17 1728 01/25/17 2315  Weight: 77.1 kg (170 lb) 77.1 kg (170 lb)    History of present illness:  Jeremiah Zimmerman is a 83 y.o. male with a history of BPH, LBBB, hypertension, hyperlipidemia and bladder cancer. He presented with intermittent RUQ abdominal pain.  Pt had resolved choledocholithiasis with biliary colic. s/p lap chole  Hospital Course:  Choledocholithiasis - resolved - pt is s/p lap chole. Plan is to f/u with general surgery 3 weeks after d/c. Discharging on pain medication  Procedures:  Lap chole  Consultations:  General surgery  Discharge Exam: Vitals:   01/28/17 0541 01/28/17 1001  BP: (!) 135/59 (!) 139/54  Pulse: 66 71  Resp: 18 16  Temp: 98.6 F (37 C) 97.8 F (36.6 C)    General: Pt in nad, alert and awake Cardiovascular: rrr, no rubs Respiratory: no increased wob, no wheezes  Discharge Instructions   Discharge Instructions    Call MD for:  difficulty breathing, headache or visual disturbances    Complete by:  As directed    Call MD for:  extreme fatigue    Complete by:  As directed    Call MD for:  severe uncontrolled pain    Complete by:  As directed    Call MD for:  temperature >100.4    Complete by:  As directed    Diet - low sodium heart healthy    Complete by:  As directed    Discharge instructions    Complete by:  As directed    Follow up with general surgery in 3 wks   Increase activity slowly     Complete by:  As directed      Current Discharge Medication List    START taking these medications   Details  oxyCODONE-acetaminophen (PERCOCET/ROXICET) 5-325 MG tablet Take 1-2 tablets by mouth every 4 (four) hours as needed for moderate pain. Qty: 30 tablet, Refills: 0      CONTINUE these medications which have NOT CHANGED   Details  Multiple Vitamins-Minerals (PA MENS 50 PLUS VITAPAK PO) Take by mouth daily.      Tamsulosin HCl (FLOMAX) 0.4 MG CAPS Take 0.4 mg by mouth daily.     triamcinolone cream (KENALOG) 0.1 % Apply 1 application topically 2 (two) times daily as needed for irritation.       Allergies  Allergen Reactions  . Erythromycin Nausea Only    REACTION: Intolerance to this medication with upset stomach  . Nitroglycerin Other (See Comments)    REACTION: Intolerance to this medication.  It drops his BP too low and quickly.  . Sulfa Antibiotics Nausea Only and Rash  . Tetracyclines & Related Nausea Only  . Amoxicillin Diarrhea and Nausea Only  . Penicillins Swelling and Rash    REACTION: Patient develops a rash and swelling at injection site   Follow-up [facility] Follow up on 02/23/2017.   Specialty:  General Surgery Why:  Your appointment is at 10:00  AM, , be at the office 30 minutes early for check in.  Bring photo ID and insurance information. Contact information: [place] [address] [phone]            The results of significant diagnostics from this hospitalization (including imaging, microbiology, ancillary and laboratory) are listed below for reference.    Significant Diagnostic Studies: Dg Cholangiogram Operative  Result Date: 01/27/2017 CLINICAL DATA:  Cholelithiasis EXAM: INTRAOPERATIVE CHOLANGIOGRAM TECHNIQUE: Cholangiographic images from the C-arm fluoroscopic device were submitted for interpretation post-operatively. Please see the procedural report for the amount of contrast and  the fluoroscopy time utilized. COMPARISON:  01/26/2017 FINDINGS: Intraoperative cholangiogram performed during the laparoscopic cholecystectomy. The cystic duct, common hepatic duct, and common bile duct are patent. Contrast drains into the duodenum. No dilatation, obstruction, stricture, or significant filling defect. IMPRESSION: Patent biliary system. Electronically Signed   By: Jerilynn Mages.  Shick M.D.   On: 01/27/2017 17:09   Ct Abdomen Pelvis W Contrast  Result Date: 01/25/2017 CLINICAL DATA:  83 y/o M; 1 day of abdominal pain. History of bladder cancer status post TURBT in 2010. EXAM: CT ABDOMEN AND PELVIS WITH CONTRAST TECHNIQUE: Multidetector CT imaging of the abdomen and pelvis was performed using the standard protocol following bolus administration of intravenous contrast. CONTRAST:  165mL ISOVUE-300 IOPAMIDOL (ISOVUE-300) INJECTION 61% COMPARISON:  11/30/2016 CT of the abdomen and pelvis. FINDINGS: Lower chest: Mild coronary artery calcification. Stable 3 mm nodule within periphery of right lower lobe (series 201, image 3). Hepatobiliary: Stable subcentimeter lucencies in segment 2 of the liver (series 201, image 16 and 18) probably representing cysts. No new focal liver lesion. Normal gallbladder. No intra or extrahepatic biliary ductal dilatation. Pancreas: Unremarkable. No pancreatic ductal dilatation or surrounding inflammatory changes. Spleen: Normal in size without focal abnormality. Adrenals/Urinary Tract: Fluid attenuating homogeneous well-circumscribed foci within the kidneys bilaterally the largest in the right kidney upper pole measuring 4.5 cm are compatible with cysts. Subcentimeter lucencies within the kidneys are too small to characterize, but also probably represent cysts. No hydronephrosis. No urinary stone disease. No hydronephrosis. Mild stable bladder wall thickening without nodularity, possibly due to underdistention. Stomach/Bowel: Stomach is within normal limits. Appendix not identified.  No evidence of bowel wall thickening, distention, or inflammatory changes. Vascular/Lymphatic: Aortic atherosclerosis. No enlarged abdominal or pelvic lymph nodes. Reproductive: Severe prostate enlargement is stable. Other: No abdominal wall hernia or abnormality. No abdominopelvic ascites. Musculoskeletal: No acute or significant osseous findings. IMPRESSION: 1. No acute process identified as explanation for pain. 2. Stable 3 mm nodule within periphery of right lower lobe. 3. Stable nonspecific subcentimeter liver lucencies in segment 2, likely cysts. 4. Stable renal cysts and additional subcentimeter lucencies too small to characterize, but probably also cysts. 5. Stable severe prostate enlargement. 6. Stable aortic atherosclerosis. Electronically Signed   By: Kristine Garbe M.D.   On: 01/25/2017 19:03   Nm Hepato W/eject Fract  Result Date: 01/26/2017 CLINICAL DATA:  Right upper quadrant pain EXAM: NUCLEAR MEDICINE HEPATOBILIARY IMAGING WITH GALLBLADDER EF TECHNIQUE: Sequential images of the abdomen were obtained out to 60 minutes following intravenous administration of radiopharmaceutical. After oral ingestion of Ensure, gallbladder ejection fraction was determined. At 60 min, normal ejection fraction is greater than 33%. RADIOPHARMACEUTICALS:  Five mCi Tc-54m  Choletec IV COMPARISON:  Ultrasound from the previous day. FINDINGS: Prompt uptake and biliary excretion of activity by the liver is seen. Gallbladder activity is visualized, consistent with patency of cystic duct. Biliary activity passes into small  bowel, consistent with patent common bile duct. Calculated gallbladder ejection fraction is 17%. (Normal gallbladder ejection fraction with Ensure is greater than 33%.) IMPRESSION: Normal uptake and excretion of biliary tracer. Reduced gallbladder ejection fraction of 17%. Electronically Signed   By: Inez Catalina M.D.   On: 01/26/2017 20:09   US Abdomen Limited Ruq  Result Date:  01/25/2017 CLINICAL DATA:  83 year old male with right-sided abdominal pain. EXAM: US ABDOMEN LIMITED - RIGHT UPPER QUADRANT COMPARISON:  CT dated 01/25/2017 FINDINGS: Gallbladder: There is sludge and stones within the gallbladder. No gallbladder wall thickening or pericholecystic fluid. Negative sonographic Murphy's sign. Common bile duct: Diameter: 5 mm Liver: There is diffuse increased liver echogenicity most compatible with fatty infiltration. Superimposed inflammatory or fibrotic changes is not excluded. The main portal vein is patent with appropriate flow direction. There is a 4.4 x 4.3 x 4.1 cm right renal upper pole cyst. IMPRESSION: 1. Cholelithiasis without sonographic evidence of acute cholecystitis. A hepatobiliary scintigraphy may provide better evaluation of the gallbladder if an acute cholecystitis is clinically suspected. 2. Fatty liver. Electronically Signed   By: Anner Crete M.D.   On: 01/25/2017 21:03    Microbiology: No results found for this or any previous visit (from the past 240 hour(s)).   Labs: Basic Metabolic Panel:  Recent Labs Lab 01/25/17 1730 01/27/17 0255  NA 137 138  K 3.9 4.1  CL 104 107  CO2 25 24  GLUCOSE 131* 99  BUN 13 8  CREATININE 0.95 0.92  CALCIUM 8.9 8.5*   Liver Function Tests:  Recent Labs Lab 01/25/17 1730 01/26/17 0528 01/27/17 0255  AST 108* 439* 189*  ALT 71* 464* 384*  ALKPHOS 88 121 122  BILITOT 1.7* 3.1* 1.6*  PROT 6.9 6.5 5.9*  ALBUMIN 3.8 3.4* 3.3*    Recent Labs Lab 01/25/17 1730  LIPASE 17   No results for input(s): AMMONIA in the last 168 hours. CBC:  Recent Labs Lab 01/25/17 1730 01/27/17 0255  WBC 8.0 4.4  HGB 15.0 13.3  HCT 44.1 41.0  MCV 90.0 91.7  PLT 117* 73*   Cardiac Enzymes: No results for input(s): CKTOTAL, CKMB, CKMBINDEX, TROPONINI in the last 168 hours. BNP: BNP (last 3 results) No results for input(s): BNP in the last 8760 hours.  ProBNP (last 3 results) No results for input(s):  PROBNP in the last 8760 hours.  CBG: No results for input(s): GLUCAP in the last 168 hours.   Signed:  Velvet Bathe MD.  [facility] 01/28/2017, 1:29 PM

## 2017-04-17 NOTE — Addendum Note (Signed)
Addendum  created 04/17/17 1047 by Duane Boston, MD   Sign clinical note

## 2018-07-18 ENCOUNTER — Inpatient Hospital Stay (HOSPITAL_COMMUNITY): Payer: Medicare Other

## 2018-07-18 ENCOUNTER — Encounter (HOSPITAL_COMMUNITY): Payer: Self-pay

## 2018-07-18 ENCOUNTER — Other Ambulatory Visit: Payer: Self-pay

## 2018-07-18 ENCOUNTER — Emergency Department (HOSPITAL_COMMUNITY): Payer: Medicare Other

## 2018-07-18 ENCOUNTER — Inpatient Hospital Stay (HOSPITAL_COMMUNITY)
Admission: EM | Admit: 2018-07-18 | Discharge: 2018-07-21 | DRG: 390 | Disposition: A | Discharge status: Home / Self Care | Payer: Medicare Other | Attending: General Surgery | Admitting: General Surgery

## 2018-07-18 DIAGNOSIS — E785 Hyperlipidemia, unspecified: Secondary | ICD-10-CM | POA: Diagnosis present

## 2018-07-18 DIAGNOSIS — I341 Nonrheumatic mitral (valve) prolapse: Secondary | ICD-10-CM | POA: Diagnosis present

## 2018-07-18 DIAGNOSIS — Z79899 Other long term (current) drug therapy: Secondary | ICD-10-CM | POA: Diagnosis not present

## 2018-07-18 DIAGNOSIS — K56609 Unspecified intestinal obstruction, unspecified as to partial versus complete obstruction: Principal | ICD-10-CM | POA: Diagnosis present

## 2018-07-18 DIAGNOSIS — I10 Essential (primary) hypertension: Secondary | ICD-10-CM | POA: Diagnosis present

## 2018-07-18 DIAGNOSIS — N400 Enlarged prostate without lower urinary tract symptoms: Secondary | ICD-10-CM | POA: Diagnosis present

## 2018-07-18 DIAGNOSIS — Z8551 Personal history of malignant neoplasm of bladder: Secondary | ICD-10-CM | POA: Diagnosis not present

## 2018-07-18 DIAGNOSIS — R51 Headache: Secondary | ICD-10-CM | POA: Diagnosis not present

## 2018-07-18 DIAGNOSIS — Z882 Allergy status to sulfonamides status: Secondary | ICD-10-CM | POA: Diagnosis not present

## 2018-07-18 DIAGNOSIS — Z881 Allergy status to other antibiotic agents status: Secondary | ICD-10-CM

## 2018-07-18 DIAGNOSIS — Z880 Allergy status to penicillin: Secondary | ICD-10-CM

## 2018-07-18 DIAGNOSIS — Z0189 Encounter for other specified special examinations: Secondary | ICD-10-CM

## 2018-07-18 HISTORY — DX: Unspecified intestinal obstruction, unspecified as to partial versus complete obstruction: K56.609

## 2018-07-18 LAB — LIPASE, BLOOD: Lipase: 32 U/L (ref 11–51)

## 2018-07-18 LAB — CBC
HCT: 49.9 % (ref 39.0–52.0)
HEMOGLOBIN: 16 g/dL (ref 13.0–17.0)
MCH: 29.9 pg (ref 26.0–34.0)
MCHC: 32.1 g/dL (ref 30.0–36.0)
MCV: 93.1 fL (ref 78.0–100.0)
Platelets: 142 10*3/uL — ABNORMAL LOW (ref 150–400)
RBC: 5.36 MIL/uL (ref 4.22–5.81)
RDW: 12.7 % (ref 11.5–15.5)
WBC: 9.8 10*3/uL (ref 4.0–10.5)

## 2018-07-18 LAB — COMPREHENSIVE METABOLIC PANEL
ALBUMIN: 4.1 g/dL (ref 3.5–5.0)
ALK PHOS: 69 U/L (ref 38–126)
ALT: 15 U/L (ref 0–44)
ANION GAP: 11 (ref 5–15)
AST: 16 U/L (ref 15–41)
BILIRUBIN TOTAL: 1.5 mg/dL — AB (ref 0.3–1.2)
BUN: 11 mg/dL (ref 8–23)
CALCIUM: 9.1 mg/dL (ref 8.9–10.3)
CHLORIDE: 105 mmol/L (ref 98–111)
CO2: 26 mmol/L (ref 22–32)
Creatinine, Ser: 0.98 mg/dL (ref 0.61–1.24)
GFR calc non Af Amer: >60 mL/min (ref >60)
Glucose, Bld: 122 mg/dL — ABNORMAL HIGH (ref 70–99)
Potassium: 3.7 mmol/L (ref 3.5–5.1)
Sodium: 142 mmol/L (ref 135–145)
Total Protein: 7.1 g/dL (ref 6.5–8.1)

## 2018-07-18 LAB — URINALYSIS, ROUTINE W REFLEX MICROSCOPIC
Bilirubin Urine: NEGATIVE
Glucose, UA: NEGATIVE mg/dL
Hgb urine dipstick: NEGATIVE
Ketones, ur: NEGATIVE mg/dL
LEUKOCYTES UA: NEGATIVE
Nitrite: NEGATIVE
Protein, ur: NEGATIVE mg/dL
pH: 6 (ref 5.0–8.0)

## 2018-07-18 LAB — I-STAT CG4 LACTIC ACID, ED: LACTIC ACID, VENOUS: 1.42 mmol/L (ref 0.5–1.9)

## 2018-07-18 MED ORDER — METOPROLOL TARTRATE 5 MG/5ML IV SOLN: 5.0000 mg | Freq: Four times a day (QID) | INTRAVENOUS | Status: DC | PRN

## 2018-07-18 MED ORDER — ONDANSETRON HCL 4 MG/2ML IJ SOLN
4.0000 mg | Freq: Four times a day (QID) | INTRAMUSCULAR | Status: DC | PRN
  Administered 2018-07-18: 4 mg via INTRAVENOUS
  Filled 2018-07-18: qty 2

## 2018-07-18 MED ORDER — MORPHINE SULFATE (PF) 2 MG/ML IV SOLN: 2.0000 mg | INTRAVENOUS | Status: DC | PRN

## 2018-07-18 MED ORDER — IOPAMIDOL (ISOVUE-300) INJECTION 61%
100.0000 mL | Freq: Once | INTRAVENOUS | Status: AC | PRN
  Administered 2018-07-18: 100 mL via INTRAVENOUS

## 2018-07-18 MED ORDER — DIATRIZOATE MEGLUMINE & SODIUM 66-10 % PO SOLN
90.0000 mL | Freq: Once | ORAL | Status: AC
  Administered 2018-07-18: 90 mL via NASOGASTRIC
  Filled 2018-07-18: qty 90

## 2018-07-18 MED ORDER — TAMSULOSIN HCL 0.4 MG PO CAPS
0.4000 mg | ORAL_CAPSULE | Freq: Every day | ORAL | Status: DC
  Administered 2018-07-19 – 2018-07-21 (×3): 0.4 mg via ORAL
  Filled 2018-07-18 (×3): qty 1

## 2018-07-18 MED ORDER — FENTANYL CITRATE (PF) 100 MCG/2ML IJ SOLN
50.0000 ug | Freq: Once | INTRAMUSCULAR | Status: AC
  Administered 2018-07-18: 50 ug via INTRAVENOUS
  Filled 2018-07-18: qty 2

## 2018-07-18 MED ORDER — DIPHENHYDRAMINE HCL 12.5 MG/5ML PO ELIX: 12.5000 mg | ORAL_SOLUTION | Freq: Four times a day (QID) | ORAL | Status: DC | PRN

## 2018-07-18 MED ORDER — ONDANSETRON 4 MG PO TBDP: 4.0000 mg | ORAL_TABLET | Freq: Four times a day (QID) | ORAL | Status: DC | PRN

## 2018-07-18 MED ORDER — POTASSIUM CHLORIDE IN NACL 20-0.9 MEQ/L-% IV SOLN
INTRAVENOUS | Status: DC
  Administered 2018-07-18 – 2018-07-20 (×4): via INTRAVENOUS
  Filled 2018-07-18 (×5): qty 1000

## 2018-07-18 MED ORDER — ONDANSETRON HCL 4 MG/2ML IJ SOLN
4.0000 mg | Freq: Once | INTRAMUSCULAR | Status: AC
  Administered 2018-07-18: 4 mg via INTRAVENOUS
  Filled 2018-07-18: qty 2

## 2018-07-18 MED ORDER — ENOXAPARIN SODIUM 40 MG/0.4ML ~~LOC~~ SOLN
40.0000 mg | SUBCUTANEOUS | Status: DC
  Administered 2018-07-18 – 2018-07-20 (×3): 40 mg via SUBCUTANEOUS
  Filled 2018-07-18 (×4): qty 0.4

## 2018-07-18 MED ORDER — ACETAMINOPHEN 325 MG PO TABS
650.0000 mg | ORAL_TABLET | ORAL | Status: DC | PRN
  Administered 2018-07-18: 650 mg via ORAL
  Filled 2018-07-18: qty 2

## 2018-07-18 MED ORDER — DIPHENHYDRAMINE HCL 50 MG/ML IJ SOLN: 12.5000 mg | Freq: Four times a day (QID) | INTRAMUSCULAR | Status: DC | PRN

## 2018-07-18 MED ORDER — MORPHINE SULFATE (PF) 4 MG/ML IV SOLN
4.0000 mg | Freq: Once | INTRAVENOUS | Status: AC
  Administered 2018-07-18: 4 mg via INTRAVENOUS
  Filled 2018-07-18: qty 1

## 2018-07-18 MED ORDER — SODIUM CHLORIDE 0.9 % IV BOLUS
1000.0000 mL | Freq: Once | INTRAVENOUS | Status: AC
  Administered 2018-07-18: 1000 mL via INTRAVENOUS

## 2018-07-18 MED ORDER — ONDANSETRON HCL 4 MG/2ML IJ SOLN
4.0000 mg | Freq: Once | INTRAMUSCULAR | Status: AC
Start: 2018-07-18 — End: 2018-07-18
  Administered 2018-07-18: 4 mg via INTRAVENOUS
  Filled 2018-07-18: qty 2

## 2018-07-18 MED ORDER — IOPAMIDOL (ISOVUE-300) INJECTION 61%
INTRAVENOUS | Status: AC
  Filled 2018-07-18: qty 100

## 2018-07-18 NOTE — ED Provider Notes (Signed)
[facility] EMERGENCY DEPARTMENT Provider Note   CSN: [id number] Arrival date & time: 07/18/18  1761     History   Chief Complaint Chief Complaint  Patient presents with  . Abdominal Pain    HPI Jeremiah Zimmerman is a 84 y.o. male.  HPI   84 year old male with history above including history of bladder cancer, choledocholithiasis, laparoscopic cholecystectomy pending March 2018 who presents with concern for abdominal pain and nausea.  Patient reports eating barbecue on Saturday night, and after that developing abdominal pain, which improved, and returned last night after eating.  Reports that the pain is diffuse and bandlike, a cramping and tightness through his mid abdomen, and rated at 9 out of 10.  He reports nausea, but has been a unable to vomit.  He has not had any diarrhea, and reports no bowel movements today, and does not believe he is passed flatus.  Denies fevers, chills, urinary symptoms, chest pain.  Past Medical History:  Diagnosis Date  . Adenomatous colon polyp   . Bladder cancer ([place]) 2010  . BPH (benign prostatic hyperplasia)   . Heart murmur   . Hyperlipidemia   . Hypertension   . MVP (mitral valve prolapse) 01-13-13   hx. LBBB previous EKG 11'11  in Epic.  Marland Kitchen SBO (small bowel obstruction) ([place]) 07/18/2018  . Seasonal allergies 01-13-13    Patient Active Problem List   Diagnosis Date Noted  . Small bowel obstruction ([place]) 07/18/2018  . Right upper quadrant abdominal pain 01/25/2017  . Elevated LFTs 01/25/2017  . Acute medial meniscal tear 01/19/2013  . MUSCLE PAIN 10/15/2010  . OTHER MALAISE AND FATIGUE 10/15/2010  . DYSPNEA 10/15/2010  . BLADDER CANCER 10/14/2010  . HYPERLIPIDEMIA 10/14/2010  . Essential hypertension 10/14/2010  . BUNDLE BRANCH BLOCK, LEFT 10/14/2010  . GROSS HEMATURIA 10/14/2010    Past Surgical History:  Procedure Laterality Date  . APPENDECTOMY    . BLADDER SURGERY  2010   bladder cancer in 2010/Post surgery  bleed  . CARDIAC CATHETERIZATION  01-13-13   greater than 5 yrs ago-negative findings  . CHOLECYSTECTOMY N/A 01/27/2017   Procedure: LAPAROSCOPIC CHOLECYSTECTOMY WITH INTRAOPERATIVE CHOLANGIOGRAM;  Surgeon: Rolm Bookbinder, MD;  Location: [facility];  Service: General;  Laterality: N/A;  . COLONOSCOPY  01-13-13   hx.colon polyps excised-last 3-4 yrs ago  . ELBOW SURGERY     left  . KNEE ARTHROSCOPY WITH MEDIAL MENISECTOMY Right 01/20/2013   Procedure: KNEE ARTHROSCOPY WITH MEDIAL MENISECTOMY;  Surgeon: Gearlean Alf, MD;  Location: [facility];  Service: Orthopedics;  Laterality: Right;  right knee arthroscopy with medial meniscal debridement and chrondroplasty  . TONSILLECTOMY    . TONSILLECTOMY AND ADENOIDECTOMY          Home Medications    Prior to Admission medications   Medication Sig Start Date End Date Taking? Authorizing Provider  Omega-3 Fatty Acids (FISH OIL) 1000 MG CAPS Take 1,000 mg by mouth daily.   Yes [provider]  Tamsulosin HCl (FLOMAX) 0.4 MG CAPS Take 0.4 mg by mouth daily.    Yes [provider]  vitamin B-12 (CYANOCOBALAMIN) 500 MCG tablet Take 500 mcg by mouth daily.   Yes [provider]  oxyCODONE-acetaminophen (PERCOCET/ROXICET) 5-325 MG tablet Take 1-2 tablets by mouth every 4 (four) hours as needed for moderate pain. Patient not taking: Reported on 07/18/2018 01/28/17   Velvet Bathe, MD    Family History Family History  Problem Relation Age of Onset  . Breast cancer  Mother   . Prostate cancer Father   . Colon cancer Brother   . Cervical cancer Daughter   . Thyroid cancer Daughter   . Uterine cancer Daughter   . Prostate cancer Brother     Social History Social History   Tobacco Use  . Smoking status: Never Smoker  . Smokeless tobacco: Never Used  Substance Use Topics  . Alcohol use: No  . Drug use: No     Allergies   Erythromycin; Nitroglycerin; Sulfa antibiotics; Tetracyclines & related; Amoxicillin; and  Penicillins   Review of Systems Review of Systems  Constitutional: Negative for fever.  HENT: Negative for sore throat.   Eyes: Negative for visual disturbance.  Respiratory: Negative for shortness of breath.   Cardiovascular: Negative for chest pain.  Gastrointestinal: Positive for abdominal distention, abdominal pain and nausea. Negative for diarrhea and vomiting.  Genitourinary: Negative for difficulty urinating.  Musculoskeletal: Negative for back pain and neck stiffness.  Skin: Negative for rash.  Neurological: Negative for syncope and headaches.     Physical Exam Updated Vital Signs BP (!) 157/64 (BP Location: Left Arm)   Pulse 64   Temp 98.5 F (36.9 C) (Oral)   Resp 17   SpO2 93%   Physical Exam  Constitutional: He is oriented to person, place, and time. He appears well-developed and well-nourished. He appears ill (nausea). No distress.  HENT:  Head: Normocephalic and atraumatic.  Eyes: Conjunctivae and EOM are normal.  Neck: Normal range of motion.  Cardiovascular: Normal rate, regular rhythm, normal heart sounds and intact distal pulses. Exam reveals no gallop and no friction rub.  No murmur heard. Pulmonary/Chest: Effort normal and breath sounds normal. No respiratory distress. He has no wheezes. He has no rales.  Abdominal: Soft. He exhibits distension. There is generalized tenderness. There is no guarding, no tenderness at McBurney's point and negative Murphy's sign.  Musculoskeletal: He exhibits no edema.  Neurological: He is alert and oriented to person, place, and time.  Skin: Skin is warm and dry. He is not diaphoretic.  Nursing note and vitals reviewed.    ED Treatments / Results  Labs (all labs ordered are listed, but only abnormal results are displayed) Labs Reviewed  COMPREHENSIVE METABOLIC PANEL - Abnormal; Notable for the following components:      Result Value   Glucose, Bld 122 (*)    Total Bilirubin 1.5 (*)    All other components within  normal limits  CBC - Abnormal; Notable for the following components:   Platelets 142 (*)    All other components within normal limits  URINALYSIS, ROUTINE W REFLEX MICROSCOPIC - Abnormal; Notable for the following components:   Specific Gravity, Urine >1.046 (*)    All other components within normal limits  LIPASE, BLOOD  BASIC METABOLIC PANEL  CBC  I-STAT CG4 LACTIC ACID, ED  I-STAT CG4 LACTIC ACID, ED    EKG None  Radiology Ct Abdomen Pelvis W Contrast  Result Date: 07/18/2018 CLINICAL DATA:  Pt c/o lower abdominal pain, nausea x 3 days; no prior abdominal surgeries. The patient has a history of bladder carcinoma and TURBT. Previous cholecystectomy, appendectomy. EXAM: CT ABDOMEN AND PELVIS WITH CONTRAST TECHNIQUE: Multidetector CT imaging of the abdomen and pelvis was performed using the standard protocol following bolus administration of intravenous contrast. CONTRAST:  171mL ISOVUE-300 IOPAMIDOL (ISOVUE-300) INJECTION 61%, <See Chart> ISOVUE-300 IOPAMIDOL (ISOVUE-300) INJECTION 61% COMPARISON:  CT of the abdomen on 05/31/2018 FINDINGS: Lower chest: There is bibasilar off subsegmental atelectasis. Heart size  is UPPER normal. There are focal atherosclerotic calcifications of the coronary arteries. Hepatobiliary: Status post cholecystectomy. A small low-attenuation lesion is identified in the LEFT hepatic lobe and appears stable. No suspicious liver lesions. Pancreas: Unremarkable. No pancreatic ductal dilatation or surrounding inflammatory changes. Spleen: Normal in size without focal abnormality. Adrenals/Urinary Tract: Adrenal glands are normal in appearance. RIGHT kidney: Numerous cysts, largest 3.9 centimeters. There is no hydronephrosis. LEFT kidney: Multiple parapelvic cyst. No solid mass or hydronephrosis. The bladder and visualized portion of the urethra are normal. Stomach/Bowel: There is marked distension of the stomach. There is partial malrotation of small bowel with jejunal loops  descending in the RIGHT mid abdomen. The proximal small bowel loops are mildly dilated. There is gradual transition to normal caliber small bowel loops without mass or abrupt transition zone. Distal small bowel loops are normal in caliber. There is fluid within the ascending colon. Moderate stool burden within the descending colon. No significant diverticulosis or acute diverticulitis. Vascular/Lymphatic: There is atherosclerotic calcification of the abdominal aorta, not associated with aneurysm. Reproductive: The prostate is enlarged. Prominent impression of the base of the bladder by the prostate gland. Other: Incidental note is made of an oval circumscribed calcification in the RIGHT hemipelvis, measuring 12 millimeters. Previously, this calcification was identified in the LOWER peritoneal cavity, just above the seminal vesicles. There is no associated fluid or stranding. Findings are benign. Musculoskeletal: Remote superior endplate fracture of L2. IMPRESSION: 1. Distended stomach and jejunum without discrete transition zone or mass. Findings favor early small bowel obstruction over focal ileus. 2. Partial malrotation of the small bowel, with dilated jejunal loops descending in the RIGHT mid abdomen. 3. Coronary artery calcifications. 4.  Aortic atherosclerosis.  (ICD10-I70.0) 5. Cholecystectomy. 6. Benign calcification within the RIGHT hemipelvis. 7. Enlarged prostate. Electronically Signed   By: Nolon Nations M.D.   On: 07/18/2018 10:32   Dg Abd Portable 1v-small Bowel Protocol-position Verification  Result Date: 07/18/2018 CLINICAL DATA:  Enteric tube placement. EXAM: PORTABLE ABDOMEN - 1 VIEW COMPARISON:  CT abdomen pelvis 07/18/2018 FINDINGS: ET tube tip and side-port project over the left upper quadrant. Gas is demonstrated within loops of bowel throughout the abdomen. Bibasilar heterogeneous opacities may represent atelectasis or scarring. IMPRESSION: Enteric tube tip and side-port project over the  stomach. Electronically Signed   By: Lovey Newcomer M.D.   On: 07/18/2018 13:38    Procedures Procedures (including critical care time)  Medications Ordered in ED Medications  iopamidol (ISOVUE-300) 61 % injection (has no administration in time range)  tamsulosin (FLOMAX) capsule 0.4 mg (0.4 mg Oral Not Given 07/18/18 1417)  enoxaparin (LOVENOX) injection 40 mg (40 mg Subcutaneous Given 07/18/18 1419)  0.9 % NaCl with KCl 20 mEq/ L  infusion ( Intravenous New Bag/Given 07/18/18 1411)  ondansetron (ZOFRAN-ODT) disintegrating tablet 4 mg ( Oral See Alternative 07/18/18 1419)    Or  ondansetron (ZOFRAN) injection 4 mg (4 mg Intravenous Given 07/18/18 1419)  morphine 2 MG/ML injection 2-4 mg (has no administration in time range)  diphenhydrAMINE (BENADRYL) 12.5 MG/5ML elixir 12.5 mg (has no administration in time range)    Or  diphenhydrAMINE (BENADRYL) injection 12.5 mg (has no administration in time range)  metoprolol tartrate (LOPRESSOR) injection 5 mg (has no administration in time range)  acetaminophen (TYLENOL) tablet 650 mg (has no administration in time range)  sodium chloride 0.9 % bolus 1,000 mL (0 mLs Intravenous Stopped 07/18/18 1100)  ondansetron (ZOFRAN) injection 4 mg (4 mg Intravenous Given 07/18/18 0922)  morphine  4 MG/ML injection 4 mg (4 mg Intravenous Given 07/18/18 0922)  iopamidol (ISOVUE-300) 61 % injection 100 mL (100 mLs Intravenous Contrast Given 07/18/18 1009)  fentaNYL (SUBLIMAZE) injection 50 mcg (50 mcg Intravenous Given 07/18/18 1229)  ondansetron (ZOFRAN) injection 4 mg (4 mg Intravenous Given 07/18/18 1229)  diatrizoate meglumine-sodium (GASTROGRAFIN) 66-10 % solution 90 mL (90 mLs Per NG tube Given 07/18/18 1508)     Initial Impression / Assessment and Plan / ED Course  I have reviewed the triage vital signs and the nursing notes.  Pertinent labs & imaging results that were available during my care of the patient were reviewed by me and considered in my medical decision making  (see chart for details).     84 year old male with history above including history of bladder cancer, choledocholithiasis, laparoscopic cholecystectomy pending March 2018 who presents with concern for abdominal pain and nausea.  Exam and hx concerning for SBO, less likely repeat choledocholithiasis.  CT abdomen pelvis shows concern for bowel obstruction, possible partial malrotation.  Consulted Dr. Georgette [facility] who came to bedside and admitted the patient for further care. Given IV fluids, zofran, morphine.   Final Clinical Impressions(s) / ED Diagnoses   Final diagnoses:  Encounter for imaging study to confirm nasogastric (NG) tube placement  Small bowel obstruction [facility])    ED Discharge Orders    None       Gareth Morgan, MD 07/18/18 2230

## 2018-07-18 NOTE — H&P (Signed)
Jeremiah Zimmerman is an 84 y.o. male.   Chief Complaint: Nausea, abdominal distention HPI: This is an 84 yo male with a history of bladder cancer s/p laparoscopic cholecystectomy in 3/18 by Dr. Donne Zimmerman presents with several days of abdominal distention and nausea.  He has not had a bowel movement in 2 days.  No flatus.  Quite distended.  He presented to the ED for evaluation.  Past Medical History:  Diagnosis Date  . Adenomatous colon polyp   . Bladder cancer ([facility]) 2010  . BPH (benign prostatic hyperplasia)   . Heart murmur   . Hyperlipidemia   . Hypertension   . MVP (mitral valve prolapse) 01-13-13   hx. LBBB previous EKG 11'11  in Epic.  . Seasonal allergies 01-13-13    Past Surgical History:  Procedure Laterality Date  . APPENDECTOMY    . BLADDER SURGERY  2010   bladder cancer in 2010/Post surgery bleed  . CARDIAC CATHETERIZATION  01-13-13   greater than 5 yrs ago-negative findings  . CHOLECYSTECTOMY N/A 01/27/2017   Procedure: LAPAROSCOPIC CHOLECYSTECTOMY WITH INTRAOPERATIVE CHOLANGIOGRAM;  Surgeon: Rolm Bookbinder, MD;  Location: [facility];  Service: General;  Laterality: N/A;  . COLONOSCOPY  01-13-13   hx.colon polyps excised-last 3-4 yrs ago  . ELBOW SURGERY     left  . KNEE ARTHROSCOPY WITH MEDIAL MENISECTOMY Right 01/20/2013   Procedure: KNEE ARTHROSCOPY WITH MEDIAL MENISECTOMY;  Surgeon: Gearlean Alf, MD;  Location: [facility];  Service: Orthopedics;  Laterality: Right;  right knee arthroscopy with medial meniscal debridement and chrondroplasty  . TONSILLECTOMY    . TONSILLECTOMY AND ADENOIDECTOMY      Family History  Problem Relation Age of Onset  . Breast cancer Mother   . Prostate cancer Father   . Colon cancer Brother   . Cervical cancer Daughter   . Thyroid cancer Daughter   . Uterine cancer Daughter   . Prostate cancer Brother    Social History:  reports that he has never smoked. He has never used smokeless tobacco. He reports that he does not drink alcohol or use  drugs.  Allergies:  Allergies  Allergen Reactions  . Erythromycin Nausea Only    REACTION: Intolerance to this medication with upset stomach  . Nitroglycerin Other (See Comments)    REACTION: Intolerance to this medication.  It drops his BP too low and quickly.  . Sulfa Antibiotics Nausea Only and Rash  . Tetracyclines & Related Nausea Only  . Amoxicillin Diarrhea and Nausea Only  . Penicillins Swelling and Rash    Has patient had a PCN reaction causing immediate rash, facial/tongue/throat swelling, SOB or lightheadedness with hypotension: Yes Has patient had a PCN reaction causing severe rash involving mucus membranes or skin necrosis: No Has patient had a PCN reaction that required hospitalization: No Has patient had a PCN reaction occurring within the last 10 years: No If all of the above answers are "NO", then may proceed with Cephalosporin use.   REACTION: Patient develops a rash and swelling at inject    Prior to Admission medications   Medication Sig Start Date End Date Taking? Authorizing Provider  Omega-3 Fatty Acids (FISH OIL) 1000 MG CAPS Take 1,000 mg by mouth daily.   Yes [provider]  Tamsulosin HCl (FLOMAX) 0.4 MG CAPS Take 0.4 mg by mouth daily.    Yes [provider]  vitamin B-12 (CYANOCOBALAMIN) 500 MCG tablet Take 500 mcg by mouth daily.   Yes [provider]  oxyCODONE-acetaminophen (PERCOCET/ROXICET)  5-325 MG tablet Take 1-2 tablets by mouth every 4 (four) hours as needed for moderate pain. Patient not taking: Reported on 07/18/2018 01/28/17   Velvet Bathe, MD     Results for orders placed or performed during the hospital encounter of 07/18/18 (from the past 48 hour(s))  Lipase, blood     Status: None   Collection Time: 07/18/18  8:31 AM  Result Value Ref Range   Lipase 32 11 - 51 U/L    Comment: Performed at [facility], [place] [address] [place], [place] [address]  Comprehensive metabolic panel     Status: Abnormal    Collection Time: 07/18/18  8:31 AM  Result Value Ref Range   Sodium 142 135 - 145 mmol/L   Potassium 3.7 3.5 - 5.1 mmol/L   Chloride 105 98 - 111 mmol/L   CO2 26 22 - 32 mmol/L   Glucose, Bld 122 (H) 70 - 99 mg/dL   BUN 11 8 - 23 mg/dL   Creatinine, Ser 0.98 0.61 - 1.24 mg/dL   Calcium 9.1 8.9 - 10.3 mg/dL   Total Protein 7.1 6.5 - 8.1 g/dL   Albumin 4.1 3.5 - 5.0 g/dL   AST 16 15 - 41 U/L   ALT 15 0 - 44 U/L   Alkaline Phosphatase 69 38 - 126 U/L   Total Bilirubin 1.5 (H) 0.3 - 1.2 mg/dL   GFR calc non Af Amer >60 >60 mL/min   GFR calc Af Amer >60 >60 mL/min    Comment: (NOTE) The eGFR has been calculated using the CKD EPI equation. This calculation has not been validated in all clinical situations. eGFR's persistently <60 mL/min signify possible Chronic Kidney Disease.    Anion gap 11 5 - 15    Comment: Performed at [facility] [address], [place], [place] [address]  CBC     Status: Abnormal   Collection Time: 07/18/18  8:31 AM  Result Value Ref Range   WBC 9.8 4.0 - 10.5 K/uL   RBC 5.36 4.22 - 5.81 MIL/uL   Hemoglobin 16.0 13.0 - 17.0 g/dL   HCT 49.9 39.0 - 52.0 %   MCV 93.1 78.0 - 100.0 fL   MCH 29.9 26.0 - 34.0 pg   MCHC 32.1 30.0 - 36.0 g/dL   RDW 12.7 11.5 - 15.5 %   Platelets 142 (L) 150 - 400 K/uL    Comment: Performed at [facility], [place] [address], [place], [place] [address]  I-Stat CG4 Lactic Acid, ED     Status: None   Collection Time: 07/18/18  9:12 AM  Result Value Ref Range   Lactic Acid, Venous 1.42 0.5 - 1.9 mmol/L   Ct Abdomen Pelvis W Contrast  Result Date: 07/18/2018 CLINICAL DATA:  Pt c/o lower abdominal pain, nausea x 3 days; no prior abdominal surgeries. The patient has a history of bladder carcinoma and TURBT. Previous cholecystectomy, appendectomy. EXAM: CT ABDOMEN AND PELVIS WITH CONTRAST TECHNIQUE: Multidetector CT imaging of the abdomen and pelvis was performed using the standard protocol following bolus administration of  intravenous contrast. CONTRAST:  113m ISOVUE-300 IOPAMIDOL (ISOVUE-300) INJECTION 61%, <See Chart> ISOVUE-300 IOPAMIDOL (ISOVUE-300) INJECTION 61% COMPARISON:  CT of the abdomen on 05/31/2018 FINDINGS: Lower chest: There is bibasilar off subsegmental atelectasis. Heart size is UPPER normal. There are focal atherosclerotic calcifications of the coronary arteries. Hepatobiliary: Status post cholecystectomy. A small low-attenuation lesion is identified in the LEFT hepatic lobe and appears stable. No suspicious  liver lesions. Pancreas: Unremarkable. No pancreatic ductal dilatation or surrounding inflammatory changes. Spleen: Normal in size without focal abnormality. Adrenals/Urinary Tract: Adrenal glands are normal in appearance. RIGHT kidney: Numerous cysts, largest 3.9 centimeters. There is no hydronephrosis. LEFT kidney: Multiple parapelvic cyst. No solid mass or hydronephrosis. The bladder and visualized portion of the urethra are normal. Stomach/Bowel: There is marked distension of the stomach. There is partial malrotation of small bowel with jejunal loops descending in the RIGHT mid abdomen. The proximal small bowel loops are mildly dilated. There is gradual transition to normal caliber small bowel loops without mass or abrupt transition zone. Distal small bowel loops are normal in caliber. There is fluid within the ascending colon. Moderate stool burden within the descending colon. No significant diverticulosis or acute diverticulitis. Vascular/Lymphatic: There is atherosclerotic calcification of the abdominal aorta, not associated with aneurysm. Reproductive: The prostate is enlarged. Prominent impression of the base of the bladder by the prostate gland. Other: Incidental note is made of an oval circumscribed calcification in the RIGHT hemipelvis, measuring 12 millimeters. Previously, this calcification was identified in the LOWER peritoneal cavity, just above the seminal vesicles. There is no associated fluid  or stranding. Findings are benign. Musculoskeletal: Remote superior endplate fracture of L2. IMPRESSION: 1. Distended stomach and jejunum without discrete transition zone or mass. Findings favor early small bowel obstruction over focal ileus. 2. Partial malrotation of the small bowel, with dilated jejunal loops descending in the RIGHT mid abdomen. 3. Coronary artery calcifications. 4.  Aortic atherosclerosis.  (ICD10-I70.0) 5. Cholecystectomy. 6. Benign calcification within the RIGHT hemipelvis. 7. Enlarged prostate. Electronically Signed   By: Nolon Nations M.D.   On: 07/18/2018 10:32    Review of Systems  Constitutional: Negative for weight loss.  HENT: Negative for ear discharge, ear pain, hearing loss and tinnitus.   Eyes: Negative for blurred vision, double vision, photophobia and pain.  Respiratory: Negative for cough, sputum production and shortness of breath.   Cardiovascular: Negative for chest pain.  Gastrointestinal: Positive for abdominal pain, constipation and nausea. Negative for vomiting.  Genitourinary: Negative for dysuria, flank pain, frequency and urgency.  Musculoskeletal: Negative for back pain, falls, joint pain, myalgias and neck pain.  Neurological: Negative for dizziness, tingling, sensory change, focal weakness, loss of consciousness and headaches.  Endo/Heme/Allergies: Does not bruise/bleed easily.  Psychiatric/Behavioral: Negative for depression, memory loss and substance abuse. The patient is not nervous/anxious.     Blood pressure (!) 157/67, pulse 73, temperature 98 F (36.7 C), temperature source Oral, resp. rate 19, SpO2 96 %. Physical Exam  WDWN in NAD Eyes:  Pupils equal, round; sclera anicteric HENT:  Oral mucosa moist; good dentition  Neck:  No masses palpated, no thyromegaly Lungs:  CTA bilaterally; normal respiratory effort CV:  Regular rate and rhythm; no murmurs; extremities well-perfused with no edema Abd:  +bowel sounds, distended, mild  generalized tenderness; no peritonitis Skin:  Warm, dry; no sign of jaundice Psychiatric - alert and oriented x 4; calm mood and affect  Assessment/Plan 1.  SBO vs. Ileus - ? Partial malrotation.  No clear transition point.  Admit for NG tube, SBO protocol IV hydration Continue Flomax    Maia Petties, MD 07/18/2018, 12:47 PM

## 2018-07-18 NOTE — ED Triage Notes (Signed)
Patient complains of abdominal distention and severe abdominal pain with nausea since early am. Patient moaning and grunting throughout assessment. Alert and oriented. No vomiting, no diarrhea

## 2018-07-19 LAB — CBC
HCT: 41.3 % (ref 39.0–52.0)
Hemoglobin: 13.2 g/dL (ref 13.0–17.0)
MCH: 29.7 pg (ref 26.0–34.0)
MCHC: 32 g/dL (ref 30.0–36.0)
MCV: 93 fL (ref 78.0–100.0)
Platelets: 117 10*3/uL — ABNORMAL LOW (ref 150–400)
RBC: 4.44 MIL/uL (ref 4.22–5.81)
RDW: 12.8 % (ref 11.5–15.5)
WBC: 7.9 10*3/uL (ref 4.0–10.5)

## 2018-07-19 LAB — BASIC METABOLIC PANEL
Anion gap: 4 — ABNORMAL LOW (ref 5–15)
BUN: 9 mg/dL (ref 8–23)
CALCIUM: 8 mg/dL — AB (ref 8.9–10.3)
CHLORIDE: 111 mmol/L (ref 98–111)
CO2: 26 mmol/L (ref 22–32)
CREATININE: 0.8 mg/dL (ref 0.61–1.24)
GFR calc non Af Amer: >60 mL/min (ref >60)
Glucose, Bld: 99 mg/dL (ref 70–99)
Potassium: 3.9 mmol/L (ref 3.5–5.1)
SODIUM: 141 mmol/L (ref 135–145)

## 2018-07-19 MED ORDER — BISACODYL 10 MG RE SUPP
10.0000 mg | Freq: Once | RECTAL | Status: AC
  Administered 2018-07-19: 10 mg via RECTAL
  Filled 2018-07-19: qty 1

## 2018-07-19 MED ORDER — KETOROLAC TROMETHAMINE 15 MG/ML IJ SOLN
15.0000 mg | Freq: Once | INTRAMUSCULAR | Status: AC
  Administered 2018-07-19: 15 mg via INTRAVENOUS
  Filled 2018-07-19: qty 1

## 2018-07-19 NOTE — Progress Notes (Signed)
[facility] Progress Note     Subjective: CC: nausea Patient had some nausea with NGT clamped overnight. Tried taking some ice chips too and feels like he is choking because of NGT. Is passing some flatus but feels like it has decreased some from yesterday afternoon to this AM. Has not gotten up much. Has a headache this AM.  Objective: Vital signs in last 24 hours: Temp:  [97.9 F (36.6 C)-98.5 F (36.9 C)] 97.9 F (36.6 C) (09/03 0440) Pulse Rate:  [62-82] 62 (09/03 0440) Resp:  [14-20] 19 (09/03 0440) BP: (131-168)/(58-78) 149/60 (09/03 0440) SpO2:  [93 %-100 %] 93 % (09/03 0440) Last BM Date: 07/16/18  Intake/Output from previous day: 09/02 0701 - 09/03 0700 In: 2257.1 [I.V.:1187.1; NG/GT:70; IV Piggyback:1000] Out: 3250 [Urine:400; Emesis/NG output:2350] Intake/Output this shift: No intake/output data recorded.  PE: Gen:  Alert, NAD, pleasant Card:  Regular rate and rhythm, pedal pulses 2+ BL Pulm:  Normal effort, clear to auscultation bilaterally Abd: Soft, non-tender, non-distended, bowel sounds hypoactive, no HSM Skin: warm and dry, no rashes  Psych: A&Ox3   Lab Results:  Recent Labs    07/18/18 0831 07/19/18 0424  WBC 9.8 7.9  HGB 16.0 13.2  HCT 49.9 41.3  PLT 142* 117*   BMET Recent Labs    07/18/18 0831 07/19/18 0424  NA 142 141  K 3.7 3.9  CL 105 111  CO2 26 26  GLUCOSE 122* 99  BUN 11 9  CREATININE 0.98 0.80  CALCIUM 9.1 8.0*   PT/INR No results for input(s): LABPROT, INR in the last 72 hours. CMP     Component Value Date/Time   NA 141 07/19/2018 0424   K 3.9 07/19/2018 0424   CL 111 07/19/2018 0424   CO2 26 07/19/2018 0424   GLUCOSE 99 07/19/2018 0424   BUN 9 07/19/2018 0424   CREATININE 0.80 07/19/2018 0424   CALCIUM 8.0 (L) 07/19/2018 0424   PROT 7.1 07/18/2018 0831   ALBUMIN 4.1 07/18/2018 0831   AST 16 07/18/2018 0831   ALT 15 07/18/2018 0831   ALKPHOS 69 07/18/2018 0831   BILITOT 1.5 (H) 07/18/2018 0831    GFRNONAA >60 07/19/2018 0424   GFRAA >60 07/19/2018 0424   Lipase     Component Value Date/Time   LIPASE 32 07/18/2018 0831       Studies/Results: Ct Abdomen Pelvis W Contrast  Result Date: 07/18/2018 CLINICAL DATA:  Pt c/o lower abdominal pain, nausea x 3 days; no prior abdominal surgeries. The patient has a history of bladder carcinoma and TURBT. Previous cholecystectomy, appendectomy. EXAM: CT ABDOMEN AND PELVIS WITH CONTRAST TECHNIQUE: Multidetector CT imaging of the abdomen and pelvis was performed using the standard protocol following bolus administration of intravenous contrast. CONTRAST:  15mL ISOVUE-300 IOPAMIDOL (ISOVUE-300) INJECTION 61%, <See Chart> ISOVUE-300 IOPAMIDOL (ISOVUE-300) INJECTION 61% COMPARISON:  CT of the abdomen on 05/31/2018 FINDINGS: Lower chest: There is bibasilar off subsegmental atelectasis. Heart size is UPPER normal. There are focal atherosclerotic calcifications of the coronary arteries. Hepatobiliary: Status post cholecystectomy. A small low-attenuation lesion is identified in the LEFT hepatic lobe and appears stable. No suspicious liver lesions. Pancreas: Unremarkable. No pancreatic ductal dilatation or surrounding inflammatory changes. Spleen: Normal in size without focal abnormality. Adrenals/Urinary Tract: Adrenal glands are normal in appearance. RIGHT kidney: Numerous cysts, largest 3.9 centimeters. There is no hydronephrosis. LEFT kidney: Multiple parapelvic cyst. No solid mass or hydronephrosis. The bladder and visualized portion of the urethra are normal. Stomach/Bowel: There is marked distension  of the stomach. There is partial malrotation of small bowel with jejunal loops descending in the RIGHT mid abdomen. The proximal small bowel loops are mildly dilated. There is gradual transition to normal caliber small bowel loops without mass or abrupt transition zone. Distal small bowel loops are normal in caliber. There is fluid within the ascending colon.  Moderate stool burden within the descending colon. No significant diverticulosis or acute diverticulitis. Vascular/Lymphatic: There is atherosclerotic calcification of the abdominal aorta, not associated with aneurysm. Reproductive: The prostate is enlarged. Prominent impression of the base of the bladder by the prostate gland. Other: Incidental note is made of an oval circumscribed calcification in the RIGHT hemipelvis, measuring 12 millimeters. Previously, this calcification was identified in the LOWER peritoneal cavity, just above the seminal vesicles. There is no associated fluid or stranding. Findings are benign. Musculoskeletal: Remote superior endplate fracture of L2. IMPRESSION: 1. Distended stomach and jejunum without discrete transition zone or mass. Findings favor early small bowel obstruction over focal ileus. 2. Partial malrotation of the small bowel, with dilated jejunal loops descending in the RIGHT mid abdomen. 3. Coronary artery calcifications. 4.  Aortic atherosclerosis.  (ICD10-I70.0) 5. Cholecystectomy. 6. Benign calcification within the RIGHT hemipelvis. 7. Enlarged prostate. Electronically Signed   By: Nolon Nations M.D.   On: 07/18/2018 10:32   Dg Abd Portable 1v-small Bowel Obstruction Protocol-initial, 8 Hr Delay  Result Date: 07/18/2018 CLINICAL DATA:  84 year old male with 8 hour delayed film for small-bowel follow-through. EXAM: PORTABLE ABDOMEN - 1 VIEW COMPARISON:  Earlier abdominal radiograph dated 07/18/2018 and CT dated 07/18/2018 FINDINGS: Partially visualized enteric tube in the left upper abdomen in the body of the stomach. Oral contrast has traversed into the colon. A minimally dilated short segment of small bowel in the right upper abdomen contains air. No other bowel dilatation identified. Excreted contrast noted in the bladder. The osseous structures and soft tissues are grossly unremarkable. IMPRESSION: Oral contrast within the colon. No definite evidence of bowel  obstruction. Electronically Signed   By: Anner Crete M.D.   On: 07/18/2018 23:41   Dg Abd Portable 1v-small Bowel Protocol-position Verification  Result Date: 07/18/2018 CLINICAL DATA:  Enteric tube placement. EXAM: PORTABLE ABDOMEN - 1 VIEW COMPARISON:  CT abdomen pelvis 07/18/2018 FINDINGS: ET tube tip and side-port project over the left upper quadrant. Gas is demonstrated within loops of bowel throughout the abdomen. Bibasilar heterogeneous opacities may represent atelectasis or scarring. IMPRESSION: Enteric tube tip and side-port project over the stomach. Electronically Signed   By: Lovey Newcomer M.D.   On: 07/18/2018 13:38    Anti-infectives: Anti-infectives (From admission, onward)   None       Assessment/Plan Hx of bladder cancer 2010 - s/p resection HTN HLD BPH Mitral valve prolapse Headache - tylenol prn and will give one dose toradol today   SBO - 8 hr film yesterday with contrast in colon - NGT with 2,350 cc bilious output - passing flatus - clamping trials to start today, pt may have ice chips and sips of clears with NGT clamped - mobilize!!!  FEN: ice chips/sips, clamp NGT; IVF VTE: SCDs, lovenox ID: no current abx indicated  LOS: 1 day    Brigid Re , [facility] 07/19/2018, 7:53 AM Pager: [phone] Consults: [phone] Mon-Fri 7:00 am-4:30 pm Sat-Sun 7:00 am-11:30 am

## 2018-07-20 LAB — BASIC METABOLIC PANEL
Anion gap: 10 (ref 5–15)
BUN: 8 mg/dL (ref 8–23)
CHLORIDE: 108 mmol/L (ref 98–111)
CO2: 23 mmol/L (ref 22–32)
Calcium: 8.9 mg/dL (ref 8.9–10.3)
Creatinine, Ser: 0.83 mg/dL (ref 0.61–1.24)
GFR calc non Af Amer: >60 mL/min (ref >60)
Glucose, Bld: 94 mg/dL (ref 70–99)
POTASSIUM: 4.2 mmol/L (ref 3.5–5.1)
SODIUM: 141 mmol/L (ref 135–145)

## 2018-07-20 NOTE — Progress Notes (Signed)
[facility] Progress Note     Subjective: CC: NGT irritating nose Patient has tolerated NGT clamping trials. Having bowel function. Denies abdominal pain or distention. Denies nausea. Stools have been loose and yellowish in color so far.   Objective: Vital signs in last 24 hours: Temp:  [97.8 F (36.6 C)-98.6 F (37 C)] 98 F (36.7 C) (09/03 2109) Pulse Rate:  [53-58] 55 (09/03 2109) Resp:  [16-18] 18 (09/03 2109) BP: (137-168)/(56-63) 158/58 (09/03 2109) SpO2:  [93 %-97 %] 95 % (09/03 2109) Weight:  [78.5 kg] 78.5 kg (09/03 1345) Last BM Date: 07/19/18  Intake/Output from previous day: 09/03 0701 - 09/04 0700 In: 2146.2 [P.O.:350; I.V.:1796.2] Out: 1350 [Urine:850; Emesis/NG output:500] Intake/Output this shift: No intake/output data recorded.  PE: Gen:  Alert, NAD, pleasant Card:  Regular rate and rhythm, pedal pulses 2+ BL Pulm:  Normal effort, clear to auscultation bilaterally Abd: Soft, non-tender, non-distended, bowel sounds normoactive, no HSM Skin: warm and dry, no rashes  Psych: A&Ox3    Lab Results:  Recent Labs    07/18/18 0831 07/19/18 0424  WBC 9.8 7.9  HGB 16.0 13.2  HCT 49.9 41.3  PLT 142* 117*   BMET Recent Labs    07/18/18 0831 07/19/18 0424  NA 142 141  K 3.7 3.9  CL 105 111  CO2 26 26  GLUCOSE 122* 99  BUN 11 9  CREATININE 0.98 0.80  CALCIUM 9.1 8.0*   PT/INR No results for input(s): LABPROT, INR in the last 72 hours. CMP     Component Value Date/Time   NA 141 07/19/2018 0424   K 3.9 07/19/2018 0424   CL 111 07/19/2018 0424   CO2 26 07/19/2018 0424   GLUCOSE 99 07/19/2018 0424   BUN 9 07/19/2018 0424   CREATININE 0.80 07/19/2018 0424   CALCIUM 8.0 (L) 07/19/2018 0424   PROT 7.1 07/18/2018 0831   ALBUMIN 4.1 07/18/2018 0831   AST 16 07/18/2018 0831   ALT 15 07/18/2018 0831   ALKPHOS 69 07/18/2018 0831   BILITOT 1.5 (H) 07/18/2018 0831   GFRNONAA >60 07/19/2018 0424   GFRAA >60 07/19/2018 0424   Lipase      Component Value Date/Time   LIPASE 32 07/18/2018 0831       Studies/Results: Ct Abdomen Pelvis W Contrast  Result Date: 07/18/2018 CLINICAL DATA:  Pt c/o lower abdominal pain, nausea x 3 days; no prior abdominal surgeries. The patient has a history of bladder carcinoma and TURBT. Previous cholecystectomy, appendectomy. EXAM: CT ABDOMEN AND PELVIS WITH CONTRAST TECHNIQUE: Multidetector CT imaging of the abdomen and pelvis was performed using the standard protocol following bolus administration of intravenous contrast. CONTRAST:  165mL ISOVUE-300 IOPAMIDOL (ISOVUE-300) INJECTION 61%, <See Chart> ISOVUE-300 IOPAMIDOL (ISOVUE-300) INJECTION 61% COMPARISON:  CT of the abdomen on 05/31/2018 FINDINGS: Lower chest: There is bibasilar off subsegmental atelectasis. Heart size is UPPER normal. There are focal atherosclerotic calcifications of the coronary arteries. Hepatobiliary: Status post cholecystectomy. A small low-attenuation lesion is identified in the LEFT hepatic lobe and appears stable. No suspicious liver lesions. Pancreas: Unremarkable. No pancreatic ductal dilatation or surrounding inflammatory changes. Spleen: Normal in size without focal abnormality. Adrenals/Urinary Tract: Adrenal glands are normal in appearance. RIGHT kidney: Numerous cysts, largest 3.9 centimeters. There is no hydronephrosis. LEFT kidney: Multiple parapelvic cyst. No solid mass or hydronephrosis. The bladder and visualized portion of the urethra are normal. Stomach/Bowel: There is marked distension of the stomach. There is partial malrotation of small bowel with jejunal loops descending  in the RIGHT mid abdomen. The proximal small bowel loops are mildly dilated. There is gradual transition to normal caliber small bowel loops without mass or abrupt transition zone. Distal small bowel loops are normal in caliber. There is fluid within the ascending colon. Moderate stool burden within the descending colon. No significant  diverticulosis or acute diverticulitis. Vascular/Lymphatic: There is atherosclerotic calcification of the abdominal aorta, not associated with aneurysm. Reproductive: The prostate is enlarged. Prominent impression of the base of the bladder by the prostate gland. Other: Incidental note is made of an oval circumscribed calcification in the RIGHT hemipelvis, measuring 12 millimeters. Previously, this calcification was identified in the LOWER peritoneal cavity, just above the seminal vesicles. There is no associated fluid or stranding. Findings are benign. Musculoskeletal: Remote superior endplate fracture of L2. IMPRESSION: 1. Distended stomach and jejunum without discrete transition zone or mass. Findings favor early small bowel obstruction over focal ileus. 2. Partial malrotation of the small bowel, with dilated jejunal loops descending in the RIGHT mid abdomen. 3. Coronary artery calcifications. 4.  Aortic atherosclerosis.  (ICD10-I70.0) 5. Cholecystectomy. 6. Benign calcification within the RIGHT hemipelvis. 7. Enlarged prostate. Electronically Signed   By: Nolon Nations M.D.   On: 07/18/2018 10:32   Dg Abd Portable 1v-small Bowel Obstruction Protocol-initial, 8 Hr Delay  Result Date: 07/18/2018 CLINICAL DATA:  84 year old male with 8 hour delayed film for small-bowel follow-through. EXAM: PORTABLE ABDOMEN - 1 VIEW COMPARISON:  Earlier abdominal radiograph dated 07/18/2018 and CT dated 07/18/2018 FINDINGS: Partially visualized enteric tube in the left upper abdomen in the body of the stomach. Oral contrast has traversed into the colon. A minimally dilated short segment of small bowel in the right upper abdomen contains air. No other bowel dilatation identified. Excreted contrast noted in the bladder. The osseous structures and soft tissues are grossly unremarkable. IMPRESSION: Oral contrast within the colon. No definite evidence of bowel obstruction. Electronically Signed   By: Anner Crete M.D.   On:  07/18/2018 23:41   Dg Abd Portable 1v-small Bowel Protocol-position Verification  Result Date: 07/18/2018 CLINICAL DATA:  Enteric tube placement. EXAM: PORTABLE ABDOMEN - 1 VIEW COMPARISON:  CT abdomen pelvis 07/18/2018 FINDINGS: ET tube tip and side-port project over the left upper quadrant. Gas is demonstrated within loops of bowel throughout the abdomen. Bibasilar heterogeneous opacities may represent atelectasis or scarring. IMPRESSION: Enteric tube tip and side-port project over the stomach. Electronically Signed   By: Lovey Newcomer M.D.   On: 07/18/2018 13:38    Anti-infectives: Anti-infectives (From admission, onward)   None       Assessment/Plan Hx of bladder cancer 2010 - s/p resection HTN HLD BPH Mitral valve prolapse Headache - resolved  SBO - 8 hr film 9/2 with contrast in colon - tolerated NGT clamping and having BMs - d/c'd NGT and start CLD - will advance to FLD this evening if tolerating well  - mobilize!!!  FEN: CLD; IVF VTE: SCDs, lovenox ID: no current abx indicated  LOS: 2 days    Brigid Re , [facility] 07/20/2018, 8:51 AM Pager: [phone] Consults: [phone] Mon-Fri 7:00 am-4:30 pm Sat-Sun 7:00 am-11:30 am

## 2018-07-20 NOTE — Care Management Note (Signed)
Case Management Note  Patient Details  Name: Jeremiah Zimmerman MRN: [id number] Date of Birth: Sep 16, 1934  Subjective/Objective:               ileus     Action/Plan:  Spoke w patient and daughter at bedside. Patient is from home w wife. He currently is getting outpatient PT at [place] by [facility]. They would prefer to continue with this as he is getting therapies that include using an exercise bike, other equiopment, and deep stretching that would not be as rigorous as HH PT.  He has a RW at home. No other DME needs at this time. Anticipate return to home at DC.  Expected Discharge Date:                  Expected Discharge Plan:  Home/Self Care  In-House Referral:     Discharge planning Services  CM Consult  Post Acute Care Choice:    Choice offered to:     DME Arranged:    DME Agency:     HH Arranged:    [place] Agency:     Status of Service:  Completed, signed off  If discussed at [employer] of Stay Meetings, dates discussed:    Additional Comments:  Carles Collet, RN 07/20/2018, 2:18 PM

## 2018-07-20 NOTE — Discharge Instructions (Signed)
Small Bowel Obstruction °A small bowel obstruction means that something is blocking the small bowel. The small bowel is also called the small intestine. It is the long tube that connects the stomach to the colon. An obstruction will stop food and fluids from passing through the small bowel. Treatment depends on what is causing the problem and how bad the problem is. °Follow these instructions at home: °· Get a lot of rest. °· Follow your diet as told by your doctor. You may need to: °? Only drink clear liquids until you start to get better. °? Avoid solid foods as told by your doctor. °· Take over-the-counter and prescription medicines only as told by your doctor. °· Keep all follow-up visits as told by your doctor. This is important. °Contact a doctor if: °· You have a fever. °· You have chills. °Get help right away if: °· You have pain or cramps that get worse. °· You throw up (vomit) blood. °· You have a feeling of being sick to your stomach (nausea) that does not go away. °· You cannot stop throwing up. °· You cannot drink fluids. °· You feel confused. °· You feel dry or thirsty (dehydrated). °· Your belly gets more bloated. °· You feel weak or you pass out (faint). °This information is not intended to replace advice given to you by your health care provider. Make sure you discuss any questions you have with your health care provider. °Document Released: 12/10/2004 Document Revised: 06/29/2016 Document Reviewed: 12/27/2014 °Elsevier Interactive Patient Education © 2018 [employer] Inc. ° °

## 2018-07-20 NOTE — Plan of Care (Signed)

## 2018-07-21 LAB — URINALYSIS, ROUTINE W REFLEX MICROSCOPIC
Bilirubin Urine: NEGATIVE
Glucose, UA: NEGATIVE mg/dL
Hgb urine dipstick: NEGATIVE
KETONES UR: NEGATIVE mg/dL
LEUKOCYTES UA: NEGATIVE
NITRITE: NEGATIVE
PH: 6.5 (ref 5.0–8.0)
Protein, ur: NEGATIVE mg/dL
Specific Gravity, Urine: 1.01 (ref 1.005–1.030)

## 2018-07-21 MED ORDER — POLYETHYLENE GLYCOL 3350 17 G PO PACK: 17.0000 g | PACK | Freq: Every day | ORAL | 0 refills | Status: DC | PRN

## 2018-07-21 MED ORDER — DOCUSATE SODIUM 100 MG PO CAPS: 100.0000 mg | ORAL_CAPSULE | Freq: Two times a day (BID) | ORAL | Status: AC | PRN

## 2018-07-21 NOTE — Discharge Summary (Signed)
[facility] Discharge Summary   Patient ID: Jeremiah Zimmerman MRN: [id number] DOB/AGE: 1934/06/01 84 y.o.  Admit date: 07/18/2018 Discharge date: 07/21/2018  Admitting Diagnosis: SBO  Discharge Diagnosis Patient Active Problem List   Diagnosis Date Noted  . Small bowel obstruction ([place]) 07/18/2018  . Right upper quadrant abdominal pain 01/25/2017  . Elevated LFTs 01/25/2017  . Acute medial meniscal tear 01/19/2013  . MUSCLE PAIN 10/15/2010  . OTHER MALAISE AND FATIGUE 10/15/2010  . DYSPNEA 10/15/2010  . BLADDER CANCER 10/14/2010  . HYPERLIPIDEMIA 10/14/2010  . Essential hypertension 10/14/2010  . BUNDLE BRANCH BLOCK, LEFT 10/14/2010  . GROSS HEMATURIA 10/14/2010    Consultants None   Imaging: No results found.  Procedures None  Hospital Course:  Patient is a 84 year old male who presented to [facility] with abdominal pain and distention.  Workup showed sbo.  Patient was admitted and underwent small bowel protocol. Patient improved with protocol and diet was advanced as tolerated. Patient reported some dysuria 07/21/18 and UA was negative for UTI. Patient may follow up with PCP if symptoms do not resolve.   On 07/21/18, the patient was voiding well, tolerating diet, ambulating well, pain well controlled, vital signs stable and felt stable for discharge home.  Patient will follow up with PCP as needed and knows to call with questions or concerns.    Physical Exam: Gen: Alert, NAD, pleasant Card: Regular rate and rhythm, pedal pulses 2+ BL Pulm: Normal effort, clear to auscultation bilaterally Abd: Soft, non-tender, non-distended, bowel soundsnormoactive, no HSM Skin: warm and dry, no rashes  Psych: A&Ox3    Allergies as of 07/21/2018      Reactions   Erythromycin Nausea Only   REACTION: Intolerance to this medication with upset stomach   Nitroglycerin Other (See Comments)   REACTION: Intolerance to this medication.  It drops his BP too low and quickly.   Sulfa  Antibiotics Nausea Only, Rash   Tetracyclines & Related Nausea Only   Amoxicillin Diarrhea, Nausea Only   Penicillins Swelling, Rash   Has patient had a PCN reaction causing immediate rash, facial/tongue/throat swelling, SOB or lightheadedness with hypotension: Yes Has patient had a PCN reaction causing severe rash involving mucus membranes or skin necrosis: No Has patient had a PCN reaction that required hospitalization: No Has patient had a PCN reaction occurring within the last 10 years: No If all of the above answers are "NO", then may proceed with Cephalosporin use. REACTION: Patient develops a rash and swelling at inject      Medication List    STOP taking these medications   oxyCODONE-acetaminophen 5-325 MG tablet Commonly known as:  PERCOCET/ROXICET     TAKE these medications   docusate sodium 100 MG capsule Commonly known as:  COLACE Take 1 capsule (100 mg total) by mouth 2 (two) times daily as needed for mild constipation.   Fish Oil 1000 MG Caps Take 1,000 mg by mouth daily.   polyethylene glycol packet Commonly known as:  MIRALAX / GLYCOLAX Take 17 g by mouth daily as needed for mild constipation.   tamsulosin 0.4 MG Caps capsule Commonly known as:  FLOMAX Take 0.4 mg by mouth daily.   vitamin B-12 500 MCG tablet Commonly known as:  CYANOCOBALAMIN Take 500 mcg by mouth daily.        Follow-up Information    Shon Baton, MD. Call.   Specialty:  Internal Medicine Why:  Call and schedule a follow up for post-hospital visit with PCP in 2-3  weeks Contact information: [place] [address] [phone]        Surgery, [place] Follow up.   Specialty:  General Surgery Why:  No follow up needed. Call with questions or concerns as needed.  Contact information: [address] [place] [address] [phone]           Signed: Brigid Re, [facility] Surgery 07/21/2018, 10:13 AM Pager:  [phone] Consults: [phone] Mon-Fri 7:00 am-4:30 pm Sat-Sun 7:00 am-11:30 am

## 2018-07-21 NOTE — Progress Notes (Signed)
Patient discharged to home with instructions. 

## 2018-07-21 NOTE — Care Management Important Message (Signed)
Important Message  Patient Details  Name: Jeremiah Zimmerman MRN: [id number] Date of Birth: Jan 18, 1934   Medicare Important Message Given:  Yes    Orbie Pyo 07/21/2018, 2:31 PM

## 2018-07-22 LAB — URINE CULTURE: Culture: NO GROWTH

## 2018-12-19 ENCOUNTER — Encounter: Payer: Self-pay | Admitting: Neurology

## 2019-01-06 ENCOUNTER — Ambulatory Visit: Payer: Medicare Other | Admitting: Neurology
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
## 2019-01-19 ENCOUNTER — Encounter: Payer: Self-pay | Admitting: Neurology

## 2019-01-19 ENCOUNTER — Ambulatory Visit (INDEPENDENT_AMBULATORY_CARE_PROVIDER_SITE_OTHER): Payer: Medicare Other | Admitting: Neurology

## 2019-01-19 VITALS — BP 136/64 | HR 84 | Ht 70.0 in | Wt 166.0 lb

## 2019-01-19 DIAGNOSIS — G20A1 Parkinson's disease without dyskinesia, without mention of fluctuations: Secondary | ICD-10-CM

## 2019-01-19 DIAGNOSIS — K5901 Slow transit constipation: Secondary | ICD-10-CM

## 2019-01-19 DIAGNOSIS — G4752 REM sleep behavior disorder: Secondary | ICD-10-CM

## 2019-01-19 DIAGNOSIS — G20 Parkinson's disease: Secondary | ICD-10-CM | POA: Diagnosis not present

## 2019-01-19 DIAGNOSIS — R1319 Other dysphagia: Secondary | ICD-10-CM

## 2019-01-19 MED ORDER — CARBIDOPA-LEVODOPA 25-100 MG PO TABS: 1.0000 | ORAL_TABLET | Freq: Three times a day (TID) | ORAL | 1 refills | Status: DC

## 2019-01-19 NOTE — Patient Instructions (Addendum)
Start Carbidopa Levodopa as follows:  Take 1/2 tablet three times daily, at least 30 minutes before meals, for one week  Then take 1/2 tablet in the morning, 1/2 tablet in the afternoon, 1 tablet in the evening, at least 30 minutes before meals, for one week  Then take 1/2 tablet in the morning, 1 tablet in the afternoon, 1 tablet in the evening, at least 30 minutes before meals, for one week  Then take 1 tablet three times daily, at least 30 minutes before meals  You have been referred to [facility] for therapy. They will call you directly to schedule an appointment.  Please call [phone] if you do not hear from them.    Constipation and Parkinson's disease:  1.Rancho recipe for constipation in Parkinsons Disease:  -1 cup of unprocessed bran (need to get this at [employer], [employer] or similar type of store), 2 cups of applesauce in 1 cup of prune juice 2.  Increase fiber intake (Metamucil,vegetables) 3.  Regular, moderate exercise can be beneficial. 4.  Avoid medications causing constipation, such as medications like antacids with calcium or magnesium 5.  Laxative overuse should be avoided. 6.  Stool softeners (Colace) can help with chronic constipation. 7.  Increase water intake.  You should be drinking 1/2 gallon of water a day as long as you have not been diagnosed with congestive heart failure or renal/kidney failure.  This is probably the single greatest thing that you can do to help your constipation.

## 2019-03-13 ENCOUNTER — Telehealth: Payer: Self-pay | Admitting: Neurology

## 2019-03-13 NOTE — Telephone Encounter (Signed)
Please call and get details.  What are the vision issues?  When did they come on?  Vision loss?

## 2019-03-13 NOTE — Telephone Encounter (Signed)
Pt daughter Jocelyn Lamer states that they are not able to do a E-Visit. They do not have a smart phone or computer. She states that the patient has checked his blood pressure several times over a couple of days.she state that the blood pressure is running in the normal range for him. Please advise

## 2019-03-13 NOTE — Telephone Encounter (Signed)
Would they be willing/able to do a phone visit with me in the next week or so?

## 2019-03-13 NOTE — Telephone Encounter (Signed)
Contacted Jocelyn Lamer, patients Daughter,  was in on March 5,2020 dx with parkinsons and prescribed Carbadopa/Levidopa 25/100 TID. patient has had symptoms the last 3 weeks dizziness, fatuige , notice changes in his vision- blurry. Had his eyes checked less than a year ago, no changes at that time.  Jocelyn Lamer would like to know if a dose reduction would help with these symptoms

## 2019-03-13 NOTE — Progress Notes (Signed)
Virtual Visit via Telephone Note The purpose of this virtual visit is to provide medical care while limiting exposure to the novel coronavirus.    Consent was obtained for phone visit:  Yes.   Answered questions that patient had about telehealth interaction:  Yes.   I discussed the limitations, risks, security and privacy concerns of performing an evaluation and management service by telephone. I also discussed with the patient that there may be a patient responsible charge related to this service. The patient expressed understanding and agreed to proceed.  Pt location: Home Physician Location: office Name of referring provider:  Shon Baton, MD I connected with .Jeremiah Zimmerman at patients initiation/request on 03/14/2019 at 11:30 AM EDT by telephone and verified that I am speaking with the correct person using two identifiers.  Pt MRN:  [id number] Pt DOB:  1934/07/09  Participants:  Jeremiah Zimmerman; daughter Jocelyn Lamer   History of Present Illness:  Patient was recently seen in our clinic on March 5.  He was diagnosed with Parkinson's disease.  We started him on carbidopa/levodopa 25/100 and over the course of a month we worked him up to 1 tablet 3 times per day.  His daughter called me yesterday and stated that the patient was having lightheadedness, fatigue and blurry vision.  They had checked his blood pressure and felt that it was normal.  There is been no loss of vision.  No falls.  He is having less hand tremor.  He has had some diplopia and if he closed eyes it would go away.  He can't tell me details.  He wonders if he could just take the carbidopa/levodopa 25/100 bid.  Daughter states that the lack of energy has really been the biggest deal.  No falls.   Observations/Objective:   Unable, phone  Assessment and Plan:   1.  Parkinson's disease, diagnosed January 19, 2019  -Patient felt lightheaded and much less energy on the immediate release tablet.  We will change him to  carbidopa/levodopa 25/100 CR, 1 tablet 3 times per day and see if he tolerates this better.  We discussed extensively the risks, benefits, and side effects of this.  He would like to change and see how he does.  He and his daughter had many questions about this and I answered them to the best of my ability.  2.  Constipation  -discussed nature and pathophysiology and association with PD  -discussed importance of hydration.  Pt is to increase water intake  -pt is given a copy of the rancho recipe  -recommended daily colace  -recommended miralax prn  3.  REM behavior disorder  -This is commonly associated with PD and the patient is experiencing this.  We discussed that this can be very serious and even harmful.  We talked about medications as well as physical barriers to put in the bed (particularly soft bed rails, pillow barriers).  We talked about moving the night stand so that it is not so close to the side of the bed.  Follow Up Instructions:  Patient has a follow-up appointment in July.  We will keep that for now.  They were somewhat nervous about coming to the appointment in July, even if restrictions are lifted.  We will address that at that point in time.  We can continue with the phone visits if necessary, although it would be nice to see him (they do not have video capability).  -I discussed the assessment and treatment plan with the  patient. The patient was provided an opportunity to ask questions and all were answered. The patient agreed with the plan and demonstrated an understanding of the instructions.   The patient was advised to call back or seek an in-person evaluation if the symptoms worsen or if the condition fails to improve as anticipated.    Total Time spent in visit with the patient was:  17 min, of which 100% of the time was spent in counseling and/or coordinating care on safety of med.   Pt understands and agrees with the plan of care outlined.     Alonza Bogus, DO

## 2019-03-13 NOTE — Telephone Encounter (Signed)
Patient is sch for 03-14-19 for telephone

## 2019-03-13 NOTE — Telephone Encounter (Signed)
Patient daughter called and states that her father is having problems with medication he is dizzy and fatigue and having vision problems.. she states she needs to talk to someone asap

## 2019-03-13 NOTE — Telephone Encounter (Signed)
Can we get him in within next week for a virtual visit to discuss?  In meantime, ask his daughter to check blood pressure and make sure ok

## 2019-03-14 ENCOUNTER — Telehealth (INDEPENDENT_AMBULATORY_CARE_PROVIDER_SITE_OTHER): Payer: Medicare Other | Admitting: Neurology

## 2019-03-14 ENCOUNTER — Encounter: Payer: Self-pay | Admitting: *Deleted

## 2019-03-14 ENCOUNTER — Encounter: Payer: Self-pay | Admitting: Neurology

## 2019-03-14 ENCOUNTER — Other Ambulatory Visit: Payer: Self-pay

## 2019-03-14 DIAGNOSIS — G20 Parkinson's disease: Secondary | ICD-10-CM

## 2019-03-14 MED ORDER — CARBIDOPA-LEVODOPA ER 25-100 MG PO TBCR: 1.0000 | EXTENDED_RELEASE_TABLET | Freq: Three times a day (TID) | ORAL | 1 refills | Status: DC

## 2019-04-17 ENCOUNTER — Inpatient Hospital Stay (HOSPITAL_COMMUNITY)
Admission: EM | Admit: 2019-04-17 | Discharge: 2019-04-19 | DRG: 872 | Disposition: A | Discharge status: Home / Self Care | Payer: Medicare Other | Attending: Internal Medicine | Admitting: Internal Medicine

## 2019-04-17 ENCOUNTER — Encounter (HOSPITAL_COMMUNITY): Payer: Self-pay

## 2019-04-17 ENCOUNTER — Emergency Department (HOSPITAL_COMMUNITY): Payer: Medicare Other

## 2019-04-17 DIAGNOSIS — A419 Sepsis, unspecified organism: Secondary | ICD-10-CM | POA: Diagnosis not present

## 2019-04-17 DIAGNOSIS — Z87440 Personal history of urinary (tract) infections: Secondary | ICD-10-CM

## 2019-04-17 DIAGNOSIS — A4150 Gram-negative sepsis, unspecified: Secondary | ICD-10-CM | POA: Diagnosis not present

## 2019-04-17 DIAGNOSIS — N390 Urinary tract infection, site not specified: Secondary | ICD-10-CM | POA: Diagnosis not present

## 2019-04-17 DIAGNOSIS — I447 Left bundle-branch block, unspecified: Secondary | ICD-10-CM | POA: Diagnosis present

## 2019-04-17 DIAGNOSIS — N400 Enlarged prostate without lower urinary tract symptoms: Secondary | ICD-10-CM | POA: Diagnosis present

## 2019-04-17 DIAGNOSIS — N3001 Acute cystitis with hematuria: Secondary | ICD-10-CM | POA: Diagnosis not present

## 2019-04-17 DIAGNOSIS — Z8049 Family history of malignant neoplasm of other genital organs: Secondary | ICD-10-CM

## 2019-04-17 DIAGNOSIS — Z79899 Other long term (current) drug therapy: Secondary | ICD-10-CM

## 2019-04-17 DIAGNOSIS — G20 Parkinson's disease: Secondary | ICD-10-CM | POA: Diagnosis present

## 2019-04-17 DIAGNOSIS — Z880 Allergy status to penicillin: Secondary | ICD-10-CM

## 2019-04-17 DIAGNOSIS — Z8551 Personal history of malignant neoplasm of bladder: Secondary | ICD-10-CM

## 2019-04-17 DIAGNOSIS — I341 Nonrheumatic mitral (valve) prolapse: Secondary | ICD-10-CM | POA: Diagnosis present

## 2019-04-17 DIAGNOSIS — I10 Essential (primary) hypertension: Secondary | ICD-10-CM | POA: Diagnosis present

## 2019-04-17 DIAGNOSIS — Z803 Family history of malignant neoplasm of breast: Secondary | ICD-10-CM

## 2019-04-17 DIAGNOSIS — Z8042 Family history of malignant neoplasm of prostate: Secondary | ICD-10-CM

## 2019-04-17 DIAGNOSIS — G20A1 Parkinson's disease without dyskinesia, without mention of fluctuations: Secondary | ICD-10-CM | POA: Diagnosis present

## 2019-04-17 DIAGNOSIS — Z820 Family history of epilepsy and other diseases of the nervous system: Secondary | ICD-10-CM

## 2019-04-17 DIAGNOSIS — Z882 Allergy status to sulfonamides status: Secondary | ICD-10-CM

## 2019-04-17 DIAGNOSIS — Z86010 Personal history of colonic polyps: Secondary | ICD-10-CM

## 2019-04-17 DIAGNOSIS — Z808 Family history of malignant neoplasm of other organs or systems: Secondary | ICD-10-CM

## 2019-04-17 DIAGNOSIS — Z1611 Resistance to penicillins: Secondary | ICD-10-CM | POA: Diagnosis present

## 2019-04-17 DIAGNOSIS — Z66 Do not resuscitate: Secondary | ICD-10-CM | POA: Diagnosis present

## 2019-04-17 DIAGNOSIS — B9689 Other specified bacterial agents as the cause of diseases classified elsewhere: Secondary | ICD-10-CM | POA: Diagnosis present

## 2019-04-17 DIAGNOSIS — E785 Hyperlipidemia, unspecified: Secondary | ICD-10-CM | POA: Diagnosis present

## 2019-04-17 DIAGNOSIS — B9620 Unspecified Escherichia coli [E. coli] as the cause of diseases classified elsewhere: Secondary | ICD-10-CM | POA: Diagnosis present

## 2019-04-17 DIAGNOSIS — Z889 Allergy status to unspecified drugs, medicaments and biological substances status: Secondary | ICD-10-CM

## 2019-04-17 DIAGNOSIS — Z1159 Encounter for screening for other viral diseases: Secondary | ICD-10-CM

## 2019-04-17 DIAGNOSIS — Z9049 Acquired absence of other specified parts of digestive tract: Secondary | ICD-10-CM

## 2019-04-17 DIAGNOSIS — Z800 Family history of malignant neoplasm of digestive organs: Secondary | ICD-10-CM

## 2019-04-17 DIAGNOSIS — Z888 Allergy status to other drugs, medicaments and biological substances status: Secondary | ICD-10-CM

## 2019-04-17 DIAGNOSIS — Z881 Allergy status to other antibiotic agents status: Secondary | ICD-10-CM

## 2019-04-17 DIAGNOSIS — K5909 Other constipation: Secondary | ICD-10-CM | POA: Diagnosis present

## 2019-04-17 HISTORY — DX: Parkinson's disease without dyskinesia, without mention of fluctuations: G20.A1

## 2019-04-17 HISTORY — DX: Parkinson's disease: G20

## 2019-04-17 LAB — COMPREHENSIVE METABOLIC PANEL
ALT: 11 U/L (ref 0–44)
AST: 16 U/L (ref 15–41)
Albumin: 3.6 g/dL (ref 3.5–5.0)
Alkaline Phosphatase: 66 U/L (ref 38–126)
Anion gap: 12 (ref 5–15)
BUN: 15 mg/dL (ref 8–23)
CO2: 20 mmol/L — ABNORMAL LOW (ref 22–32)
Calcium: 8.9 mg/dL (ref 8.9–10.3)
Chloride: 103 mmol/L (ref 98–111)
Creatinine, Ser: 1.06 mg/dL (ref 0.61–1.24)
GFR calc Af Amer: >60 mL/min (ref >60)
GFR calc non Af Amer: >60 mL/min (ref >60)
Glucose, Bld: 147 mg/dL — ABNORMAL HIGH (ref 70–99)
Potassium: 3.8 mmol/L (ref 3.5–5.1)
Sodium: 135 mmol/L (ref 135–145)
Total Bilirubin: 1.7 mg/dL — ABNORMAL HIGH (ref 0.3–1.2)
Total Protein: 6.8 g/dL (ref 6.5–8.1)

## 2019-04-17 LAB — URINALYSIS, ROUTINE W REFLEX MICROSCOPIC
Bilirubin Urine: NEGATIVE
Glucose, UA: NEGATIVE mg/dL
Ketones, ur: NEGATIVE mg/dL
Nitrite: NEGATIVE
Protein, ur: NEGATIVE mg/dL
Specific Gravity, Urine: 1.004 — ABNORMAL LOW (ref 1.005–1.030)
pH: 5 (ref 5.0–8.0)

## 2019-04-17 LAB — CBC WITH DIFFERENTIAL/PLATELET
Abs Immature Granulocytes: 0.09 10*3/uL — ABNORMAL HIGH (ref 0.00–0.07)
Basophils Absolute: 0 10*3/uL (ref 0.0–0.1)
Basophils Relative: 0 %
Eosinophils Absolute: 0.1 10*3/uL (ref 0.0–0.5)
Eosinophils Relative: 1 %
HCT: 43.8 % (ref 39.0–52.0)
Hemoglobin: 14.4 g/dL (ref 13.0–17.0)
Immature Granulocytes: 1 %
Lymphocytes Relative: 8 %
Lymphs Abs: 1.2 10*3/uL (ref 0.7–4.0)
MCH: 30.1 pg (ref 26.0–34.0)
MCHC: 32.9 g/dL (ref 30.0–36.0)
MCV: 91.6 fL (ref 80.0–100.0)
Monocytes Absolute: 0.8 10*3/uL (ref 0.1–1.0)
Monocytes Relative: 5 %
Neutro Abs: 12.6 10*3/uL — ABNORMAL HIGH (ref 1.7–7.7)
Neutrophils Relative %: 85 %
Platelets: 104 10*3/uL — ABNORMAL LOW (ref 150–400)
RBC: 4.78 MIL/uL (ref 4.22–5.81)
RDW: 12.4 % (ref 11.5–15.5)
WBC Morphology: INCREASED
WBC: 14.7 10*3/uL — ABNORMAL HIGH (ref 4.0–10.5)
nRBC: 0 % (ref 0.0–0.2)

## 2019-04-17 LAB — SARS CORONAVIRUS 2 BY RT PCR (HOSPITAL ORDER, PERFORMED IN ~~LOC~~ HOSPITAL LAB): SARS Coronavirus 2: NEGATIVE

## 2019-04-17 LAB — PROCALCITONIN: Procalcitonin: <0.1 ng/mL

## 2019-04-17 LAB — LACTIC ACID, PLASMA
Lactic Acid, Venous: 2.2 mmol/L (ref 0.5–1.9)
Lactic Acid, Venous: 1.9 mmol/L (ref 0.5–1.9)

## 2019-04-17 MED ORDER — LEVOFLOXACIN IN D5W 750 MG/150ML IV SOLN
750.0000 mg | INTRAVENOUS | Status: DC
  Administered 2019-04-17 – 2019-04-19 (×3): 750 mg via INTRAVENOUS
  Filled 2019-04-17 (×4): qty 150

## 2019-04-17 MED ORDER — SODIUM CHLORIDE 0.9 % IV BOLUS (SEPSIS)
250.0000 mL | Freq: Once | INTRAVENOUS | Status: AC
  Administered 2019-04-17: 250 mL via INTRAVENOUS

## 2019-04-17 MED ORDER — SODIUM CHLORIDE 0.9 % IV BOLUS (SEPSIS)
1000.0000 mL | Freq: Once | INTRAVENOUS | Status: AC
  Administered 2019-04-17: 1000 mL via INTRAVENOUS

## 2019-04-17 MED ORDER — ONDANSETRON HCL 4 MG PO TABS: 4.0000 mg | ORAL_TABLET | Freq: Four times a day (QID) | ORAL | Status: DC | PRN

## 2019-04-17 MED ORDER — ACETAMINOPHEN 650 MG RE SUPP: 650.0000 mg | Freq: Four times a day (QID) | RECTAL | Status: DC | PRN

## 2019-04-17 MED ORDER — SODIUM CHLORIDE 0.9% FLUSH
3.0000 mL | Freq: Two times a day (BID) | INTRAVENOUS | Status: DC
  Administered 2019-04-18: 3 mL via INTRAVENOUS

## 2019-04-17 MED ORDER — ONDANSETRON HCL 4 MG/2ML IJ SOLN
4.0000 mg | Freq: Once | INTRAMUSCULAR | Status: AC
  Administered 2019-04-17: 4 mg via INTRAVENOUS
  Filled 2019-04-17: qty 2

## 2019-04-17 MED ORDER — CARBIDOPA-LEVODOPA ER 25-100 MG PO TBCR
1.0000 | EXTENDED_RELEASE_TABLET | Freq: Three times a day (TID) | ORAL | Status: DC
  Administered 2019-04-17 – 2019-04-19 (×7): 1 via ORAL
  Filled 2019-04-17 (×9): qty 1

## 2019-04-17 MED ORDER — ONDANSETRON HCL 4 MG/2ML IJ SOLN
4.0000 mg | Freq: Four times a day (QID) | INTRAMUSCULAR | Status: DC | PRN
  Administered 2019-04-17 – 2019-04-18 (×2): 4 mg via INTRAVENOUS
  Filled 2019-04-17 (×2): qty 2

## 2019-04-17 MED ORDER — LACTATED RINGERS IV SOLN
INTRAVENOUS | Status: AC
  Administered 2019-04-17: 16:00:00 via INTRAVENOUS

## 2019-04-17 MED ORDER — ACETAMINOPHEN 325 MG PO TABS
650.0000 mg | ORAL_TABLET | Freq: Four times a day (QID) | ORAL | Status: DC | PRN
  Administered 2019-04-18 – 2019-04-19 (×4): 650 mg via ORAL
  Filled 2019-04-17 (×4): qty 2

## 2019-04-17 MED ORDER — POLYETHYLENE GLYCOL 3350 17 G PO PACK: 17.0000 g | PACK | Freq: Every day | ORAL | Status: DC | PRN

## 2019-04-17 MED ORDER — ENOXAPARIN SODIUM 40 MG/0.4ML ~~LOC~~ SOLN
40.0000 mg | SUBCUTANEOUS | Status: DC
  Administered 2019-04-17 – 2019-04-18 (×2): 40 mg via SUBCUTANEOUS
  Filled 2019-04-17 (×3): qty 0.4

## 2019-04-17 MED ORDER — DOCUSATE SODIUM 100 MG PO CAPS
100.0000 mg | ORAL_CAPSULE | Freq: Two times a day (BID) | ORAL | Status: DC
  Administered 2019-04-17 – 2019-04-19 (×5): 100 mg via ORAL
  Filled 2019-04-17 (×5): qty 1

## 2019-04-17 MED ORDER — SODIUM CHLORIDE 0.9% FLUSH: 3.0000 mL | Freq: Once | INTRAVENOUS | Status: DC

## 2019-04-17 MED ORDER — ZOLPIDEM TARTRATE 5 MG PO TABS
5.0000 mg | ORAL_TABLET | Freq: Every evening | ORAL | Status: DC | PRN
  Administered 2019-04-18 (×2): 5 mg via ORAL
  Filled 2019-04-17 (×2): qty 1

## 2019-04-17 MED ORDER — TAMSULOSIN HCL 0.4 MG PO CAPS
0.4000 mg | ORAL_CAPSULE | Freq: Every day | ORAL | Status: DC
  Administered 2019-04-17 – 2019-04-19 (×3): 0.4 mg via ORAL
  Filled 2019-04-17 (×3): qty 1

## 2019-04-17 NOTE — H&P (Signed)
History and Physical    Jeremiah Zimmerman EXN:[id number] DOB: [birth year] DOA: 04/17/2019  PCP: Shon Baton, MD Consultants:  Tat - neurology; Jeffie Pollock - urology Patient coming from:  Home - lives with wife; NOK: Son and daughters  Chief Complaint: Fever  HPI: Jeremiah Zimmerman is a 85 y.o. male with medical history significant of SBO; HTN; HLD; [name]; and bladder cancer (2010) presenting with fever.  He noted running a fever; his son called the PCP office and they suggested he come to the ER.  He was having bad tremors.  Symptoms started on Saturday and fever was as high as 104.  Severe tremors of hands, chest, abdomen - he has never experienced anything like it.  Mild nausea, has chronic constipation.  He had mild dysuria and frequency.  He has a routine urology appt scheduled for today.  He does occasionally get UTIs.   ED Course: [place]ry[place], per Dr. Alcario Drought:  85 yo M with sepsis, likely urinary source, UTI not back yet though. Fever onset last night, up to 104 at home, dysuria for past week. Blood in urine x1 month ago. Numerous allergies to ABx so got started on levaquin for presumed urinary source. CXR neg. First lactate elevated, now down trending.  Review of Systems: As per HPI; otherwise review of systems reviewed and negative.   Ambulatory Status:  Ambulates with a cane or walker  Past Medical History:  Diagnosis Date  . Adenomatous colon polyp   . Bladder cancer ([place]) 2010  . BPH (benign prostatic hyperplasia)   . Heart murmur   . Hyperlipidemia   . Hypertension   . MVP (mitral valve prolapse) 01-13-13   hx. LBBB previous EKG 11'11  in Epic.  Marland Kitchen Parkinson's disease ([place])   . SBO (small bowel obstruction) ([place]) 07/18/2018  . Seasonal allergies 01-13-13    Past Surgical History:  Procedure Laterality Date  . APPENDECTOMY    . BLADDER SURGERY  2010   bladder cancer in 2010/Post surgery bleed  . CARDIAC CATHETERIZATION  01-13-13   greater than 5  yrs ago-negative findings  . CHOLECYSTECTOMY N/A 01/27/2017   Procedure: LAPAROSCOPIC CHOLECYSTECTOMY WITH INTRAOPERATIVE CHOLANGIOGRAM;  Surgeon: Rolm Bookbinder, MD;  Location: [facility];  Service: General;  Laterality: N/A;  . COLONOSCOPY  01-13-13   hx.colon polyps excised-last 3-4 yrs ago  . ELBOW SURGERY     left  . KNEE ARTHROSCOPY WITH MEDIAL MENISECTOMY Right 01/20/2013   Procedure: KNEE ARTHROSCOPY WITH MEDIAL MENISECTOMY;  Surgeon: Gearlean Alf, MD;  Location: [facility];  Service: Orthopedics;  Laterality: Right;  right knee arthroscopy with medial meniscal debridement and chrondroplasty  . TONSILLECTOMY    . TONSILLECTOMY AND ADENOIDECTOMY      Social History   Socioeconomic History  . Marital status: Married    Spouse name: Not on file  . Number of children: Not on file  . Years of education: Not on file  . Highest education level: Not on file  Occupational History  . Not on file  Social Needs  . Financial resource strain: Not on file  . Food insecurity:    Worry: Not on file    Inability: Not on file  . Transportation needs:    Medical: Not on file    Non-medical: Not on file  Tobacco Use  . Smoking status: Never Smoker  . Smokeless tobacco: Never Used  Substance and Sexual Activity  . Alcohol use: No  . Drug use: No  . Sexual  activity: Not Currently  Lifestyle  . Physical activity:    Days per week: Not on file    Minutes per session: Not on file  . Stress: Not on file  Relationships  . Social connections:    Talks on phone: Not on file    Gets together: Not on file    Attends religious service: Not on file    Active member of club or organization: Not on file    Attends meetings of clubs or organizations: Not on file    Relationship status: Not on file  . Intimate partner violence:    Fear of current or ex partner: Not on file    Emotionally abused: Not on file    Physically abused: Not on file    Forced sexual activity: Not on file  Other Topics  Concern  . Not on file  Social History Narrative  . Not on file    Allergies  Allergen Reactions  . Erythromycin Nausea Only    REACTION: Intolerance to this medication with upset stomach  . Nitroglycerin Other (See Comments)    REACTION: Intolerance to this medication.  It drops his BP too low and quickly.  . Sulfa Antibiotics Nausea Only and Rash  . Tetracyclines & Related Nausea Only  . Amoxicillin Diarrhea and Nausea Only  . Penicillins Swelling and Rash    Has patient had a PCN reaction causing immediate rash, facial/tongue/throat swelling, SOB or lightheadedness with hypotension: Yes Has patient had a PCN reaction causing severe rash involving mucus membranes or skin necrosis: No Has patient had a PCN reaction that required hospitalization: No Has patient had a PCN reaction occurring within the last 10 years: No If all of the above answers are "NO", then may proceed with Cephalosporin use.   REACTION: Patient develops a rash and swelling at inject    Family History  Problem Relation Age of Onset  . Breast cancer Mother   . Prostate cancer Father   . Colon cancer Brother   . Cervical cancer Daughter   . Thyroid cancer Daughter   . Uterine cancer Daughter   . Prostate cancer Brother   . Alzheimer's disease Sister   . Alzheimer's disease Sister     Prior to Admission medications   Medication Sig Start Date End Date Taking? Authorizing Provider  Carbidopa-Levodopa ER (SINEMET CR) 25-100 MG tablet controlled release Take 1 tablet by mouth 3 (three) times daily. 03/14/19  Yes Tat, Eustace Quail, DO  docusate sodium (COLACE) 100 MG capsule Take 1 capsule (100 mg total) by mouth 2 (two) times daily as needed for mild constipation. 07/21/18 07/21/19 Yes Rayburn, Floyce Stakes, PA-C  Multiple Vitamin (MULTIVITAMIN WITH MINERALS) TABS tablet Take 1 tablet by mouth daily.   Yes [provider]  polyethylene glycol (MIRALAX) packet Take 17 g by mouth daily as needed for mild  constipation. 07/21/18  Yes Rayburn, Floyce Stakes, PA-C  Tamsulosin HCl (FLOMAX) 0.4 MG CAPS Take 0.4 mg by mouth daily.    Yes [provider]  vitamin B-12 (CYANOCOBALAMIN) 500 MCG tablet Take 500 mcg by mouth daily.   Yes [provider]    Physical Exam: Vitals:   04/17/19 1055 04/17/19 1159 04/17/19 1459 04/17/19 1625  BP: (!) 124/49 (!) 124/54 (!) 136/53 (!) 136/54  Pulse: 77 88 78 81  Resp: 16 17 16 17   Temp: 99.5 F (37.5 C) 98.3 F (36.8 C) 98.9 F (37.2 C) 98.9 F (37.2 C)  TempSrc: Oral Oral Oral  Oral  SpO2: 94% 96% 96% 99%  Weight:      Height:         . General:  Appears calm and comfortable and is NAD . Eyes:  PERRL, EOMI, normal lids, iris . ENT:  grossly normal hearing, lips & tongue, mmm . Neck:  no LAD, masses or thyromegaly . Cardiovascular:  RRR, no m/r/g. No LE edema.  Marland Kitchen Respiratory:   CTA bilaterally with no wheezes/rales/rhonchi.  Normal respiratory effort. . Abdomen:  soft, NT, ND, NABS . Skin:  no rash or induration seen on limited exam . Musculoskeletal:  grossly normal tone BUE/BLE, good ROM, no bony abnormality . Psychiatric:  grossly normal mood and affect, speech fluent and appropriate, AOx3 . Neurologic:  CN 2-12 grossly intact, moves all extremities in coordinated fashion, sensation intact    Radiological Exams on Admission: Dg Chest 2 View  Result Date: 04/17/2019 CLINICAL DATA:  85 year old male with fever. EXAM: CHEST - 2 VIEW COMPARISON:  Chest radiograph dated 05/26/2010 FINDINGS: Minimal left lung base densities, likely atelectatic changes. There is no focal consolidation, pleural effusion, or pneumothorax. The cardiac silhouette is within normal limits. Atherosclerotic calcification of the aortic arch. No acute osseous pathology. IMPRESSION: No active cardiopulmonary disease. Electronically Signed   By: Anner Crete M.D.   On: 04/17/2019 02:47    EKG: not done   Labs on Admission: I have personally reviewed the  available labs and imaging studies at the time of the admission.  Pertinent labs:   CO2 20 Glucose 147 Bili 1.7 Lactate 2.2 -> 1.9 Procalcitonin <0.10 WBC 14.7 Platelets 104 UA: small Hgb, large LE, few bacteria Blood and urine cultures pending   Assessment/Plan Principal Problem:   Sepsis due to gram-negative UTI (HCC) Active Problems:   Dyslipidemia   Essential hypertension   Parkinson's disease ([place])   Sepsis, likely due to UTI -SIRS criteria in this patient includes: Leukocytosis, fever, tachycardia  -Patient has evidence of acute organ failure with elevated lactate -While awaiting blood cultures, this appears to be a preseptic condition. -Sepsis protocol initiated -Suspected source is UTI based on symptoms and abnormal UA -Blood and urine cultures pending -Will place in observation status with telemetry and continue to monitor -Treat with IV Levaquin due to multiple drug allergies  Parkinson's -Continue Sinemet  HTN -He is no longer taking medications for this issue other than Flomax  HLD -He is not taking medications for this issue at this time     Note: This patient has been tested and is negative for the novel coronavirus COVID-19.  DVT prophylaxis:  Levaquin Code Status:  DNR - confirmed with patient Family Communication: None present; he did not request that I contact his family Disposition Plan:  Home once clinically improved Consults called: None  Admission status: It is my clinical opinion that referral for OBSERVATION is reasonable and necessary in this patient based on the above information provided. The aforementioned taken together are felt to place the patient at high risk for further clinical deterioration. However it is anticipated that the patient may be medically stable for discharge from the hospital within 24 to 48 hours.     Karmen Bongo MD [facility]   How to contact the [facility] Attending or Consulting provider [place] or  covering provider during after hours [place], for this patient?  1. Check the care team in [facility] and look for a) attending/consulting [facility] provider listed and b) the [facility] team listed 2. Log into [email or web address]  and use [place]'s universal password to access. If you do not have the password, please contact the hospital operator. 3. Locate the [facility] provider you are looking for under [facility] and page to a number that you can be directly reached. 4. If you still have difficulty reaching the provider, please page the [facility] (Director on Call) for the Hospitalists listed on amion for assistance.   04/17/2019, 6:39 PM

## 2019-04-17 NOTE — Progress Notes (Signed)
JARVIS SAWA [id number] Admission Data: 04/17/2019 5:03 PM Attending Provider: Karmen Bongo, MD  XTA:[id number], Jenny Reichmann, MD Consults/ Treatment Team:   RODOLPH HAGEMANN is a 85 y.o. male patient admitted from ED awake, alert  & orientated  X 3,  DNR, VSS - Blood pressure (!) 136/54, pulse 81, temperature 98.9 F (37.2 C), temperature source Oral, resp. rate 17, height 5\' 10"  (1.778 m), weight 74.6 kg, SpO2 99 %., O2    RA, no c/o shortness of breath, no c/o chest pain, no distress noted. Tele # 15 placed and pt is currently running:normal sinus rhythm with sinus arrhythmia.   IV site WDL:  forearm left, condition patent and no redness with a transparent dsg that's clean dry and intact.  Allergies:   Allergies  Allergen Reactions  . Erythromycin Nausea Only    REACTION: Intolerance to this medication with upset stomach  . Nitroglycerin Other (See Comments)    REACTION: Intolerance to this medication.  It drops his BP too low and quickly.  . Sulfa Antibiotics Nausea Only and Rash  . Tetracyclines & Related Nausea Only  . Amoxicillin Diarrhea and Nausea Only  . Penicillins Swelling and Rash    Has patient had a PCN reaction causing immediate rash, facial/tongue/throat swelling, SOB or lightheadedness with hypotension: Yes Has patient had a PCN reaction causing severe rash involving mucus membranes or skin necrosis: No Has patient had a PCN reaction that required hospitalization: No Has patient had a PCN reaction occurring within the last 10 years: No If all of the above answers are "NO", then may proceed with Cephalosporin use.   REACTION: Patient develops a rash and swelling at inject     Past Medical History:  Diagnosis Date  . Adenomatous colon polyp   . Bladder cancer ([place]) 2010  . BPH (benign prostatic hyperplasia)   . Heart murmur   . Hyperlipidemia   . Hypertension   . MVP (mitral valve prolapse) 01-13-13   hx. LBBB previous EKG 11'11  in Epic.  Marland Kitchen Parkinson's disease ([place])   .  SBO (small bowel obstruction) ([place]) 07/18/2018  . Seasonal allergies 01-13-13      Pt orientation to unit, room and routine. Information packet given to patient/family and safety video watched.  Admission INP armband ID verified with patient/family, and in place. SR up x 2, fall risk assessment complete with Patient and family verbalizing understanding of risks associated with falls. Pt verbalizes an understanding of how to use the call bell and to call for help before getting out of bed.  Skin, clean-dry- intact without evidence of bruising, or skin tears.   No evidence of skin break down noted on exam. no rashes, no ecchymoses, no petechiae, no nodules, no jaundice, no purpura, no wounds    Will cont to monitor and assist as needed.  Tresa Endo, RN 04/17/2019 5:03 PM

## 2019-04-17 NOTE — ED Notes (Signed)
Jeremiah Zimmerman [phone] pts son; update as needed

## 2019-04-17 NOTE — Progress Notes (Signed)
Pharmacy Antibiotic Note  Jeremiah Zimmerman is a 85 y.o. male admitted on 04/17/2019 with urosepsis.  Pharmacy has been consulted for levaquin dosing.  Plan: Levaquin 750 mg IV q24 hours F/u renal function, cultures and clinical course  Height: 5\' 10"  (177.8 cm) Weight: 160 lb (72.6 kg) IBW/kg (Calculated) : 73  Temp (24hrs), Avg:102.8 F (39.3 C), Min:102.8 F (39.3 C), Max:102.8 F (39.3 C)  Recent Labs  Lab 04/17/19 0211  WBC 14.7*  CREATININE 1.06  LATICACIDVEN 2.2*    Estimated Creatinine Clearance: 54.2 mL/min (by C-G formula based on SCr of 1.06 mg/dL).    Allergies  Allergen Reactions  . Erythromycin Nausea Only    REACTION: Intolerance to this medication with upset stomach  . Nitroglycerin Other (See Comments)    REACTION: Intolerance to this medication.  It drops his BP too low and quickly.  . Sulfa Antibiotics Nausea Only and Rash  . Tetracyclines & Related Nausea Only  . Amoxicillin Diarrhea and Nausea Only  . Penicillins Swelling and Rash    Has patient had a PCN reaction causing immediate rash, facial/tongue/throat swelling, SOB or lightheadedness with hypotension: Yes Has patient had a PCN reaction causing severe rash involving mucus membranes or skin necrosis: No Has patient had a PCN reaction that required hospitalization: No Has patient had a PCN reaction occurring within the last 10 years: No If all of the above answers are "NO", then may proceed with Cephalosporin use.   REACTION: Patient develops a rash and swelling at inject     Thank you for allowing pharmacy to be a part of this patient's care.  Excell Seltzer Poteet 04/17/2019 4:56 AM

## 2019-04-17 NOTE — ED Triage Notes (Signed)
Pt from home w/ a c/o a fever that began tonight. His home temp was 104. He took Two Tylenol tablets of an unknown dosage PTA. Temp is now 102.8. Additional complaints of nausea w/o vomiting. Pt denies cough, SOB, and diarrhea.   Pt has Parkinson's and states that he has been shaking hard today in his abdomen and chest.

## 2019-04-17 NOTE — Progress Notes (Signed)
Pt requesting sleep aid. Pt also c/o 6/10 burning pain with urination. On-call NP paged and made aware. Will continue to monitor and treat per orders.

## 2019-04-17 NOTE — ED Provider Notes (Signed)
Patient seen/examined in the Emergency Department in conjunction with Advanced Practice Provider McDonald Patient presents from home with fever, he reports feeling weak Exam : awake/alert, no distress, no meningeal signs, lungs CTA-B, no abd tenderness Plan: will need to be admitted for fever/mild sepsis     Ripley Fraise, MD 04/17/19 [phone]

## 2019-04-17 NOTE — ED Provider Notes (Signed)
[facility] EMERGENCY DEPARTMENT Provider Note   CSN: [id number] Arrival date & time: 04/17/19  0143    History   Chief Complaint Chief Complaint  Patient presents with  . Fever    HPI Jeremiah Zimmerman is a 85 y.o. male with a history of Parkinson's, bladder cancer, mitral valve prolapse, SBO, HLD, HTN who presents to the emergency department with a chief complaint of fever.  The patient reports that he first noticed a fever earlier tonight after noticing a worsening tremor in his bilateral hands.  He notes that the tremor then extended to his chest and abdomen.  He does not think the worsening tremor was chills or Reiger's.    He was initially febrile to 102, but then increased to 103, and later 104.  He took 1000 mg of Tylenol at approximately 1 AM.  He reports that over the last week he has been having some dysuria and nausea.   He reports that he was seen by urology 1 month ago after he developed hematuria.  The bladder was unable to be assessed at that time and he was scheduled a follow-up appointment in 1 month.  He reports that his appointment is scheduled for later today.  He denies back pain, abdominal pain, chills, urinary frequency or hesitancy, penile or testicular pain, shortness of breath, chest pain, cough, neck pain or stiffness, or headache.     The history is provided by the patient. No language interpreter was used.    Past Medical History:  Diagnosis Date  . Adenomatous colon polyp   . Bladder cancer ([facility]) 2010  . BPH (benign prostatic hyperplasia)   . Heart murmur   . Hyperlipidemia   . Hypertension   . MVP (mitral valve prolapse) 01-13-13   hx. LBBB previous EKG 11'11  in Epic.  Marland Kitchen SBO (small bowel obstruction) ([place]) 07/18/2018  . Seasonal allergies 01-13-13    Patient Active Problem List   Diagnosis Date Noted  . Small bowel obstruction ([place]) 07/18/2018  . Right upper quadrant abdominal pain 01/25/2017  . Elevated LFTs 01/25/2017  .  Acute medial meniscal tear 01/19/2013  . MUSCLE PAIN 10/15/2010  . OTHER MALAISE AND FATIGUE 10/15/2010  . DYSPNEA 10/15/2010  . BLADDER CANCER 10/14/2010  . HYPERLIPIDEMIA 10/14/2010  . Essential hypertension 10/14/2010  . BUNDLE BRANCH BLOCK, LEFT 10/14/2010  . GROSS HEMATURIA 10/14/2010    Past Surgical History:  Procedure Laterality Date  . APPENDECTOMY    . BLADDER SURGERY  2010   bladder cancer in 2010/Post surgery bleed  . CARDIAC CATHETERIZATION  01-13-13   greater than 5 yrs ago-negative findings  . CHOLECYSTECTOMY N/A 01/27/2017   Procedure: LAPAROSCOPIC CHOLECYSTECTOMY WITH INTRAOPERATIVE CHOLANGIOGRAM;  Surgeon: Rolm Bookbinder, MD;  Location: [facility];  Service: General;  Laterality: N/A;  . COLONOSCOPY  01-13-13   hx.colon polyps excised-last 3-4 yrs ago  . ELBOW SURGERY     left  . KNEE ARTHROSCOPY WITH MEDIAL MENISECTOMY Right 01/20/2013   Procedure: KNEE ARTHROSCOPY WITH MEDIAL MENISECTOMY;  Surgeon: Gearlean Alf, MD;  Location: [facility];  Service: Orthopedics;  Laterality: Right;  right knee arthroscopy with medial meniscal debridement and chrondroplasty  . TONSILLECTOMY    . TONSILLECTOMY AND ADENOIDECTOMY          Home Medications    Prior to Admission medications   Medication Sig Start Date End Date Taking? Authorizing Provider  Carbidopa-Levodopa ER (SINEMET CR) 25-100 MG tablet controlled release Take 1 tablet by mouth 3 (  three) times daily. 03/14/19  Yes Tat, Eustace Quail, DO  docusate sodium (COLACE) 100 MG capsule Take 1 capsule (100 mg total) by mouth 2 (two) times daily as needed for mild constipation. 07/21/18 07/21/19 Yes Rayburn, Floyce Stakes, PA-C  Multiple Vitamin (MULTIVITAMIN WITH MINERALS) TABS tablet Take 1 tablet by mouth daily.   Yes [provider]  polyethylene glycol (MIRALAX) packet Take 17 g by mouth daily as needed for mild constipation. 07/21/18  Yes Rayburn, Floyce Stakes, PA-C  Tamsulosin HCl (FLOMAX) 0.4 MG CAPS Take 0.4 mg by mouth daily.     Yes [provider]  vitamin B-12 (CYANOCOBALAMIN) 500 MCG tablet Take 500 mcg by mouth daily.   Yes [provider]    Family History Family History  Problem Relation Age of Onset  . Breast cancer Mother   . Prostate cancer Father   . Colon cancer Brother   . Cervical cancer Daughter   . Thyroid cancer Daughter   . Uterine cancer Daughter   . Prostate cancer Brother   . Alzheimer's disease Sister   . Alzheimer's disease Sister     Social History Social History   Tobacco Use  . Smoking status: Never Smoker  . Smokeless tobacco: Never Used  Substance Use Topics  . Alcohol use: No  . Drug use: No     Allergies   Erythromycin; Nitroglycerin; Sulfa antibiotics; Tetracyclines & related; Amoxicillin; and Penicillins   Review of Systems Review of Systems  Constitutional: Positive for fever. Negative for appetite change.  HENT: Negative for congestion and sore throat.   Eyes: Negative for visual disturbance.  Respiratory: Negative for shortness of breath.   Cardiovascular: Negative for chest pain.  Gastrointestinal: Positive for nausea. Negative for abdominal pain, blood in stool, diarrhea and vomiting.  Genitourinary: Positive for dysuria. Negative for discharge, flank pain, frequency, penile pain, testicular pain and urgency.  Musculoskeletal: Negative for back pain, myalgias and neck pain.  Skin: Negative for rash.  Allergic/Immunologic: Negative for immunocompromised state.  Neurological: Negative for dizziness, seizures, syncope, weakness and headaches.  Psychiatric/Behavioral: Negative for confusion.     Physical Exam Updated Vital Signs BP 102/72   Pulse 75   Temp 98.6 F (37 C)   Resp 18   Ht 5\' 10"  (1.778 m)   Wt 72.6 kg   SpO2 97%   BMI 22.96 kg/m   Physical Exam Vitals signs and nursing note reviewed.  Constitutional:      General: He is not in acute distress.    Appearance: He is well-developed. He is not diaphoretic.  HENT:      Head: Normocephalic.  Eyes:     Conjunctiva/sclera: Conjunctivae normal.  Neck:     Musculoskeletal: Neck supple.  Cardiovascular:     Rate and Rhythm: Regular rhythm. Tachycardia present.     Pulses: Normal pulses.     Heart sounds: No murmur. No friction rub. No gallop.   Pulmonary:     Effort: Pulmonary effort is normal. No respiratory distress.     Breath sounds: No stridor. No wheezing, rhonchi or rales.  Chest:     Chest wall: No tenderness.  Abdominal:     General: There is no distension.     Palpations: Abdomen is soft. There is no mass.     Tenderness: There is no abdominal tenderness. There is right CVA tenderness. There is no left CVA tenderness, guarding or rebound.     Hernia: No hernia is present.     Comments:  Abdomen is soft, nontender, nondistended.  Tender to palpation over the right CVA.  No left CVA tenderness.  Normoactive bowel sounds.  No tenderness over McBurney's point.  Negative Murphy sign.  Musculoskeletal:     Right lower leg: No edema.     Left lower leg: No edema.  Skin:    General: Skin is warm and dry.  Neurological:     Mental Status: He is alert.  Psychiatric:        Behavior: Behavior normal.      ED Treatments / Results  Labs (all labs ordered are listed, but only abnormal results are displayed) Labs Reviewed  LACTIC ACID, PLASMA - Abnormal; Notable for the following components:      Result Value   Lactic Acid, Venous 2.2 (*)    All other components within normal limits  COMPREHENSIVE METABOLIC PANEL - Abnormal; Notable for the following components:   CO2 20 (*)    Glucose, Bld 147 (*)    Total Bilirubin 1.7 (*)    All other components within normal limits  CBC WITH DIFFERENTIAL/PLATELET - Abnormal; Notable for the following components:   WBC 14.7 (*)    Platelets 104 (*)    Neutro Abs 12.6 (*)    Abs Immature Granulocytes 0.09 (*)    All other components within normal limits  URINALYSIS, ROUTINE W REFLEX MICROSCOPIC -  Abnormal; Notable for the following components:   APPearance HAZY (*)    Specific Gravity, Urine 1.004 (*)    Hgb urine dipstick SMALL (*)    Leukocytes,Ua LARGE (*)    Bacteria, UA FEW (*)    All other components within normal limits  SARS CORONAVIRUS 2 (HOSPITAL ORDER, [facility])  CULTURE, BLOOD (ROUTINE X 2)  CULTURE, BLOOD (ROUTINE X 2)  URINE CULTURE  LACTIC ACID, PLASMA    EKG None  Radiology Dg Chest 2 View  Result Date: 04/17/2019 CLINICAL DATA:  85 year old male with fever. EXAM: CHEST - 2 VIEW COMPARISON:  Chest radiograph dated 05/26/2010 FINDINGS: Minimal left lung base densities, likely atelectatic changes. There is no focal consolidation, pleural effusion, or pneumothorax. The cardiac silhouette is within normal limits. Atherosclerotic calcification of the aortic arch. No acute osseous pathology. IMPRESSION: No active cardiopulmonary disease. Electronically Signed   By: Anner Crete M.D.   On: 04/17/2019 02:47    Procedures .Critical Care Performed by: Joanne Gavel, PA-C Authorized by: Joanne Gavel, PA-C   Critical care provider statement:    Critical care time (minutes):  40   Critical care time was exclusive of:  Separately billable procedures and treating other patients and teaching time   Critical care was necessary to treat or prevent imminent or life-threatening deterioration of the following conditions:  Sepsis   Critical care was time spent personally by me on the following activities:  Ordering and performing treatments and interventions, ordering and review of laboratory studies, ordering and review of radiographic studies, pulse oximetry, re-evaluation of patient's condition, review of old charts, evaluation of patient's response to treatment, development of treatment plan with patient or surrogate, obtaining history from patient or surrogate and examination of patient   (including critical care time)  Medications  Ordered in ED Medications  sodium chloride flush (NS) 0.9 % injection 3 mL (has no administration in time range)  sodium chloride 0.9 % bolus 1,000 mL (0 mLs Intravenous Stopped 04/17/19 0714)    And  sodium chloride 0.9 % bolus 1,000 mL (1,000 mLs  Intravenous New Bag/Given 04/17/19 0716)    And  sodium chloride 0.9 % bolus 250 mL (has no administration in time range)  levofloxacin (LEVAQUIN) IVPB 750 mg (0 mg Intravenous Stopped 04/17/19 0719)  ondansetron (ZOFRAN) injection 4 mg (4 mg Intravenous Given 04/17/19 0603)     Initial Impression / Assessment and Plan / ED Course  I have reviewed the triage vital signs and the nursing notes.  Pertinent labs & imaging results that were available during my care of the patient were reviewed by me and considered in my medical decision making (see chart for details).        85 year old male with a history of Parkinson's, bladder cancer, mitral valve prolapse, SBO, HLD, and HTN presents to the emergency department with a chief complaint of fever, onset tonight, and dysuria for the last week.  Patient is febrile to 102.8 on arrival and mildly tachycardic.  Normotensive and he has no tachypnea or hypoxia.  He has a leukocytosis of 14.7 with a left shift.  Initial lactate is elevated at 2.2, but improving to 1.9 on repeat.   Chest x-ray is unremarkable.  COVID-19 test is negative.  Sepsis order set has been placed.  Blood cultures x2 have been collected.  Urine with small hemoglobinuria, WBC clumps, and leukocyte esterase.  Urine culture has been sent.  Spoke with pharmacy regarding the patient's numerous allergies.  He does not have a previous urine culture with sensitivities and susceptibility in his medical record.  Will initiate Levaquin.   Consult to the hospitalist team and spoke with Dr. Alcario Drought.  He reports triad will accept the patient for admission. The patient appears reasonably stabilized for admission considering the current resources, flow, and  capabilities available in the ED at this time, and I doubt any other [facility] requiring further screening and/or treatment in the ED prior to admission.   Final Clinical Impressions(s) / ED Diagnoses   Final diagnoses:  Sepsis without acute organ dysfunction, due to unspecified organism [facility])  Acute cystitis with hematuria    ED Discharge Orders    None       Joanne Gavel, PA-C 04/17/19 0754    Ripley Fraise, MD 04/18/19 [phone]

## 2019-04-18 DIAGNOSIS — B9689 Other specified bacterial agents as the cause of diseases classified elsewhere: Secondary | ICD-10-CM | POA: Diagnosis present

## 2019-04-18 DIAGNOSIS — I341 Nonrheumatic mitral (valve) prolapse: Secondary | ICD-10-CM | POA: Diagnosis present

## 2019-04-18 DIAGNOSIS — C679 Malignant neoplasm of bladder, unspecified: Secondary | ICD-10-CM | POA: Diagnosis not present

## 2019-04-18 DIAGNOSIS — A419 Sepsis, unspecified organism: Secondary | ICD-10-CM | POA: Diagnosis present

## 2019-04-18 DIAGNOSIS — Z800 Family history of malignant neoplasm of digestive organs: Secondary | ICD-10-CM | POA: Diagnosis not present

## 2019-04-18 DIAGNOSIS — G20 Parkinson's disease: Secondary | ICD-10-CM | POA: Diagnosis not present

## 2019-04-18 DIAGNOSIS — Z9049 Acquired absence of other specified parts of digestive tract: Secondary | ICD-10-CM | POA: Diagnosis not present

## 2019-04-18 DIAGNOSIS — Z8049 Family history of malignant neoplasm of other genital organs: Secondary | ICD-10-CM | POA: Diagnosis not present

## 2019-04-18 DIAGNOSIS — K5909 Other constipation: Secondary | ICD-10-CM | POA: Diagnosis present

## 2019-04-18 DIAGNOSIS — Z889 Allergy status to unspecified drugs, medicaments and biological substances status: Secondary | ICD-10-CM | POA: Diagnosis not present

## 2019-04-18 DIAGNOSIS — A4150 Gram-negative sepsis, unspecified: Secondary | ICD-10-CM | POA: Diagnosis not present

## 2019-04-18 DIAGNOSIS — Z8551 Personal history of malignant neoplasm of bladder: Secondary | ICD-10-CM | POA: Diagnosis not present

## 2019-04-18 DIAGNOSIS — Z8042 Family history of malignant neoplasm of prostate: Secondary | ICD-10-CM | POA: Diagnosis not present

## 2019-04-18 DIAGNOSIS — Z66 Do not resuscitate: Secondary | ICD-10-CM | POA: Diagnosis present

## 2019-04-18 DIAGNOSIS — Z881 Allergy status to other antibiotic agents status: Secondary | ICD-10-CM | POA: Diagnosis not present

## 2019-04-18 DIAGNOSIS — B9620 Unspecified Escherichia coli [E. coli] as the cause of diseases classified elsewhere: Secondary | ICD-10-CM | POA: Diagnosis present

## 2019-04-18 DIAGNOSIS — N390 Urinary tract infection, site not specified: Secondary | ICD-10-CM | POA: Diagnosis not present

## 2019-04-18 DIAGNOSIS — I10 Essential (primary) hypertension: Secondary | ICD-10-CM | POA: Diagnosis not present

## 2019-04-18 DIAGNOSIS — N3001 Acute cystitis with hematuria: Secondary | ICD-10-CM | POA: Diagnosis not present

## 2019-04-18 DIAGNOSIS — I447 Left bundle-branch block, unspecified: Secondary | ICD-10-CM | POA: Diagnosis present

## 2019-04-18 DIAGNOSIS — N400 Enlarged prostate without lower urinary tract symptoms: Secondary | ICD-10-CM | POA: Diagnosis present

## 2019-04-18 DIAGNOSIS — E785 Hyperlipidemia, unspecified: Secondary | ICD-10-CM | POA: Diagnosis not present

## 2019-04-18 DIAGNOSIS — Z880 Allergy status to penicillin: Secondary | ICD-10-CM | POA: Diagnosis not present

## 2019-04-18 DIAGNOSIS — Z1611 Resistance to penicillins: Secondary | ICD-10-CM | POA: Diagnosis present

## 2019-04-18 DIAGNOSIS — Z1159 Encounter for screening for other viral diseases: Secondary | ICD-10-CM | POA: Diagnosis not present

## 2019-04-18 DIAGNOSIS — Z87440 Personal history of urinary (tract) infections: Secondary | ICD-10-CM | POA: Diagnosis not present

## 2019-04-18 DIAGNOSIS — Z803 Family history of malignant neoplasm of breast: Secondary | ICD-10-CM | POA: Diagnosis not present

## 2019-04-18 LAB — CBC
HCT: 37.2 % — ABNORMAL LOW (ref 39.0–52.0)
Hemoglobin: 12.2 g/dL — ABNORMAL LOW (ref 13.0–17.0)
MCH: 30.3 pg (ref 26.0–34.0)
MCHC: 32.8 g/dL (ref 30.0–36.0)
MCV: 92.3 fL (ref 80.0–100.0)
Platelets: 75 10*3/uL — ABNORMAL LOW (ref 150–400)
RBC: 4.03 MIL/uL — ABNORMAL LOW (ref 4.22–5.81)
RDW: 13 % (ref 11.5–15.5)
WBC: 15.2 10*3/uL — ABNORMAL HIGH (ref 4.0–10.5)
nRBC: 0 % (ref 0.0–0.2)

## 2019-04-18 LAB — BASIC METABOLIC PANEL
Anion gap: 7 (ref 5–15)
BUN: 9 mg/dL (ref 8–23)
CO2: 23 mmol/L (ref 22–32)
Calcium: 8.3 mg/dL — ABNORMAL LOW (ref 8.9–10.3)
Chloride: 106 mmol/L (ref 98–111)
Creatinine, Ser: 0.97 mg/dL (ref 0.61–1.24)
GFR calc Af Amer: >60 mL/min (ref >60)
GFR calc non Af Amer: >60 mL/min (ref >60)
Glucose, Bld: 117 mg/dL — ABNORMAL HIGH (ref 70–99)
Potassium: 3.9 mmol/L (ref 3.5–5.1)
Sodium: 136 mmol/L (ref 135–145)

## 2019-04-18 LAB — URINE CULTURE: Culture: >=100000 — AB

## 2019-04-18 NOTE — Evaluation (Addendum)
Physical Therapy Evaluation Patient Details Name: Jeremiah Zimmerman MRN: [id number] DOB: 07-29-1934 Today's Date: 04/18/2019   History of Present Illness     Clinical Impression  Patient reports feeling tired and weak but appears to be close to his baseline mobility. He was bale to ambulate safely with a walker and transferred without a device. He has no need for skilled therapy at this time. He was independent with bed mobility as well.     Follow Up Recommendations      Equipment Recommendations       Recommendations for Other Services       Precautions / Restrictions        Mobility  Bed Mobility                  Transfers                    Ambulation/Gait                Stairs            Wheelchair Mobility    Modified Rankin (Stroke Patients Only)       Balance                                             Pertinent Vitals/Pain      Home Living                        Prior Function                 Hand Dominance        Extremity/Trunk Assessment                Communication      Cognition                                              General Comments      Exercises     Assessment/Plan    PT Assessment    PT Problem List         PT Treatment Interventions      PT Goals (Current goals can be found in the Care Plan section)       Frequency     Barriers to discharge        Co-evaluation               AM-PAC PT "6 Clicks" Mobility  Outcome Measure                  End of Session              Time:  -      Charges:                 Carney Living PT DPT  04/18/2019, 4:20 PM

## 2019-04-18 NOTE — Progress Notes (Signed)
PROGRESS NOTE    Jeremiah Zimmerman  EUM:[id number] DOB: [birth year] DOA: 04/17/2019 PCP: Shon Baton, MD    Brief Narrative:   Jeremiah Zimmerman is a 85 y.o. male with medical history significant of SBO; HTN; HLD; [name]; and bladder cancer (2010) presenting with fever.  He noted running a fever; his son called the PCP office and they suggested he come to the ER.  He was having bad tremors.  Symptoms started on Saturday and fever was as high as 104.  Severe tremors of hands, chest, abdomen - he has never experienced anything like it.  Mild nausea, has chronic constipation.  He had mild dysuria and frequency.  He has a routine urology appt scheduled for today.  He does occasionally get UTIs.  Fever onset last night, up to 104 at home, dysuria for past week. Blood in urine x1 month ago. Numerous allergies to ABx so got started on levaquin for presumed urinary source. CXR neg. First lactate elevated, now down trending.  Assessment & Plan:   Principal Problem:   Sepsis due to gram-negative UTI [facility]) Active Problems:   Dyslipidemia   Essential hypertension   Parkinson's disease ([place])   Sepsis, present on admission E Coli urinary tract infection Patient presenting with fevers, elevated white blood cell count of 14.7, tachycardia, and elevated lactic acid.  History of previous UTIs.  Urinalysis with large leukoesterase, negative nitrite, few bacteria, 21-50 WBCs.  Urine culture with E. coli with resistance to ampicillin and Bactrim, indeterminate to cefazolin. --Continues with fevers over the past 24 hours, 100.1, although now down to 97.8 --Appetite improving --Continue IV Levaquin 750 mg every 24 hours until fever free for 24 hours then will transition to oral antibiotics and likely discharge home possibly tomorrow --Supportive care, antiemetics, antipyretics  BPH Follows with [facility] outpatient.  Continue tamsulosin.  Bladder scan early this morning  showed 233mL retained urine.  Continue to monitor closely.  May need closer outpatient follow-up on discharge.  Parkinson's disease --Continue home carbidopa-levodopa 25-100 mg p.o. 3 times daily   DVT prophylaxis: Lovenox Code Status: Full code Family Communication: Discussed over telephone with patient's daughter Jeremiah Zimmerman this morning Disposition Plan: Plan discharge home, likely tomorrow if remains fever free   Consultants:   None  Procedures:   none  Antimicrobials:  Levaquin 6/1>>   Subjective: Patient seen and examined at bedside, started to tolerate diet.  Continues with some low-grade temperature overnight of 100.1.  Although much improved from 104.0 on admission.  Urinary symptoms such as dysuria and increased frequency improving, but not completely subsided.  No other complaints at this time.  Denies headache, no visual changes, no chest pain, no palpitations, no shortness of breath, no abdominal pain, no issues with bowel function.  No acute events overnight per nursing staff.  Objective: Vitals:   04/18/19 0510 04/18/19 0914 04/18/19 1101 04/18/19 1302  BP: (!) 122/53 (!) 126/55 (!) 127/58 (!) 128/59  Pulse: 62 77 78 76  Resp:  17 18 16   Temp: 97.8 F (36.6 C) 98.1 F (36.7 C) 98.4 F (36.9 C) 98 F (36.7 C)  TempSrc: Oral Oral Oral Oral  SpO2: 94% 96% 98% 96%  Weight:      Height:        Intake/Output Summary (Last 24 hours) at 04/18/2019 1322 Last data filed at 04/18/2019 0910 Gross per 24 hour  Intake 523.03 ml  Output 300 ml  Net 223.03 ml   [employer]  04/17/19 0154 04/17/19 0900  Weight: 72.6 kg 74.6 kg    Examination:  General exam: Appears calm and comfortable  Respiratory system: Clear to auscultation. Respiratory effort normal. Cardiovascular system: S1 & S2 heard, RRR. No JVD, murmurs, rubs, gallops or clicks. No pedal edema. Gastrointestinal system: Abdomen is nondistended, soft and nontender. No organomegaly or masses felt. Normal  bowel sounds heard. Central nervous system: Alert and oriented. No focal neurological deficits. Extremities: Symmetric 5 x 5 power. Skin: No rashes, lesions or ulcers Psychiatry: Judgement and insight appear normal. Mood & affect appropriate.     Data Reviewed: I have personally reviewed following labs and imaging studies  CBC: Recent Labs  Lab 04/17/19 0211 04/18/19 0243  WBC 14.7* 15.2*  NEUTROABS 12.6*  --   HGB 14.4 12.2*  HCT 43.8 37.2*  MCV 91.6 92.3  PLT 104* 75*   Basic Metabolic Panel: Recent Labs  Lab 04/17/19 0211 04/18/19 0243  NA 135 136  K 3.8 3.9  CL 103 106  CO2 20* 23  GLUCOSE 147* 117*  BUN 15 9  CREATININE 1.06 0.97  CALCIUM 8.9 8.3*   GFR: Estimated Creatinine Clearance: 59.6 mL/min (by C-G formula based on SCr of 0.97 mg/dL). Liver Function Tests: Recent Labs  Lab 04/17/19 0211  AST 16  ALT 11  ALKPHOS 66  BILITOT 1.7*  PROT 6.8  ALBUMIN 3.6   No results for input(s): LIPASE, AMYLASE in the last 168 hours. No results for input(s): AMMONIA in the last 168 hours. Coagulation Profile: No results for input(s): INR, PROTIME in the last 168 hours. Cardiac Enzymes: No results for input(s): CKTOTAL, CKMB, CKMBINDEX, TROPONINI in the last 168 hours. BNP (last 3 results) No results for input(s): PROBNP in the last 8760 hours. HbA1C: No results for input(s): HGBA1C in the last 72 hours. CBG: No results for input(s): GLUCAP in the last 168 hours. Lipid Profile: No results for input(s): CHOL, HDL, LDLCALC, TRIG, CHOLHDL, LDLDIRECT in the last 72 hours. Thyroid Function Tests: No results for input(s): TSH, T4TOTAL, FREET4, T3FREE, THYROIDAB in the last 72 hours. Anemia Panel: No results for input(s): VITAMINB12, FOLATE, FERRITIN, TIBC, IRON, RETICCTPCT in the last 72 hours. Sepsis Labs: Recent Labs  Lab 04/17/19 0211 04/17/19 0417  PROCALCITON <0.10  --   LATICACIDVEN 2.2* 1.9    Recent Results (from the past 240 hour(s))  Blood  Culture (routine x 2)     Status: None (Preliminary result)   Collection Time: 04/17/19  2:09 AM  Result Value Ref Range Status   Specimen Description BLOOD RIGHT HAND  Final   Special Requests   Final    BOTTLES DRAWN AEROBIC AND ANAEROBIC Blood Culture adequate volume   Culture   Final    NO GROWTH 1 DAY Performed at [facility], [place] [address] [place], [place] [address]    Report Status PENDING  Incomplete  Blood Culture (routine x 2)     Status: None (Preliminary result)   Collection Time: 04/17/19  5:09 AM  Result Value Ref Range Status   Specimen Description BLOOD RIGHT ANTECUBITAL  Final   Special Requests   Final    BOTTLES DRAWN AEROBIC AND ANAEROBIC Blood Culture adequate volume   Culture   Final    NO GROWTH 1 DAY Performed at [facility], [place] [address] [place], [place] [address]    Report Status PENDING  Incomplete  Urine culture     Status: Abnormal   Collection Time: 04/17/19  5:35 AM  Result Value Ref Range Status   Specimen Description URINE, RANDOM  Final   Special Requests   Final    NONE Performed at [facility], [address], [place], [place] [address]    Culture >=100,000 COLONIES/mL ESCHERICHIA COLI (A)  Final   Report Status 04/18/2019 FINAL  Final   Organism ID, Bacteria ESCHERICHIA COLI (A)  Final      Susceptibility   Escherichia coli - MIC*    AMPICILLIN >=32 RESISTANT Resistant     CEFAZOLIN <=4 SENSITIVE Sensitive     CEFTRIAXONE <=1 SENSITIVE Sensitive     CIPROFLOXACIN <=0.25 SENSITIVE Sensitive     GENTAMICIN >=16 RESISTANT Resistant     IMIPENEM <=0.25 SENSITIVE Sensitive     NITROFURANTOIN <=16 SENSITIVE Sensitive     TRIMETH/SULFA >=320 RESISTANT Resistant     AMPICILLIN/SULBACTAM 16 INTERMEDIATE Intermediate     PIP/TAZO <=4 SENSITIVE Sensitive     Extended ESBL NEGATIVE Sensitive     * >=100,000 COLONIES/mL ESCHERICHIA COLI  SARS Coronavirus 2 (CEPHEID- Performed in [place] [facility] lab), Hosp Order      Status: None   Collection Time: 04/17/19  6:16 AM  Result Value Ref Range Status   SARS Coronavirus 2 NEGATIVE NEGATIVE Final    Comment: (NOTE) If result is NEGATIVE SARS-CoV-2 target nucleic acids are NOT DETECTED. The SARS-CoV-2 RNA is generally detectable in upper and lower  respiratory specimens during the acute phase of infection. The lowest  concentration of SARS-CoV-2 viral copies this assay can detect is 250  copies / mL. A negative result does not preclude SARS-CoV-2 infection  and should not be used as the sole basis for treatment or other  patient management decisions.  A negative result may occur with  improper specimen collection / handling, submission of specimen other  than nasopharyngeal swab, presence of viral mutation(s) within the  areas targeted by this assay, and inadequate number of viral copies  (<250 copies / mL). A negative result must be combined with clinical  observations, patient history, and epidemiological information. If result is POSITIVE SARS-CoV-2 target nucleic acids are DETECTED. The SARS-CoV-2 RNA is generally detectable in upper and lower  respiratory specimens dur ing the acute phase of infection.  Positive  results are indicative of active infection with SARS-CoV-2.  Clinical  correlation with patient history and other diagnostic information is  necessary to determine patient infection status.  Positive results do  not rule out bacterial infection or co-infection with other viruses. If result is PRESUMPTIVE POSTIVE SARS-CoV-2 nucleic acids MAY BE PRESENT.   A presumptive positive result was obtained on the submitted specimen  and confirmed on repeat testing.  While 2019 novel coronavirus  (SARS-CoV-2) nucleic acids may be present in the submitted sample  additional confirmatory testing may be necessary for epidemiological  and / or clinical management purposes  to differentiate between  SARS-CoV-2 and other Sarbecovirus currently known to  infect humans.  If clinically indicated additional testing with an alternate test  methodology [phone]) is advised. The SARS-CoV-2 RNA is generally  detectable in upper and lower respiratory sp ecimens during the acute  phase of infection. The expected result is Negative. Fact Sheet for Patients:  StrictlyIdeas.no Fact Sheet for Healthcare Providers: BankingDealers.co.za This test is not yet approved or cleared by the [place] FDA and has been authorized for detection and/or diagnosis of SARS-CoV-2 by FDA under an Emergency Use Authorization (EUA).  This EUA will remain in effect (meaning  this test can be used) for the duration of the COVID-19 declaration under Section 564(b)(1) of the Act, 21 U.S.C. section 360bbb-3(b)(1), unless the authorization is terminated or revoked sooner. Performed at [facility], [place] [address] [place], [place] [address]          Radiology Studies: Dg Chest 2 View  Result Date: 04/17/2019 CLINICAL DATA:  85 year old male with fever. EXAM: CHEST - 2 VIEW COMPARISON:  Chest radiograph dated 05/26/2010 FINDINGS: Minimal left lung base densities, likely atelectatic changes. There is no focal consolidation, pleural effusion, or pneumothorax. The cardiac silhouette is within normal limits. Atherosclerotic calcification of the aortic arch. No acute osseous pathology. IMPRESSION: No active cardiopulmonary disease. Electronically Signed   By: Anner Crete M.D.   On: 04/17/2019 02:47        Scheduled Meds: . Carbidopa-Levodopa ER  1 tablet Oral TID  . docusate sodium  100 mg Oral BID  . enoxaparin (LOVENOX) injection  40 mg Subcutaneous Q24H  . sodium chloride flush  3 mL Intravenous Q12H  . tamsulosin  0.4 mg Oral Daily   Continuous Infusions: . levofloxacin (LEVAQUIN) IV 750 mg (04/18/19 0801)     LOS: 0 days    Time spent: 29 minutes    Fouad Taul J [place] DO [facility] Pager  [phone]  If 7PM-7AM, please contact night-coverage [email or web address] Password [email or web address] 04/18/2019, 1:22 PM

## 2019-04-18 NOTE — Progress Notes (Signed)
Pt continue to have difficulty urinating throughout night. Had large urinary incontinent episode in bed. Bladder scan showed 213. On call NP, Baltazar Najjar, paged and made aware. Will continue to monitor and treat per orders.

## 2019-04-18 NOTE — Progress Notes (Signed)
Notified by tele of 2.7 sec pause. Pt alert and asymptomatic. Denies chest pain. NP Baltazar Najjar paged and made aware. Will continue to monitor.

## 2019-04-19 ENCOUNTER — Emergency Department (HOSPITAL_COMMUNITY): Payer: Medicare Other

## 2019-04-19 ENCOUNTER — Encounter (HOSPITAL_COMMUNITY): Payer: Self-pay | Admitting: *Deleted

## 2019-04-19 ENCOUNTER — Other Ambulatory Visit: Payer: Self-pay

## 2019-04-19 ENCOUNTER — Observation Stay (HOSPITAL_COMMUNITY): Payer: Medicare Other

## 2019-04-19 ENCOUNTER — Inpatient Hospital Stay (HOSPITAL_COMMUNITY)
Admission: EM | Admit: 2019-04-19 | Discharge: 2019-04-22 | DRG: 872 | Disposition: A | Discharge status: Home / Self Care | Payer: Medicare Other | Attending: Internal Medicine | Admitting: Internal Medicine

## 2019-04-19 DIAGNOSIS — A4151 Sepsis due to Escherichia coli [E. coli]: Secondary | ICD-10-CM | POA: Diagnosis not present

## 2019-04-19 DIAGNOSIS — C679 Malignant neoplasm of bladder, unspecified: Secondary | ICD-10-CM | POA: Diagnosis present

## 2019-04-19 DIAGNOSIS — K5909 Other constipation: Secondary | ICD-10-CM | POA: Diagnosis present

## 2019-04-19 DIAGNOSIS — Z882 Allergy status to sulfonamides status: Secondary | ICD-10-CM

## 2019-04-19 DIAGNOSIS — G20 Parkinson's disease: Secondary | ICD-10-CM | POA: Diagnosis present

## 2019-04-19 DIAGNOSIS — E785 Hyperlipidemia, unspecified: Secondary | ICD-10-CM

## 2019-04-19 DIAGNOSIS — Z906 Acquired absence of other parts of urinary tract: Secondary | ICD-10-CM

## 2019-04-19 DIAGNOSIS — Z9049 Acquired absence of other specified parts of digestive tract: Secondary | ICD-10-CM

## 2019-04-19 DIAGNOSIS — A4150 Gram-negative sepsis, unspecified: Secondary | ICD-10-CM

## 2019-04-19 DIAGNOSIS — I10 Essential (primary) hypertension: Secondary | ICD-10-CM

## 2019-04-19 DIAGNOSIS — Z20828 Contact with and (suspected) exposure to other viral communicable diseases: Secondary | ICD-10-CM | POA: Diagnosis present

## 2019-04-19 DIAGNOSIS — Z8551 Personal history of malignant neoplasm of bladder: Secondary | ICD-10-CM

## 2019-04-19 DIAGNOSIS — Z880 Allergy status to penicillin: Secondary | ICD-10-CM

## 2019-04-19 DIAGNOSIS — N401 Enlarged prostate with lower urinary tract symptoms: Secondary | ICD-10-CM | POA: Diagnosis present

## 2019-04-19 DIAGNOSIS — Z881 Allergy status to other antibiotic agents status: Secondary | ICD-10-CM

## 2019-04-19 DIAGNOSIS — G20A1 Parkinson's disease without dyskinesia, without mention of fluctuations: Secondary | ICD-10-CM | POA: Diagnosis present

## 2019-04-19 DIAGNOSIS — R509 Fever, unspecified: Secondary | ICD-10-CM | POA: Diagnosis not present

## 2019-04-19 DIAGNOSIS — N3001 Acute cystitis with hematuria: Secondary | ICD-10-CM | POA: Diagnosis present

## 2019-04-19 DIAGNOSIS — Z888 Allergy status to other drugs, medicaments and biological substances status: Secondary | ICD-10-CM

## 2019-04-19 DIAGNOSIS — R6510 Systemic inflammatory response syndrome (SIRS) of non-infectious origin without acute organ dysfunction: Secondary | ICD-10-CM

## 2019-04-19 DIAGNOSIS — Z66 Do not resuscitate: Secondary | ICD-10-CM | POA: Diagnosis present

## 2019-04-19 DIAGNOSIS — N410 Acute prostatitis: Secondary | ICD-10-CM | POA: Diagnosis present

## 2019-04-19 DIAGNOSIS — I341 Nonrheumatic mitral (valve) prolapse: Secondary | ICD-10-CM | POA: Diagnosis present

## 2019-04-19 DIAGNOSIS — Z87440 Personal history of urinary (tract) infections: Secondary | ICD-10-CM

## 2019-04-19 DIAGNOSIS — N390 Urinary tract infection, site not specified: Secondary | ICD-10-CM | POA: Diagnosis present

## 2019-04-19 DIAGNOSIS — N320 Bladder-neck obstruction: Secondary | ICD-10-CM | POA: Diagnosis present

## 2019-04-19 DIAGNOSIS — Z1629 Resistance to other single specified antibiotic: Secondary | ICD-10-CM | POA: Diagnosis present

## 2019-04-19 DIAGNOSIS — Z1611 Resistance to penicillins: Secondary | ICD-10-CM | POA: Diagnosis present

## 2019-04-19 DIAGNOSIS — R338 Other retention of urine: Secondary | ICD-10-CM | POA: Diagnosis present

## 2019-04-19 DIAGNOSIS — D696 Thrombocytopenia, unspecified: Secondary | ICD-10-CM | POA: Diagnosis present

## 2019-04-19 DIAGNOSIS — R140 Abdominal distension (gaseous): Secondary | ICD-10-CM

## 2019-04-19 LAB — CBC WITH DIFFERENTIAL/PLATELET
Abs Immature Granulocytes: 0.08 10*3/uL — ABNORMAL HIGH (ref 0.00–0.07)
Basophils Absolute: 0 10*3/uL (ref 0.0–0.1)
Basophils Relative: 0 %
Eosinophils Absolute: 0 10*3/uL (ref 0.0–0.5)
Eosinophils Relative: 0 %
HCT: 38.5 % — ABNORMAL LOW (ref 39.0–52.0)
Hemoglobin: 12.2 g/dL — ABNORMAL LOW (ref 13.0–17.0)
Immature Granulocytes: 1 %
Lymphocytes Relative: 7 %
Lymphs Abs: 0.6 10*3/uL — ABNORMAL LOW (ref 0.7–4.0)
MCH: 29.4 pg (ref 26.0–34.0)
MCHC: 31.7 g/dL (ref 30.0–36.0)
MCV: 92.8 fL (ref 80.0–100.0)
Monocytes Absolute: 0.7 10*3/uL (ref 0.1–1.0)
Monocytes Relative: 9 %
Neutro Abs: 6.6 10*3/uL (ref 1.7–7.7)
Neutrophils Relative %: 83 %
Platelets: 98 10*3/uL — ABNORMAL LOW (ref 150–400)
RBC: 4.15 MIL/uL — ABNORMAL LOW (ref 4.22–5.81)
RDW: 12.9 % (ref 11.5–15.5)
WBC: 8 10*3/uL (ref 4.0–10.5)
nRBC: 0 % (ref 0.0–0.2)

## 2019-04-19 LAB — BASIC METABOLIC PANEL
Anion gap: 8 (ref 5–15)
Anion gap: 5 (ref 5–15)
BUN: 12 mg/dL (ref 8–23)
BUN: 13 mg/dL (ref 8–23)
CO2: 23 mmol/L (ref 22–32)
CO2: 24 mmol/L (ref 22–32)
Calcium: 8.3 mg/dL — ABNORMAL LOW (ref 8.9–10.3)
Calcium: 8.2 mg/dL — ABNORMAL LOW (ref 8.9–10.3)
Chloride: 105 mmol/L (ref 98–111)
Chloride: 107 mmol/L (ref 98–111)
Creatinine, Ser: 0.89 mg/dL (ref 0.61–1.24)
Creatinine, Ser: 0.84 mg/dL (ref 0.61–1.24)
GFR calc Af Amer: >60 mL/min (ref >60)
GFR calc Af Amer: >60 mL/min (ref >60)
GFR calc non Af Amer: >60 mL/min (ref >60)
GFR calc non Af Amer: >60 mL/min (ref >60)
Glucose, Bld: 116 mg/dL — ABNORMAL HIGH (ref 70–99)
Glucose, Bld: 129 mg/dL — ABNORMAL HIGH (ref 70–99)
Potassium: 3.9 mmol/L (ref 3.5–5.1)
Potassium: 3.6 mmol/L (ref 3.5–5.1)
Sodium: 136 mmol/L (ref 135–145)
Sodium: 136 mmol/L (ref 135–145)

## 2019-04-19 LAB — URINALYSIS, ROUTINE W REFLEX MICROSCOPIC
Bilirubin Urine: NEGATIVE
Glucose, UA: NEGATIVE mg/dL
Ketones, ur: 5 mg/dL — AB
Nitrite: POSITIVE — AB
Protein, ur: 100 mg/dL — AB
RBC / HPF: >50 RBC/hpf — ABNORMAL HIGH (ref 0–5)
Specific Gravity, Urine: 1.018 (ref 1.005–1.030)
WBC, UA: >50 WBC/hpf — ABNORMAL HIGH (ref 0–5)
pH: 7 (ref 5.0–8.0)

## 2019-04-19 LAB — CBC
HCT: 35.3 % — ABNORMAL LOW (ref 39.0–52.0)
Hemoglobin: 11.8 g/dL — ABNORMAL LOW (ref 13.0–17.0)
MCH: 30.4 pg (ref 26.0–34.0)
MCHC: 33.4 g/dL (ref 30.0–36.0)
MCV: 91 fL (ref 80.0–100.0)
Platelets: 78 10*3/uL — ABNORMAL LOW (ref 150–400)
RBC: 3.88 MIL/uL — ABNORMAL LOW (ref 4.22–5.81)
RDW: 13 % (ref 11.5–15.5)
WBC: 10.9 10*3/uL — ABNORMAL HIGH (ref 4.0–10.5)
nRBC: 0 % (ref 0.0–0.2)

## 2019-04-19 LAB — LACTIC ACID, PLASMA: Lactic Acid, Venous: 1 mmol/L (ref 0.5–1.9)

## 2019-04-19 LAB — SARS CORONAVIRUS 2 BY RT PCR (HOSPITAL ORDER, PERFORMED IN ~~LOC~~ HOSPITAL LAB): SARS Coronavirus 2: NEGATIVE

## 2019-04-19 MED ORDER — MORPHINE SULFATE (PF) 2 MG/ML IV SOLN
2.0000 mg | Freq: Once | INTRAVENOUS | Status: AC
  Administered 2019-04-19: 20:00:00 2 mg via INTRAVENOUS
  Filled 2019-04-19: qty 1

## 2019-04-19 MED ORDER — PHENAZOPYRIDINE HCL 100 MG PO TABS: 100.0000 mg | ORAL_TABLET | Freq: Three times a day (TID) | ORAL | 0 refills | Status: DC

## 2019-04-19 MED ORDER — PHENAZOPYRIDINE HCL 100 MG PO TABS
100.0000 mg | ORAL_TABLET | Freq: Three times a day (TID) | ORAL | Status: DC
  Administered 2019-04-19: 100 mg via ORAL
  Filled 2019-04-19 (×2): qty 1

## 2019-04-19 MED ORDER — LEVOFLOXACIN 500 MG PO TABS: 500.0000 mg | ORAL_TABLET | Freq: Every day | ORAL | 0 refills | Status: DC

## 2019-04-19 MED ORDER — SODIUM CHLORIDE 0.9 % IV SOLN
2.0000 g | Freq: Once | INTRAVENOUS | Status: AC
  Administered 2019-04-19: 2 g via INTRAVENOUS
  Filled 2019-04-19: qty 20

## 2019-04-19 MED ORDER — ACETAMINOPHEN 325 MG PO TABS
650.0000 mg | ORAL_TABLET | Freq: Once | ORAL | Status: AC
  Administered 2019-04-19: 650 mg via ORAL
  Filled 2019-04-19: qty 2

## 2019-04-19 MED FILL — PHENAZOPYRIDINE 100 MG TAB: 100 | 3 days supply | Qty: 10 | Fill #0

## 2019-04-19 MED FILL — levoFLOXacin 500 MG TABS: 500 | 3 days supply | Qty: 3 | Fill #0

## 2019-04-19 NOTE — ED Notes (Signed)
ED TO INPATIENT HANDOFF REPORT  ED Nurse Name and Phone #: Fredonia Highland RN [phone]  S Name/Age/Gender Jeremiah Zimmerman 85 y.o. male Room/Bed: WA25/WA25  Code Status   Code Status: Prior  Home/SNF/Other Home Patient oriented to: self, place, time and situation Is this baseline? Yes   Triage Complete: Triage complete  Chief Complaint fever; urinary  Triage Note Pt BIB EMS and presents after being discharged from [facility] for a UTI admission.  Pt continues to have pain upon urination and has a fever.  EMS's temperature was 101.  Pt a/o x 4.  Pt denies any other symptoms to EMS.   Allergies Allergies  Allergen Reactions  . Erythromycin Nausea Only    REACTION: Intolerance to this medication with upset stomach  . Nitroglycerin Other (See Comments)    REACTION: Intolerance to this medication.  It drops his BP too low and quickly.  . Sulfa Antibiotics Nausea Only and Rash  . Tetracyclines & Related Nausea Only  . Amoxicillin Diarrhea and Nausea Only  . Penicillins Swelling and Rash    Has patient had a PCN reaction causing immediate rash, facial/tongue/throat swelling, SOB or lightheadedness with hypotension: Yes Has patient had a PCN reaction causing severe rash involving mucus membranes or skin necrosis: No Has patient had a PCN reaction that required hospitalization: No Has patient had a PCN reaction occurring within the last 10 years: No If all of the above answers are "NO", then may proceed with Cephalosporin use.   REACTION: Patient develops a rash and swelling at inject    Level of Care/Admitting Diagnosis ED Disposition    ED Disposition Condition [place]: [facility] [[id number]]  Level of Care: Telemetry [5]  Admit to tele based on following criteria: Other see comments  Comments: SEPSIS  Covid Evaluation: N/A  Diagnosis: UTI (urinary tract infection) [354562]  Admitting Physician: Toy Baker [[id number]]  Attending Physician: Toy Baker [[id number]]  PT Class (Do Not Modify): Observation [104]  PT Acc Code (Do Not Modify): Observation [10022]       B Medical/Surgery History Past Medical History:  Diagnosis Date  . Adenomatous colon polyp   . Bladder cancer ([place]) 2010  . BPH (benign prostatic hyperplasia)   . Heart murmur   . Hyperlipidemia   . Hypertension   . MVP (mitral valve prolapse) 01-13-13   hx. LBBB previous EKG 11'11  in Epic.  Marland Kitchen Parkinson's disease ([place])   . SBO (small bowel obstruction) ([place]) 07/18/2018  . Seasonal allergies 01-13-13   Past Surgical History:  Procedure Laterality Date  . APPENDECTOMY    . BLADDER SURGERY  2010   bladder cancer in 2010/Post surgery bleed  . CARDIAC CATHETERIZATION  01-13-13   greater than 5 yrs ago-negative findings  . CHOLECYSTECTOMY N/A 01/27/2017   Procedure: LAPAROSCOPIC CHOLECYSTECTOMY WITH INTRAOPERATIVE CHOLANGIOGRAM;  Surgeon: Rolm Bookbinder, MD;  Location: [facility];  Service: General;  Laterality: N/A;  . COLONOSCOPY  01-13-13   hx.colon polyps excised-last 3-4 yrs ago  . ELBOW SURGERY     left  . KNEE ARTHROSCOPY WITH MEDIAL MENISECTOMY Right 01/20/2013   Procedure: KNEE ARTHROSCOPY WITH MEDIAL MENISECTOMY;  Surgeon: Gearlean Alf, MD;  Location: [facility];  Service: Orthopedics;  Laterality: Right;  right knee arthroscopy with medial meniscal debridement and chrondroplasty  . TONSILLECTOMY    . TONSILLECTOMY AND ADENOIDECTOMY       A IV Location/Drains/Wounds Patient Lines/Drains/Airways Status   Active Line/Drains/Airways  Name:   Placement date:   Placement time:   Site:   Days:   Peripheral IV 04/19/19 Left Hand   04/19/19    2000    Hand   less than 1   Peripheral IV 04/19/19 Right Wrist   04/19/19    2010    Wrist   less than 1   Incision 01/20/13 Leg   01/20/13    1732     2280   Incision (Closed) 01/27/17 Abdomen Other (Comment)   01/27/17    1055     812   Incision - 4 Ports Abdomen 1: Umbilicus 2: Mid;Upper  3: Right;Medial 4: Right;Lateral   01/27/17    -     812          Intake/Output Last 24 hours No intake or output data in the 24 hours ending 04/19/19 2337  Labs/Imaging Results for orders placed or performed during the hospital encounter of 04/19/19 (from the past 48 hour(s))  CBC with Differential     Status: Abnormal   Collection Time: 04/19/19  7:11 PM  Result Value Ref Range   WBC 8.0 4.0 - 10.5 K/uL   RBC 4.15 (L) 4.22 - 5.81 MIL/uL   Hemoglobin 12.2 (L) 13.0 - 17.0 g/dL   HCT 38.5 (L) 39.0 - 52.0 %   MCV 92.8 80.0 - 100.0 fL   MCH 29.4 26.0 - 34.0 pg   MCHC 31.7 30.0 - 36.0 g/dL   RDW 12.9 11.5 - 15.5 %   Platelets 98 (L) 150 - 400 K/uL    Comment: REPEATED TO VERIFY PLATELET COUNT CONFIRMED BY SMEAR SPECIMEN CHECKED FOR CLOTS Immature Platelet Fraction may be clinically indicated, consider ordering this additional test [id number]    nRBC 0.0 0.0 - 0.2 %   Neutrophils Relative % 83 %   Neutro Abs 6.6 1.7 - 7.7 K/uL   Lymphocytes Relative 7 %   Lymphs Abs 0.6 (L) 0.7 - 4.0 K/uL   Monocytes Relative 9 %   Monocytes Absolute 0.7 0.1 - 1.0 K/uL   Eosinophils Relative 0 %   Eosinophils Absolute 0.0 0.0 - 0.5 K/uL   Basophils Relative 0 %   Basophils Absolute 0.0 0.0 - 0.1 K/uL   RBC Morphology MORPHOLOGY UNREMARKABLE    Immature Granulocytes 1 %   Abs Immature Granulocytes 0.08 (H) 0.00 - 0.07 K/uL    Comment: Performed at [facility], [place] [address] [place], [place] [address]  Basic metabolic panel     Status: Abnormal   Collection Time: 04/19/19  7:11 PM  Result Value Ref Range   Sodium 136 135 - 145 mmol/L   Potassium 3.6 3.5 - 5.1 mmol/L   Chloride 107 98 - 111 mmol/L   CO2 24 22 - 32 mmol/L   Glucose, Bld 129 (H) 70 - 99 mg/dL   BUN 13 8 - 23 mg/dL   Creatinine, Ser 0.84 0.61 - 1.24 mg/dL   Calcium 8.2 (L) 8.9 - 10.3 mg/dL   GFR calc non Af Amer >60 >60 mL/min   GFR calc Af Amer >60 >60 mL/min   Anion gap 5 5 - 15    Comment:  Performed at [facility], [place] [address], [place],  [address]  Urinalysis, Routine w reflex microscopic     Status: Abnormal   Collection Time: 04/19/19  7:11 PM  Result Value Ref Range   Color, Urine AMBER (A) YELLOW    Comment: BIOCHEMICALS MAY BE  AFFECTED BY COLOR   APPearance CLOUDY (A) CLEAR   Specific Gravity, Urine 1.018 1.005 - 1.030   pH 7.0 5.0 - 8.0   Glucose, UA NEGATIVE NEGATIVE mg/dL   Hgb urine dipstick SMALL (A) NEGATIVE   Bilirubin Urine NEGATIVE NEGATIVE   Ketones, ur 5 (A) NEGATIVE mg/dL   Protein, ur 100 (A) NEGATIVE mg/dL   Nitrite POSITIVE (A) NEGATIVE   Leukocytes,Ua MODERATE (A) NEGATIVE   RBC / HPF >50 (H) 0 - 5 RBC/hpf   WBC, UA >50 (H) 0 - 5 WBC/hpf   Bacteria, UA RARE (A) NONE SEEN   WBC Clumps PRESENT     Comment: Performed at [facility], [place] [address], [place], [place] [address]  Lactic acid, plasma     Status: None   Collection Time: 04/19/19  7:12 PM  Result Value Ref Range   Lactic Acid, Venous 1.0 0.5 - 1.9 mmol/L    Comment: Performed at [facility], [place] [address], [place], [place] [address]  SARS Coronavirus 2 (CEPHEID - Performed in [place] hospital lab), Hosp Order     Status: None   Collection Time: 04/19/19  7:12 PM  Result Value Ref Range   SARS Coronavirus 2 NEGATIVE NEGATIVE    Comment: (NOTE) If result is NEGATIVE SARS-CoV-2 target nucleic acids are NOT DETECTED. The SARS-CoV-2 RNA is generally detectable in upper and lower  respiratory specimens during the acute phase of infection. The lowest  concentration of SARS-CoV-2 viral copies this assay can detect is 250  copies / mL. A negative result does not preclude SARS-CoV-2 infection  and should not be used as the sole basis for treatment or other  patient management decisions.  A negative result may occur with  improper specimen collection / handling, submission of specimen other  than nasopharyngeal swab,  presence of viral mutation(s) within the  areas targeted by this assay, and inadequate number of viral copies  (<250 copies / mL). A negative result must be combined with clinical  observations, patient history, and epidemiological information. If result is POSITIVE SARS-CoV-2 target nucleic acids are DETECTED. The SARS-CoV-2 RNA is generally detectable in upper and lower  respiratory specimens dur ing the acute phase of infection.  Positive  results are indicative of active infection with SARS-CoV-2.  Clinical  correlation with patient history and other diagnostic information is  necessary to determine patient infection status.  Positive results do  not rule out bacterial infection or co-infection with other viruses. If result is PRESUMPTIVE POSTIVE SARS-CoV-2 nucleic acids MAY BE PRESENT.   A presumptive positive result was obtained on the submitted specimen  and confirmed on repeat testing.  While 2019 novel coronavirus  (SARS-CoV-2) nucleic acids may be present in the submitted sample  additional confirmatory testing may be necessary for epidemiological  and / or clinical management purposes  to differentiate between  SARS-CoV-2 and other Sarbecovirus currently known to infect humans.  If clinically indicated additional testing with an alternate test  methodology [phone]) is advised. The SARS-CoV-2 RNA is generally  detectable in upper and lower respiratory sp ecimens during the acute  phase of infection. The expected result is Negative. Fact Sheet for Patients:  StrictlyIdeas.no Fact Sheet for Healthcare Providers: BankingDealers.co.za This test is not yet approved or cleared by the [place] FDA and has been authorized for detection and/or diagnosis of SARS-CoV-2 by FDA under an Emergency Use Authorization (EUA).  This EUA will remain in effect (meaning this test can  be used) for the duration of the COVID-19 declaration under  Section 564(b)(1) of the Act, 21 U.S.C. section 360bbb-3(b)(1), unless the authorization is terminated or revoked sooner. Performed at [facility], [place] [address] [place], [place] [address]    Dg Chest Portable 1 View  Result Date: 04/19/2019 CLINICAL DATA:  85 year old male with recent diagnosis of UTI. EXAM: PORTABLE CHEST 1 VIEW COMPARISON:  Chest radiograph dated 04/17/2019 FINDINGS: Shallow inspiration. There is no focal consolidation, or pneumothorax. Blunting of the left costophrenic angle likely related to atelectasis although trace pleural effusion is not excluded. Top-normal cardiac silhouette. No acute osseous pathology. IMPRESSION: No focal consolidation. Electronically Signed   By: Anner Crete M.D.   On: 04/19/2019 20:03    Pending Labs Unresulted Labs (From admission, onward)    Start     Ordered   04/19/19 1912  Urine culture  ONCE - STAT,   STAT     04/19/19 1912   04/19/19 1912  Blood culture (routine x 2)  BLOOD CULTURE X 2,   STAT     04/19/19 1912   04/19/19 1912  Lactic acid, plasma  Now then every 2 hours,   STAT     04/19/19 1912   Signed and Held  Magnesium  Tomorrow morning,   R    Comments:  Call MD if <1.5    Signed and Held   Signed and Held  Phosphorus  Tomorrow morning,   R     Signed and Held   Signed and Held  TSH  Once,   R    Comments:  Cancel if already done within 1 month and notify MD    Signed and Held   Signed and Held  Comprehensive metabolic panel  Once,   R    Comments:  Cal MD for K<3.5 or >5.0    Signed and Held   Signed and Held  CBC  Once,   R    Comments:  Call for hg <8.0    Signed and Held          Vitals/Pain Today's Vitals   04/19/19 2200 04/19/19 2225 04/19/19 2230 04/19/19 2243  BP: (!) 147/64  (!) 149/69   Pulse: 84  91   Resp: 19  (!) 23   Temp:    98.1 F (36.7 C)  TempSrc:    Oral  SpO2: 93%  93%   Weight:      Height:      PainSc:  2       Isolation Precautions No active  isolations  Medications Medications  morphine 2 MG/ML injection 2 mg (2 mg Intravenous Given 04/19/19 2001)  acetaminophen (TYLENOL) tablet 650 mg (650 mg Oral Given 04/19/19 2126)  cefTRIAXone (ROCEPHIN) 2 g in sodium chloride 0.9 % 100 mL IVPB (0 g Intravenous Stopped 04/19/19 2225)    Mobility walks with device Low fall risk   Focused Assessments Renal Assessment Handoff:    R Recommendations: See Admitting Provider Note  Report given to: Benjamine Mola RN  Additional Notes

## 2019-04-19 NOTE — Progress Notes (Signed)
Pt given discharge instructions, prescriptions, and care notes. Pt verbalized understanding AEB no further questions or concerns at this time. IV was discontinued, no redness, pain, or swelling noted at this time. Telemetry discontinued and Centralized Telemetry was notified. Pt left the floor via wheelchair with staff in stable condition. 

## 2019-04-19 NOTE — ED Notes (Signed)
X-ray at bedside

## 2019-04-19 NOTE — Patient Care Conference (Signed)
Tried calling patient's daughter at number listed. No answer.

## 2019-04-19 NOTE — ED Triage Notes (Signed)
Pt BIB EMS and presents after being discharged from [facility] for a UTI admission.  Pt continues to have pain upon urination and has a fever.  EMS's temperature was 101.  Pt a/o x 4.  Pt denies any other symptoms to EMS.

## 2019-04-19 NOTE — H&P (Signed)
Jeremiah Zimmerman WLN:[id number] DOB: 1933-12-04 DOA: 04/19/2019     PCP: Shon Baton, MD   Outpatient Specialists:    NEurology     Dr. TAT   Urology Dr.  Jeffie Pollock  Patient arrived to ER on 04/19/19 at 1757  Patient coming from: home Lives With family   Chief Complaint:  Chief Complaint  Patient presents with  . Fever  . Urinary Tract Infection    HPI: Jeremiah Zimmerman is a 85 y.o. male with medical history significant of SBO; HTN; HLD;[name];and bladder cancer (2010)     Presented with   fever and chills.  Started since Saturday fever went up as high as 104 associated some nausea patient has chronic constipation and does endorse some dysuria and frequency.  He gets frequent UTIs but never caused a fever this severe.  Reports last time he had blood in his urine was about a month ago. He was started on Levaquin for urinary tract infection due antibiotic allergies.  Diagnosed with sepsis due to gram-negative UTI Was admitted from 6/1-6/3 to the hospital treated with IV antibiotics. Urine grew E. coli which was sensitive to ciprofloxacin COVID-19 was tested and was negative Just discharged to home today at 1PM from [facility] Arrived home and developed another fever up to 101 still has pain with urination. Just who advised him to come to ER for further evaluation. Patient has had repeated cystoscopies over past 6 weeks no lesions or masses were noted. Patient otherwise denies any upper respiratory complaints no shortness of breath or cough.   Infectious risk factors:  Reports  fever,       In RAPID COVID TEST NEGATIVE     Regarding pertinent Chronic problems:        Parkinson disease on  carbidopa levodopa BPH on Flomax  History of bladder cancer followed by urology  While in ER: Still persistently febrile most likely source being undertreated UTI Discussed  urologist who agrees with patient to be admitted and will see him in the  morning Patient a bladder scan and showed only 45 mL of urine. Started on Rocephin and given a dose of morphine. The following Work up has been ordered so far:  Orders Placed This Encounter  Procedures  . Urine culture  . Blood culture (routine x 2)  . SARS Coronavirus 2 (CEPHEID - Performed in [place] hospital lab), [facility]  . DG Chest Portable 1 View  . DG Abd 1 View  . CBC with Differential  . Basic metabolic panel  . Urinalysis, Routine w reflex microscopic  . Magnesium  . Phosphorus  . TSH  . Comprehensive metabolic panel  . CBC  . Lactic acid, plasma  . Procalcitonin  . Diet Heart Room service appropriate? Yes; Fluid consistency: Thin  . Bladder scan  . Check Rectal Temperature  . Cardiac monitoring  . Vital signs  . Notify physician  . Up with assistance  . If patient diabetic or glucose greater than 140 notify physician for Sliding Scale Insulin Orders  . May go off telemetry for tests/procedures  . Oral care per nursing protocol  . Initiate Oral Care Protocol  . Initiate Carrier Fluid Protocol  . RN may order General Admission PRN Orders utilizing "General Admission PRN medications" (through manage orders) for the following patient needs: allergy symptoms (Claritin), cold sores (Carmex), cough (Robitussin DM), eye irritation (Liquifilm Tears), hemorrhoids (Tucks), indigestion (Maalox), minor skin irritation (Hydrocortisone Cream), muscle pain [facility]  Gay), nose irritation (saline nasal spray) and sore throat (Chloraseptic spray).  . SCDs  . Patient has an active order for admit to inpatient/place in observation  . Cardiac Monitoring Continuous x 12 hours Indications for use: Other; other indications for use: sepsis  . If lactate (lactic acid) >2, verify repeat lactic acid order has been placed.  . Document vital signs within 1-hour of fluid bolus completion and notify provider of bolus completion  . Vital signs  . Refer to Sidebar Report for: Sepsis Bundle ED/IP   . Do not attempt resuscitation (DNR)  . Consult to urology  ALL PATIENTS BEING ADMITTED/HAVING PROCEDURES NEED COVID-19 SCREENING  . Consult to hospitalist  ALL PATIENTS BEING ADMITTED/HAVING PROCEDURES NEED COVID-19 SCREENING  . May transport without cardiac monitor  . OT eval and treat  . PT eval and treat  . Pulse oximetry check with vital signs  . Oxygen therapy Mode or (Route): Nasal cannula; Liters Per Minute: 2; Keep 02 saturation: greater than 92 %  . Incentive spirometry  . EKG 12-Lead  . EKG 12-Lead  . Insert peripheral IV  . Place in observation (patient's expected length of stay will be less than 2 midnights)    Following Medications were ordered in ER: Medications  docusate sodium (COLACE) capsule 100 mg (has no administration in time range)  polyethylene glycol (MIRALAX / GLYCOLAX) packet 17 g (has no administration in time range)  phenazopyridine (PYRIDIUM) tablet 100 mg (has no administration in time range)  tamsulosin (FLOMAX) capsule 0.4 mg (has no administration in time range)  Carbidopa-Levodopa ER (SINEMET CR) 25-100 MG tablet controlled release 1 tablet (has no administration in time range)  acetaminophen (TYLENOL) tablet 650 mg (has no administration in time range)    Or  acetaminophen (TYLENOL) suppository 650 mg (has no administration in time range)  HYDROcodone-acetaminophen (NORCO/VICODIN) 5-325 MG per tablet 1-2 tablet (2 tablets Oral Given 04/20/19 0036)  ondansetron (ZOFRAN) tablet 4 mg (has no administration in time range)    Or  ondansetron (ZOFRAN) injection 4 mg (has no administration in time range)  0.9 %  sodium chloride infusion ( Intravenous New Bag/Given 04/20/19 0035)  cefTRIAXone (ROCEPHIN) 1 g in sodium chloride 0.9 % 100 mL IVPB (has no administration in time range)  morphine 2 MG/ML injection 2 mg (2 mg Intravenous Given 04/19/19 2001)  acetaminophen (TYLENOL) tablet 650 mg (650 mg Oral Given 04/19/19 2126)  cefTRIAXone (ROCEPHIN) 2 g in sodium  chloride 0.9 % 100 mL IVPB (0 g Intravenous Stopped 04/19/19 2225)        Consult Orders  (From admission, onward)         Start     Ordered   04/20/19 0012  PT eval and treat  Routine     04/20/19 0011   04/20/19 0012  OT eval and treat  Routine    Question:  Reason for OT?  Answer:  debility   04/20/19 0011          ER Provider Called: URology    Dr.Dolstead They Recommend admit to medicine   Will see in AM    Significant initial  Findings: Abnormal Labs Reviewed  CBC WITH DIFFERENTIAL/PLATELET - Abnormal; Notable for the following components:      Result Value   RBC 4.15 (*)    Hemoglobin 12.2 (*)    HCT 38.5 (*)    Platelets 98 (*)    Lymphs Abs 0.6 (*)    Abs Immature Granulocytes 0.08 (*)  All other components within normal limits  BASIC METABOLIC PANEL - Abnormal; Notable for the following components:   Glucose, Bld 129 (*)    Calcium 8.2 (*)    All other components within normal limits  URINALYSIS, ROUTINE W REFLEX MICROSCOPIC - Abnormal; Notable for the following components:   Color, Urine AMBER (*)    APPearance CLOUDY (*)    Hgb urine dipstick SMALL (*)    Ketones, ur 5 (*)    Protein, ur 100 (*)    Nitrite POSITIVE (*)    Leukocytes,Ua MODERATE (*)    RBC / HPF >50 (*)    WBC, UA >50 (*)    Bacteria, UA RARE (*)    All other components within normal limits     Otherwise labs showing:    Recent Labs  Lab 04/17/19 0211 04/18/19 0243 04/19/19 0303 04/19/19 1911  NA 135 136 136 136  K 3.8 3.9 3.9 3.6  CO2 20* '23 23 24  ' GLUCOSE 147* 117* 116* 129*  BUN '15 9 12 13  ' CREATININE 1.06 0.97 0.89 0.84  CALCIUM 8.9 8.3* 8.3* 8.2*    Cr    stable,   Lab Results  Component Value Date   CREATININE 0.84 04/19/2019   CREATININE 0.89 04/19/2019   CREATININE 0.97 04/18/2019    Recent Labs  Lab 04/17/19 0211  AST 16  ALT 11  ALKPHOS 66  BILITOT 1.7*  PROT 6.8  ALBUMIN 3.6   Lab Results  Component Value Date   CALCIUM 8.2 (L)  04/19/2019     WBC      Component Value Date/Time   WBC 8.0 04/19/2019 1911   ANC    Component Value Date/Time   NEUTROABS 6.6 04/19/2019 1911   ALC No results found for: LYMPHOABS    Plt: Lab Results  Component Value Date   PLT 98 (L) 04/19/2019     Lactic Acid, Venous    Component Value Date/Time   LATICACIDVEN 1.0 04/19/2019 1912       COVID-19 Labs    Lab Results  Component Value Date   SARSCOV2NAA NEGATIVE 04/19/2019   SARSCOV2NAA NEGATIVE 04/17/2019      HG/HCT   Stable     Component Value Date/Time   HGB 12.2 (L) 04/19/2019 1911   HCT 38.5 (L) 04/19/2019 1911     UA   evidence of UTI    Urine analysis:    Component Value Date/Time   COLORURINE AMBER (A) 04/19/2019 1911   APPEARANCEUR CLOUDY (A) 04/19/2019 1911   LABSPEC 1.018 04/19/2019 1911   PHURINE 7.0 04/19/2019 1911   GLUCOSEU NEGATIVE 04/19/2019 1911   HGBUR SMALL (A) 04/19/2019 1911   BILIRUBINUR NEGATIVE 04/19/2019 1911   KETONESUR 5 (A) 04/19/2019 1911   PROTEINUR 100 (A) 04/19/2019 1911   UROBILINOGEN 0.2 05/26/2010 1229   NITRITE POSITIVE (A) 04/19/2019 1911   LEUKOCYTESUR MODERATE (A) 04/19/2019 1911     CXR -  NON acute   ECG:  Not obtained       ED Triage Vitals  Enc Vitals Group     BP 04/19/19 1819 (!) 159/63     Pulse Rate 04/19/19 1819 95     Resp 04/19/19 1819 18     Temp 04/19/19 1819 99.2 F (37.3 C)     Temp Source 04/19/19 1819 Oral     SpO2 04/19/19 1809 96 %     Weight 04/19/19 1823 160 lb (72.6 kg)     Height 04/19/19 1823  '5\' 10"'  (1.778 m)     Head Circumference --      Peak Flow --      Pain Score 04/19/19 1822 5     Pain Loc --      Pain Edu? --      Excl. in [place]? --   TMAX(24)@       Latest  Blood pressure (!) 155/66, pulse 95, temperature (!) 102.3 F (39.1 C), temperature source Rectal, resp. rate 19, height '5\' 10"'  (1.778 m), weight 72.6 kg, SpO2 96 %.    Hospitalist was called for admission for sepsis UTI   Review of Systems:     Pertinent positives include:  Fevers, chills dysuria,  Constitutional:  No weight loss, night sweats,, fatigue, weight loss  HEENT:  No headaches, Difficulty swallowing,Tooth/dental problems,Sore throat,  No sneezing, itching, ear ache, nasal congestion, post nasal drip,  Cardio-vascular:  No chest pain, Orthopnea, PND, anasarca, dizziness, palpitations.no Bilateral lower extremity swelling  GI:  No heartburn, indigestion, abdominal pain, nausea, vomiting, diarrhea, change in bowel habits, loss of appetite, melena, blood in stool, hematemesis Resp:  no shortness of breath at rest. No dyspnea on exertion, No excess mucus, no productive cough, No non-productive cough, No coughing up of blood.No change in color of mucus.No wheezing. Skin:  no rash or lesions. No jaundice GU:  no change in color of urine, no urgency or frequency. No straining to urinate.  No flank pain.  Musculoskeletal:  No joint pain or no joint swelling. No decreased range of motion. No back pain.  Psych:  No change in mood or affect. No depression or anxiety. No memory loss.  Neuro: no localizing neurological complaints, no tingling, no weakness, no double vision, no gait abnormality, no slurred speech, no confusion  All systems reviewed and apart from [place] all are negative  Past Medical History:   Past Medical History:  Diagnosis Date  . Adenomatous colon polyp   . Bladder cancer ([place]) 2010  . BPH (benign prostatic hyperplasia)   . Heart murmur   . Hyperlipidemia   . Hypertension   . MVP (mitral valve prolapse) 01-13-13   hx. LBBB previous EKG 11'11  in Epic.  Marland Kitchen Parkinson's disease ([place])   . SBO (small bowel obstruction) ([place]) 07/18/2018  . Seasonal allergies 01-13-13      Past Surgical History:  Procedure Laterality Date  . APPENDECTOMY    . BLADDER SURGERY  2010   bladder cancer in 2010/Post surgery bleed  . CARDIAC CATHETERIZATION  01-13-13   greater than 5 yrs ago-negative findings  .  CHOLECYSTECTOMY N/A 01/27/2017   Procedure: LAPAROSCOPIC CHOLECYSTECTOMY WITH INTRAOPERATIVE CHOLANGIOGRAM;  Surgeon: Rolm Bookbinder, MD;  Location: [place];  Service: General;  Laterality: N/A;  . COLONOSCOPY  01-13-13   hx.colon polyps excised-last 3-4 yrs ago  . ELBOW SURGERY     left  . KNEE ARTHROSCOPY WITH MEDIAL MENISECTOMY Right 01/20/2013   Procedure: KNEE ARTHROSCOPY WITH MEDIAL MENISECTOMY;  Surgeon: Gearlean Alf, MD;  Location: [facility];  Service: Orthopedics;  Laterality: Right;  right knee arthroscopy with medial meniscal debridement and chrondroplasty  . TONSILLECTOMY    . TONSILLECTOMY AND ADENOIDECTOMY      Social History:  Ambulatory  walker       reports that he has never smoked. He has never used smokeless tobacco. He reports that he does not drink alcohol or use drugs.    Family History:   Family History  Problem Relation Age of Onset  .  Breast cancer Mother   . Prostate cancer Father   . Colon cancer Brother   . Cervical cancer Daughter   . Thyroid cancer Daughter   . Uterine cancer Daughter   . Prostate cancer Brother   . Alzheimer's disease Sister   . Alzheimer's disease Sister     Allergies: Allergies  Allergen Reactions  . Erythromycin Nausea Only    REACTION: Intolerance to this medication with upset stomach  . Nitroglycerin Other (See Comments)    REACTION: Intolerance to this medication.  It drops his BP too low and quickly.  . Sulfa Antibiotics Nausea Only and Rash  . Tetracyclines & Related Nausea Only  . Amoxicillin Diarrhea and Nausea Only  . Penicillins Swelling and Rash    Has patient had a PCN reaction causing immediate rash, facial/tongue/throat swelling, SOB or lightheadedness with hypotension: Yes Has patient had a PCN reaction causing severe rash involving mucus membranes or skin necrosis: No Has patient had a PCN reaction that required hospitalization: No Has patient had a PCN reaction occurring within the last 10 years: No If  all of the above answers are "NO", then may proceed with Cephalosporin use.   REACTION: Patient develops a rash and swelling at inject     Prior to Admission medications   Medication Sig Start Date End Date Taking? Authorizing Provider  Carbidopa-Levodopa ER (SINEMET CR) 25-100 MG tablet controlled release Take 1 tablet by mouth 3 (three) times daily. 03/14/19   Tat, Eustace Quail, DO  docusate sodium (COLACE) 100 MG capsule Take 1 capsule (100 mg total) by mouth 2 (two) times daily as needed for mild constipation. 07/21/18 07/21/19  Rayburn, Floyce Stakes, PA-C  levofloxacin (LEVAQUIN) 500 MG tablet Take 1 tablet (500 mg total) by mouth daily for 3 days. 04/19/19 04/22/19  Donne Hazel, MD  Multiple Vitamin (MULTIVITAMIN WITH MINERALS) TABS tablet Take 1 tablet by mouth daily.    [provider]  phenazopyridine (PYRIDIUM) 100 MG tablet Take 1 tablet (100 mg total) by mouth 3 (three) times daily with meals. 04/19/19   Donne Hazel, MD  polyethylene glycol [facility]) packet Take 17 g by mouth daily as needed for mild constipation. 07/21/18   Rayburn, Floyce Stakes, PA-C  Tamsulosin HCl (FLOMAX) 0.4 MG CAPS Take 0.4 mg by mouth daily.     [provider]  vitamin B-12 (CYANOCOBALAMIN) 500 MCG tablet Take 500 mcg by mouth daily.    [provider]   Physical Exam: Blood pressure (!) 155/66, pulse 95, temperature (!) 102.3 F (39.1 C), temperature source Rectal, resp. rate 19, height '5\' 10"'  (1.778 m), weight 72.6 kg, SpO2 96 %. 1. General:  in No Acute distress   chronically ill  appering 2. Psychological: Alert and  Oriented 3. Head/ENT:    Dry Mucous Membranes                          Head Non traumatic, neck supple                            Poor Dentition 4. SKIN:   decreased Skin turgor,  Skin clean Dry and intact no rash 5. Heart: Regular rate and rhythm no Murmur, no Rub or gallop 6. Lungs:  Clear to auscultation bilaterally, no wheezes or crackles   7. Abdomen: Soft, non-tender,  Non distended  bowel sounds present 8. Lower extremities: no clubbing, cyanosis, no  edema 9. Neurologically Grossly intact, moving all 4 extremities equally   10. MSK: Normal range of motion   All other LABS:     Recent Labs  Lab 04/17/19 0211 04/18/19 0243 04/19/19 0303 04/19/19 1911  WBC 14.7* 15.2* 10.9* 8.0  NEUTROABS 12.6*  --   --  6.6  HGB 14.4 12.2* 11.8* 12.2*  HCT 43.8 37.2* 35.3* 38.5*  MCV 91.6 92.3 91.0 92.8  PLT 104* 75* 78* 98*     Recent Labs  Lab 04/17/19 0211 04/18/19 0243 04/19/19 0303 04/19/19 1911  NA 135 136 136 136  K 3.8 3.9 3.9 3.6  CL 103 106 105 107  CO2 20* '23 23 24  ' GLUCOSE 147* 117* 116* 129*  BUN '15 9 12 13  ' CREATININE 1.06 0.97 0.89 0.84  CALCIUM 8.9 8.3* 8.3* 8.2*     Recent Labs  Lab 04/17/19 0211  AST 16  ALT 11  ALKPHOS 66  BILITOT 1.7*  PROT 6.8  ALBUMIN 3.6       Cultures:    Component Value Date/Time   SDES URINE, RANDOM 04/17/2019 0535   SPECREQUEST  04/17/2019 0535    NONE Performed at [facility], [place] [address], [place], [place] [address]    CULT >=100,000 COLONIES/mL ESCHERICHIA COLI (A) 04/17/2019 0535   REPTSTATUS 04/18/2019 FINAL 04/17/2019 0535     Radiological Exams on Admission: Dg Abd 1 View  Result Date: 04/19/2019 CLINICAL DATA:  Abdominal distension.  Abdominal pain. EXAM: ABDOMEN - 1 VIEW COMPARISON:  07/18/2018 FINDINGS: There is mild gaseous distention of several loops of small bowel scattered throughout the abdomen. These measure up to approximately 2.6 cm. There is no pneumatosis. No free air. A metallic electronic device projects over the right upper quadrant and obscures portions of the abdomen. There is a moderate amount of stool throughout the colon. IMPRESSION: 1. Nonobstructive bowel gas pattern. 2. Moderate amount of stool in the colon. Electronically Signed   By: Constance Holster M.D.   On: 04/19/2019 23:35   Dg Chest Portable 1 View  Result Date: 04/19/2019 CLINICAL DATA:   85 year old male with recent diagnosis of UTI. EXAM: PORTABLE CHEST 1 VIEW COMPARISON:  Chest radiograph dated 04/17/2019 FINDINGS: Shallow inspiration. There is no focal consolidation, or pneumothorax. Blunting of the left costophrenic angle likely related to atelectasis although trace pleural effusion is not excluded. Top-normal cardiac silhouette. No acute osseous pathology. IMPRESSION: No focal consolidation. Electronically Signed   By: Anner Crete M.D.   On: 04/19/2019 20:03    Chart has been reviewed    Assessment/Plan  85 y.o. male with medical history significant of SBO; HTN; HLD;[name];and bladder cancer (2010)   Admitted for sepsis uti  Present on Admission:  . Acute lower UTI -  - treat with Rocephin        await results of urine culture and adjust antibiotic coverage as needed  . Parkinson's disease ([place]) -stable continue home medications . Malignant neoplasm of bladder [facility]) -appreciate urology input per notes patient have had repeat cystoscopies not showing any evidence of recurrence . Dyslipidemia chronic stable currently not on statins . Essential hypertension currently not on blood pressure medications continue to monitor vitals . Sepsis due to gram-negative UTI ([facility]) -  -SIRS criteria met with  Fever elevated RR and HR.     without evidence of end organ damage   -Most likely source being urinary,    - Obtain serial lactic acid and procalcitonin level.  -  Initiate IV antibiotics   - await results of blood and urine culture  - Rehydrate    Other plan as per orders.  DVT prophylaxis:  SCD   Code Status:  DNR/DNI  as per prior admission     Family Communication:   Family not at  Bedside   Disposition Plan:      To home once workup is complete and patient is stable                      Would benefit from PT/OT eval prior to DC  Ordered                                       Consults called: Urology is aware  Admission  status:  ED Disposition    ED Disposition Condition [place]: [place] [[id number]]  Level of Care: Telemetry [5]  Admit to tele based on following criteria: Other see comments  Comments: SEPSIS  Covid Evaluation: N/A  Diagnosis: UTI (urinary tract infection) [366440]  Admitting Physician: Toy Baker [[id number]]  Attending Physician: Toy Baker [[id number]]  PT Class (Do Not Modify): Observation [104]  PT Acc Code (Do Not Modify): Observation [10022]          Obs    Level of care     tele  For 12H    Precautions:   NONE  PPE: Used by the provider:   P100  eye Goggles,  Gloves       Alyissa Whidbee 04/20/2019, 1:13 AM    [facility]     after 2 AM please page floor coverage PA If 7AM-7PM, please contact the day team taking care of the patient using [email or web address]

## 2019-04-19 NOTE — ED Notes (Signed)
Bed: QX47 Expected date:  Expected time:  Means of arrival:  Comments: EMS Fever, Urinary

## 2019-04-19 NOTE — ED Provider Notes (Signed)
[facility] DEPT Provider Note   CSN: [id number] Arrival date & time: 04/19/19  1757    History   Chief Complaint Chief Complaint  Patient presents with  . Fever  . Urinary Tract Infection    HPI Jeremiah Zimmerman is a 85 y.o. male with history of SBO, hypertension, hyperlipidemia, Parkinson's, bladder cancer 2010 status post surgical resection, BPH presents to the ED for fever of 104.3 onset approximately 2 hours prior to arrival.  Associated with intense suprapubic abdominal pain, fullness, urinary urgency, hematuria.  Patient was admitted 6/1 to 6/3 for sepsis due to gram-negative UTI, urine culture grew E. coli with resistance to ampicillin and Bactrim, indeterminate to cefazolin.  He was given IV Levaquin.  He was discharged around 1:00 pm today.  States a couple of hours after being at home he developed a fever again.  Daughter called urologist Dr. Jeffie Pollock who advised him to come to the ER for evaluation.  I spoke with daughter on the phone during initial encounter who provided additional history.  States patient has had 2 cystoscopies in the last 6 weeks for ongoing hematuria and urinary issues.  No lesions or cancerous masses noted.  Patient denies congestion, sore throat, cough, chest pain, shortness of breath, nausea, vomiting, diarrhea.     HPI  Past Medical History:  Diagnosis Date  . Adenomatous colon polyp   . Bladder cancer ([place]) 2010  . BPH (benign prostatic hyperplasia)   . Heart murmur   . Hyperlipidemia   . Hypertension   . MVP (mitral valve prolapse) 01-13-13   hx. LBBB previous EKG 11'11  in Epic.  Marland Kitchen Parkinson's disease ([place])   . SBO (small bowel obstruction) ([place]) 07/18/2018  . Seasonal allergies 01-13-13    Patient Active Problem List   Diagnosis Date Noted  . Acute lower UTI 04/19/2019  . UTI (urinary tract infection) 04/19/2019  . Sepsis ([place]) 04/18/2019  . Sepsis due to gram-negative UTI ([place]) 04/17/2019  . Parkinson's  disease ([place]) 04/17/2019  . Small bowel obstruction ([place]) 07/18/2018  . Right upper quadrant abdominal pain 01/25/2017  . Elevated LFTs 01/25/2017  . Acute medial meniscal tear 01/19/2013  . MUSCLE PAIN 10/15/2010  . OTHER MALAISE AND FATIGUE 10/15/2010  . DYSPNEA 10/15/2010  . Malignant neoplasm of bladder ([place]) 10/14/2010  . Dyslipidemia 10/14/2010  . Essential hypertension 10/14/2010  . BUNDLE BRANCH BLOCK, LEFT 10/14/2010  . GROSS HEMATURIA 10/14/2010    Past Surgical History:  Procedure Laterality Date  . APPENDECTOMY    . BLADDER SURGERY  2010   bladder cancer in 2010/Post surgery bleed  . CARDIAC CATHETERIZATION  01-13-13   greater than 5 yrs ago-negative findings  . CHOLECYSTECTOMY N/A 01/27/2017   Procedure: LAPAROSCOPIC CHOLECYSTECTOMY WITH INTRAOPERATIVE CHOLANGIOGRAM;  Surgeon: Rolm Bookbinder, MD;  Location: [facility];  Service: General;  Laterality: N/A;  . COLONOSCOPY  01-13-13   hx.colon polyps excised-last 3-4 yrs ago  . ELBOW SURGERY     left  . KNEE ARTHROSCOPY WITH MEDIAL MENISECTOMY Right 01/20/2013   Procedure: KNEE ARTHROSCOPY WITH MEDIAL MENISECTOMY;  Surgeon: Gearlean Alf, MD;  Location: [facility];  Service: Orthopedics;  Laterality: Right;  right knee arthroscopy with medial meniscal debridement and chrondroplasty  . TONSILLECTOMY    . TONSILLECTOMY AND ADENOIDECTOMY          Home Medications    Prior to Admission medications   Medication Sig Start Date End Date Taking? Authorizing Provider  Carbidopa-Levodopa ER (SINEMET CR) 25-100 MG  tablet controlled release Take 1 tablet by mouth 3 (three) times daily. 03/14/19   Tat, Eustace Quail, DO  docusate sodium (COLACE) 100 MG capsule Take 1 capsule (100 mg total) by mouth 2 (two) times daily as needed for mild constipation. 07/21/18 07/21/19  Rayburn, Floyce Stakes, PA-C  levofloxacin (LEVAQUIN) 500 MG tablet Take 1 tablet (500 mg total) by mouth daily for 3 days. 04/19/19 04/22/19  Donne Hazel, MD  Multiple Vitamin  (MULTIVITAMIN WITH MINERALS) TABS tablet Take 1 tablet by mouth daily.    [provider]  phenazopyridine (PYRIDIUM) 100 MG tablet Take 1 tablet (100 mg total) by mouth 3 (three) times daily with meals. 04/19/19   Donne Hazel, MD  polyethylene glycol [facility]) packet Take 17 g by mouth daily as needed for mild constipation. 07/21/18   Rayburn, Floyce Stakes, PA-C  Tamsulosin HCl (FLOMAX) 0.4 MG CAPS Take 0.4 mg by mouth daily.     [provider]  vitamin B-12 (CYANOCOBALAMIN) 500 MCG tablet Take 500 mcg by mouth daily.    [provider]    Family History Family History  Problem Relation Age of Onset  . Breast cancer Mother   . Prostate cancer Father   . Colon cancer Brother   . Cervical cancer Daughter   . Thyroid cancer Daughter   . Uterine cancer Daughter   . Prostate cancer Brother   . Alzheimer's disease Sister   . Alzheimer's disease Sister     Social History Social History   Tobacco Use  . Smoking status: Never Smoker  . Smokeless tobacco: Never Used  Substance Use Topics  . Alcohol use: No  . Drug use: No     Allergies   Erythromycin; Nitroglycerin; Sulfa antibiotics; Tetracyclines & related; Amoxicillin; and Penicillins   Review of Systems Review of Systems  Constitutional: Positive for fatigue.  Gastrointestinal: Positive for abdominal pain.  Genitourinary: Positive for difficulty urinating and hematuria.  All other systems reviewed and are negative.    Physical Exam Updated Vital Signs BP (!) 155/66   Pulse 95   Temp (!) 102.3 F (39.1 C) (Rectal)   Resp 19   Ht 5\' 10"  (1.778 m)   Wt 72.6 kg   SpO2 96%   BMI 22.96 kg/m   Physical Exam Vitals signs and nursing note reviewed.  Constitutional:      Appearance: He is well-developed.     Comments: Non toxic, looks uncomfortable holding lower abdomen   HENT:     Head: Normocephalic and atraumatic.     Nose: Nose normal.     Mouth/Throat:     Comments: MMM Eyes:      Conjunctiva/sclera: Conjunctivae normal.  Neck:     Musculoskeletal: Normal range of motion.  Cardiovascular:     Rate and Rhythm: Normal rate and regular rhythm.     Heart sounds: Normal heart sounds.  Pulmonary:     Effort: Pulmonary effort is normal.     Breath sounds: Decreased breath sounds present.     Comments: Slightly diminished lung sounds to RIGHT lower lobe. Normal WOB. No wheezing, crackles.  Abdominal:     General: Bowel sounds are normal.     Palpations: Abdomen is soft.     Tenderness: There is abdominal tenderness.     Comments: Mild suprapubic tenderness. No G/R/R. No obvious distention. No CVA tenderness. Negative Murphy's and McBurney's.  Musculoskeletal: Normal range of motion.  Skin:    General: Skin is warm and dry.  Capillary Refill: Capillary refill takes less than 2 seconds.  Neurological:     Mental Status: He is alert.  Psychiatric:        Behavior: Behavior normal.      ED Treatments / Results  Labs (all labs ordered are listed, but only abnormal results are displayed) Labs Reviewed  CBC WITH DIFFERENTIAL/PLATELET - Abnormal; Notable for the following components:      Result Value   RBC 4.15 (*)    Hemoglobin 12.2 (*)    HCT 38.5 (*)    Platelets 98 (*)    Lymphs Abs 0.6 (*)    Abs Immature Granulocytes 0.08 (*)    All other components within normal limits  BASIC METABOLIC PANEL - Abnormal; Notable for the following components:   Glucose, Bld 129 (*)    Calcium 8.2 (*)    All other components within normal limits  URINALYSIS, ROUTINE W REFLEX MICROSCOPIC - Abnormal; Notable for the following components:   Color, Urine AMBER (*)    APPearance CLOUDY (*)    Hgb urine dipstick SMALL (*)    Ketones, ur 5 (*)    Protein, ur 100 (*)    Nitrite POSITIVE (*)    Leukocytes,Ua MODERATE (*)    RBC / HPF >50 (*)    WBC, UA >50 (*)    Bacteria, UA RARE (*)    All other components within normal limits  SARS CORONAVIRUS 2 (HOSPITAL ORDER,  [facility])  URINE CULTURE  CULTURE, BLOOD (ROUTINE X 2)  CULTURE, BLOOD (ROUTINE X 2)  LACTIC ACID, PLASMA  LACTIC ACID, PLASMA    EKG None  Radiology Dg Chest Portable 1 View  Result Date: 04/19/2019 CLINICAL DATA:  85 year old male with recent diagnosis of UTI. EXAM: PORTABLE CHEST 1 VIEW COMPARISON:  Chest radiograph dated 04/17/2019 FINDINGS: Shallow inspiration. There is no focal consolidation, or pneumothorax. Blunting of the left costophrenic angle likely related to atelectasis although trace pleural effusion is not excluded. Top-normal cardiac silhouette. No acute osseous pathology. IMPRESSION: No focal consolidation. Electronically Signed   By: Anner Crete M.D.   On: 04/19/2019 20:03    Procedures Procedures (including critical care time)  Medications Ordered in ED Medications  morphine 2 MG/ML injection 2 mg (2 mg Intravenous Given 04/19/19 2001)  acetaminophen (TYLENOL) tablet 650 mg (650 mg Oral Given 04/19/19 2126)  cefTRIAXone (ROCEPHIN) 2 g in sodium chloride 0.9 % 100 mL IVPB (2 g Intravenous New Bag/Given (Non-Interop) 04/19/19 2127)     Initial Impression / Assessment and Plan / ED Course  I have reviewed the triage vital signs and the nursing notes.  Pertinent labs & imaging results that were available during my care of the patient were reviewed by me and considered in my medical decision making (see chart for details).  Clinical Course as of Apr 18 2217  Wed Apr 19, 2019  2033 Shallow inspiration. There is no focal consolidation, or pneumothorax. Blunting of the left costophrenic angle likely related to atelectasis although trace pleural effusion is not excluded. Top-normal cardiac silhouette. No acute osseous pathology.  DG Chest Portable 1 View [CG]  2033 Temp(!): 102.3 F (39.1 C) [CG]  2120 Temp(!): 102.3 F (39.1 C) [CG]  2121 Resp(!): 23 [CG]    Clinical Course User Index [CG] Kinnie Feil, PA-C       Concern  for resistance/recurrent UTI vs other nosocomial infection.  Although he denies other infectious symptoms such as URI/LRI symptoms, vomiting, diarrhea.  Sent to [facility] by Dr Jeffie Pollock, anticipate consult. Pending labs including lactic acid, bladder scan, UA, COVID, CXR.   Final Clinical Impressions(s) / ED Diagnoses   Fever 102.3, tachypnea 23, HR >90 with +nitrite UTI despite recent completion of lovoquin.  He has persistent, severe abdominal pain and urgency.  Given this, age, will consult hospitalist for observation/admission.  Rocephin, morphine, tylenol given.  Spoke to Dr Cheron Every (urology) who is aware of patient from Dr Jeffie Pollock, agrees with admission, urology will see in the AM. No emergent urology recommendations.  Bladder scan with 45 cc and he voided in ER. COVID is pending.  2218: Discussed with Dr Roel Cluck who will see patient. Shared with EDP. Final diagnoses:  SIRS (systemic inflammatory response syndrome) ([place])  Acute cystitis with hematuria    ED Discharge Orders    None       Arlean Hopping 04/19/19 2218    Carmin Muskrat, MD 04/20/19 2243

## 2019-04-19 NOTE — Discharge Summary (Signed)
Physician Discharge Summary  Jeremiah Zimmerman RSW:[id number] DOB: 05-Jan-1934 DOA: 04/17/2019  PCP: Shon Baton, MD  Admit date: 04/17/2019 Discharge date: 04/19/2019  Admitted From: Home Disposition:  Home  Recommendations for Outpatient Follow-up:  Follow up with PCP in 2-3 weeks  Discharge Condition:Improved CODE STATUS:DNR Diet recommendation: Regular   Brief/Interim Summary: 85 y.o.malewith medical history significant ofSBO; HTN; HLD;[name];and bladder cancer (2010) presenting with fever.He noted running a fever; his son called the PCP office and they suggested he come to the ER. He was having bad tremors. Symptoms started on Saturday and fever was as high as 104. Severe tremors of hands, chest, abdomen - he has never experienced anything like it. Mild nausea, has chronic constipation. He had mild dysuria and frequency. He has a routine urology appt scheduled for today. He does occasionally get UTIs.  Fever onset last night, up to 104 at home, dysuria for past week. Blood in urine x1 month ago. Numerous allergies to ABx so got started on levaquin for presumed urinary source. CXR neg. First lactate elevated, now down trending.  Discharge Diagnoses:  Principal Problem:   Sepsis due to gram-negative UTI [facility]) Active Problems:   Dyslipidemia   Essential hypertension   Parkinson's disease ([place])   Sepsis ([place])  Sepsis, present on admission E Coli urinary tract infection Patient presenting with fevers, elevated white blood cell count of 14.7, tachycardia, and elevated lactic acid.  History of previous UTIs.  Urinalysis with large leukoesterase, negative nitrite, few bacteria, 21-50 WBCs.  Urine culture with E. coli with resistance to ampicillin and Bactrim, indeterminate to cefazolin. --Continued IV Levaquin 750 mg every 24 hours with clinical improvement. Afebrile -Will transition to PO levofloxacin -Given trial of pyridium for  dysuria  BPH Follows [facility].  Continue tamsulosin.   -Recommend continued follow up with Urology as scheduled.  Parkinson's disease --Continue home carbidopa-levodopa 25-100 mg p.o. 3 times daily   Discharge Instructions   Allergies as of 04/19/2019      Reactions   Erythromycin Nausea Only   REACTION: Intolerance to this medication with upset stomach   Nitroglycerin Other (See Comments)   REACTION: Intolerance to this medication.  It drops his BP too low and quickly.   Sulfa Antibiotics Nausea Only, Rash   Tetracyclines & Related Nausea Only   Amoxicillin Diarrhea, Nausea Only   Penicillins Swelling, Rash   Has patient had a PCN reaction causing immediate rash, facial/tongue/throat swelling, SOB or lightheadedness with hypotension: Yes Has patient had a PCN reaction causing severe rash involving mucus membranes or skin necrosis: No Has patient had a PCN reaction that required hospitalization: No Has patient had a PCN reaction occurring within the last 10 years: No If all of the above answers are "NO", then may proceed with Cephalosporin use. REACTION: Patient develops a rash and swelling at inject      Medication List    TAKE these medications   Carbidopa-Levodopa ER 25-100 MG tablet controlled release Commonly known as:  SINEMET CR Take 1 tablet by mouth 3 (three) times daily.   docusate sodium 100 MG capsule Commonly known as:  Colace Take 1 capsule (100 mg total) by mouth 2 (two) times daily as needed for mild constipation.   levofloxacin 500 MG tablet Commonly known as:  Levaquin Take 1 tablet (500 mg total) by mouth daily for 3 days.   multivitamin with minerals Tabs tablet Take 1 tablet by mouth daily.   phenazopyridine 100 MG tablet Commonly  known as:  PYRIDIUM Take 1 tablet (100 mg total) by mouth 3 (three) times daily with meals.   polyethylene glycol 17 g packet Commonly known as:  MiraLax Take 17 g by mouth daily as needed  for mild constipation.   tamsulosin 0.4 MG Caps capsule Commonly known as:  FLOMAX Take 0.4 mg by mouth daily.   vitamin B-12 500 MCG tablet Commonly known as:  CYANOCOBALAMIN Take 500 mcg by mouth daily.      Follow-up Information    Shon Baton, MD. Schedule an appointment as soon as possible for a visit in 2 week(s).   Specialty:  Internal Medicine Contact information: [place] [address] [phone]          Allergies  Allergen Reactions  . Erythromycin Nausea Only    REACTION: Intolerance to this medication with upset stomach  . Nitroglycerin Other (See Comments)    REACTION: Intolerance to this medication.  It drops his BP too low and quickly.  . Sulfa Antibiotics Nausea Only and Rash  . Tetracyclines & Related Nausea Only  . Amoxicillin Diarrhea and Nausea Only  . Penicillins Swelling and Rash    Has patient had a PCN reaction causing immediate rash, facial/tongue/throat swelling, SOB or lightheadedness with hypotension: Yes Has patient had a PCN reaction causing severe rash involving mucus membranes or skin necrosis: No Has patient had a PCN reaction that required hospitalization: No Has patient had a PCN reaction occurring within the last 10 years: No If all of the above answers are "NO", then may proceed with Cephalosporin use.   REACTION: Patient develops a rash and swelling at inject    Procedures/Studies: Dg Chest 2 View  Result Date: 04/17/2019 CLINICAL DATA:  85 year old male with fever. EXAM: CHEST - 2 VIEW COMPARISON:  Chest radiograph dated 05/26/2010 FINDINGS: Minimal left lung base densities, likely atelectatic changes. There is no focal consolidation, pleural effusion, or pneumothorax. The cardiac silhouette is within normal limits. Atherosclerotic calcification of the aortic arch. No acute osseous pathology. IMPRESSION: No active cardiopulmonary disease. Electronically Signed   By: Anner Crete M.D.   On: 04/17/2019 02:47      Subjective: Eager to go home  Discharge Exam: Vitals:   04/18/19 2132 04/19/19 0532  BP: (!) 141/72 (!) 154/67  Pulse: 78 73  Resp:    Temp: 97.8 F (36.6 C) 98.2 F (36.8 C)  SpO2: 95% 97%   Vitals:   04/18/19 1101 04/18/19 1302 04/18/19 2132 04/19/19 0532  BP: (!) 127/58 (!) 128/59 (!) 141/72 (!) 154/67  Pulse: 78 76 78 73  Resp: 18 16    Temp: 98.4 F (36.9 C) 98 F (36.7 C) 97.8 F (36.6 C) 98.2 F (36.8 C)  TempSrc: Oral Oral Oral Oral  SpO2: 98% 96% 95% 97%  Weight:      Height:        General: Pt is alert, awake, not in acute distress Cardiovascular: RRR, S1/S2 +, no rubs, no gallops Respiratory: CTA bilaterally, no wheezing, no rhonchi Abdominal: Soft, NT, ND, bowel sounds + Extremities: no edema, no cyanosis   The results of significant diagnostics from this hospitalization (including imaging, microbiology, ancillary and laboratory) are listed below for reference.     Microbiology: Recent Results (from the past 240 hour(s))  Blood Culture (routine x 2)     Status: None (Preliminary result)   Collection Time: 04/17/19  2:09 AM  Result Value Ref Range Status   Specimen Description BLOOD RIGHT HAND  Final   Special Requests   Final    BOTTLES DRAWN AEROBIC AND ANAEROBIC Blood Culture adequate volume   Culture   Final    NO GROWTH 2 DAYS Performed at [facility], [address], [place], [place] [address]    Report Status PENDING  Incomplete  Blood Culture (routine x 2)     Status: None (Preliminary result)   Collection Time: 04/17/19  5:09 AM  Result Value Ref Range Status   Specimen Description BLOOD RIGHT ANTECUBITAL  Final   Special Requests   Final    BOTTLES DRAWN AEROBIC AND ANAEROBIC Blood Culture adequate volume   Culture   Final    NO GROWTH 2 DAYS Performed at [facility], [place] [address] [place], [place] [address]    Report Status PENDING  Incomplete  Urine culture     Status: Abnormal   Collection Time: 04/17/19   5:35 AM  Result Value Ref Range Status   Specimen Description URINE, RANDOM  Final   Special Requests   Final    NONE Performed at [facility], [place] [address], [place], [place] [address]    Culture >=100,000 COLONIES/mL ESCHERICHIA COLI (A)  Final   Report Status 04/18/2019 FINAL  Final   Organism ID, Bacteria ESCHERICHIA COLI (A)  Final      Susceptibility   Escherichia coli - MIC*    AMPICILLIN >=32 RESISTANT Resistant     CEFAZOLIN <=4 SENSITIVE Sensitive     CEFTRIAXONE <=1 SENSITIVE Sensitive     CIPROFLOXACIN <=0.25 SENSITIVE Sensitive     GENTAMICIN >=16 RESISTANT Resistant     IMIPENEM <=0.25 SENSITIVE Sensitive     NITROFURANTOIN <=16 SENSITIVE Sensitive     TRIMETH/SULFA >=320 RESISTANT Resistant     AMPICILLIN/SULBACTAM 16 INTERMEDIATE Intermediate     PIP/TAZO <=4 SENSITIVE Sensitive     Extended ESBL NEGATIVE Sensitive     * >=100,000 COLONIES/mL ESCHERICHIA COLI  SARS Coronavirus 2 (CEPHEID- Performed in [place] hospital lab), Hosp Order     Status: None   Collection Time: 04/17/19  6:16 AM  Result Value Ref Range Status   SARS Coronavirus 2 NEGATIVE NEGATIVE Final    Comment: (NOTE) If result is NEGATIVE SARS-CoV-2 target nucleic acids are NOT DETECTED. The SARS-CoV-2 RNA is generally detectable in upper and lower  respiratory specimens during the acute phase of infection. The lowest  concentration of SARS-CoV-2 viral copies this assay can detect is 250  copies / mL. A negative result does not preclude SARS-CoV-2 infection  and should not be used as the sole basis for treatment or other  patient management decisions.  A negative result may occur with  improper specimen collection / handling, submission of specimen other  than nasopharyngeal swab, presence of viral mutation(s) within the  areas targeted by this assay, and inadequate number of viral copies  (<250 copies / mL). A negative result must be combined with clinical  observations, patient  history, and epidemiological information. If result is POSITIVE SARS-CoV-2 target nucleic acids are DETECTED. The SARS-CoV-2 RNA is generally detectable in upper and lower  respiratory specimens dur ing the acute phase of infection.  Positive  results are indicative of active infection with SARS-CoV-2.  Clinical  correlation with patient history and other diagnostic information is  necessary to determine patient infection status.  Positive results do  not rule out bacterial infection or co-infection with other viruses. If result is PRESUMPTIVE POSTIVE SARS-CoV-2 nucleic acids  MAY BE PRESENT.   A presumptive positive result was obtained on the submitted specimen  and confirmed on repeat testing.  While 2019 novel coronavirus  (SARS-CoV-2) nucleic acids may be present in the submitted sample  additional confirmatory testing may be necessary for epidemiological  and / or clinical management purposes  to differentiate between  SARS-CoV-2 and other Sarbecovirus currently known to infect humans.  If clinically indicated additional testing with an alternate test  methodology [phone]) is advised. The SARS-CoV-2 RNA is generally  detectable in upper and lower respiratory sp ecimens during the acute  phase of infection. The expected result is Negative. Fact Sheet for Patients:  StrictlyIdeas.no Fact Sheet for Healthcare Providers: BankingDealers.co.za This test is not yet approved or cleared by the [place] FDA and has been authorized for detection and/or diagnosis of SARS-CoV-2 by FDA under an Emergency Use Authorization (EUA).  This EUA will remain in effect (meaning this test can be used) for the duration of the COVID-19 declaration under Section 564(b)(1) of the Act, 21 U.S.C. section 360bbb-3(b)(1), unless the authorization is terminated or revoked sooner. Performed at [facility], [place] [address], [place], [place] [address]       Labs: BNP (last 3 results) No results for input(s): BNP in the last 8760 hours. Basic Metabolic Panel: Recent Labs  Lab 04/17/19 0211 04/18/19 0243 04/19/19 0303  NA 135 136 136  K 3.8 3.9 3.9  CL 103 106 105  CO2 20* 23 23  GLUCOSE 147* 117* 116*  BUN 15 9 12   CREATININE 1.06 0.97 0.89  CALCIUM 8.9 8.3* 8.3*   Liver Function Tests: Recent Labs  Lab 04/17/19 0211  AST 16  ALT 11  ALKPHOS 66  BILITOT 1.7*  PROT 6.8  ALBUMIN 3.6   No results for input(s): LIPASE, AMYLASE in the last 168 hours. No results for input(s): AMMONIA in the last 168 hours. CBC: Recent Labs  Lab 04/17/19 0211 04/18/19 0243 04/19/19 0303  WBC 14.7* 15.2* 10.9*  NEUTROABS 12.6*  --   --   HGB 14.4 12.2* 11.8*  HCT 43.8 37.2* 35.3*  MCV 91.6 92.3 91.0  PLT 104* 75* 78*   Cardiac Enzymes: No results for input(s): CKTOTAL, CKMB, CKMBINDEX, TROPONINI in the last 168 hours. BNP: Invalid input(s): POCBNP CBG: No results for input(s): GLUCAP in the last 168 hours. D-Dimer No results for input(s): DDIMER in the last 72 hours. Hgb A1c No results for input(s): HGBA1C in the last 72 hours. Lipid Profile No results for input(s): CHOL, HDL, LDLCALC, TRIG, CHOLHDL, LDLDIRECT in the last 72 hours. Thyroid function studies No results for input(s): TSH, T4TOTAL, T3FREE, THYROIDAB in the last 72 hours.  Invalid input(s): FREET3 Anemia work up No results for input(s): VITAMINB12, FOLATE, FERRITIN, TIBC, IRON, RETICCTPCT in the last 72 hours. Urinalysis    Component Value Date/Time   COLORURINE YELLOW 04/17/2019 0614   APPEARANCEUR HAZY (A) 04/17/2019 0614   LABSPEC 1.004 (L) 04/17/2019 0614   PHURINE 5.0 04/17/2019 0614   GLUCOSEU NEGATIVE 04/17/2019 0614   HGBUR SMALL (A) 04/17/2019 0614   BILIRUBINUR NEGATIVE 04/17/2019 0614   KETONESUR NEGATIVE 04/17/2019 0614   PROTEINUR NEGATIVE 04/17/2019 0614   UROBILINOGEN 0.2 05/26/2010 1229   NITRITE NEGATIVE 04/17/2019 0614   LEUKOCYTESUR  LARGE (A) 04/17/2019 0614   Sepsis Labs Invalid input(s): PROCALCITONIN,  WBC,  LACTICIDVEN Microbiology Recent Results (from the past 240 hour(s))  Blood Culture (routine x 2)     Status: None (Preliminary result)  Collection Time: 04/17/19  2:09 AM  Result Value Ref Range Status   Specimen Description BLOOD RIGHT HAND  Final   Special Requests   Final    BOTTLES DRAWN AEROBIC AND ANAEROBIC Blood Culture adequate volume   Culture   Final    NO GROWTH 2 DAYS Performed at [facility], [address], [place], [place] [address]    Report Status PENDING  Incomplete  Blood Culture (routine x 2)     Status: None (Preliminary result)   Collection Time: 04/17/19  5:09 AM  Result Value Ref Range Status   Specimen Description BLOOD RIGHT ANTECUBITAL  Final   Special Requests   Final    BOTTLES DRAWN AEROBIC AND ANAEROBIC Blood Culture adequate volume   Culture   Final    NO GROWTH 2 DAYS Performed at [facility], [place] [address], [place], [place] [address]    Report Status PENDING  Incomplete  Urine culture     Status: Abnormal   Collection Time: 04/17/19  5:35 AM  Result Value Ref Range Status   Specimen Description URINE, RANDOM  Final   Special Requests   Final    NONE Performed at [facility], [place] [address] [place], [place] [address]    Culture >=100,000 COLONIES/mL ESCHERICHIA COLI (A)  Final   Report Status 04/18/2019 FINAL  Final   Organism ID, Bacteria ESCHERICHIA COLI (A)  Final      Susceptibility   Escherichia coli - MIC*    AMPICILLIN >=32 RESISTANT Resistant     CEFAZOLIN <=4 SENSITIVE Sensitive     CEFTRIAXONE <=1 SENSITIVE Sensitive     CIPROFLOXACIN <=0.25 SENSITIVE Sensitive     GENTAMICIN >=16 RESISTANT Resistant     IMIPENEM <=0.25 SENSITIVE Sensitive     NITROFURANTOIN <=16 SENSITIVE Sensitive     TRIMETH/SULFA >=320 RESISTANT Resistant     AMPICILLIN/SULBACTAM 16 INTERMEDIATE Intermediate     PIP/TAZO <=4 SENSITIVE Sensitive      Extended ESBL NEGATIVE Sensitive     * >=100,000 COLONIES/mL ESCHERICHIA COLI  SARS Coronavirus 2 (CEPHEID- Performed in [place] hospital lab), Hosp Order     Status: None   Collection Time: 04/17/19  6:16 AM  Result Value Ref Range Status   SARS Coronavirus 2 NEGATIVE NEGATIVE Final    Comment: (NOTE) If result is NEGATIVE SARS-CoV-2 target nucleic acids are NOT DETECTED. The SARS-CoV-2 RNA is generally detectable in upper and lower  respiratory specimens during the acute phase of infection. The lowest  concentration of SARS-CoV-2 viral copies this assay can detect is 250  copies / mL. A negative result does not preclude SARS-CoV-2 infection  and should not be used as the sole basis for treatment or other  patient management decisions.  A negative result may occur with  improper specimen collection / handling, submission of specimen other  than nasopharyngeal swab, presence of viral mutation(s) within the  areas targeted by this assay, and inadequate number of viral copies  (<250 copies / mL). A negative result must be combined with clinical  observations, patient history, and epidemiological information. If result is POSITIVE SARS-CoV-2 target nucleic acids are DETECTED. The SARS-CoV-2 RNA is generally detectable in upper and lower  respiratory specimens dur ing the acute phase of infection.  Positive  results are indicative of active infection with SARS-CoV-2.  Clinical  correlation with patient history and other diagnostic information is  necessary to determine patient infection status.  Positive results do  not rule out bacterial infection or co-infection with other viruses. If result is PRESUMPTIVE POSTIVE SARS-CoV-2 nucleic acids MAY BE PRESENT.   A presumptive positive result was obtained on the submitted specimen  and confirmed on repeat testing.  While 2019 novel coronavirus  (SARS-CoV-2) nucleic acids may be present in the submitted sample  additional confirmatory  testing may be necessary for epidemiological  and / or clinical management purposes  to differentiate between  SARS-CoV-2 and other Sarbecovirus currently known to infect humans.  If clinically indicated additional testing with an alternate test  methodology [phone]) is advised. The SARS-CoV-2 RNA is generally  detectable in upper and lower respiratory sp ecimens during the acute  phase of infection. The expected result is Negative. Fact Sheet for Patients:  StrictlyIdeas.no Fact Sheet for Healthcare Providers: BankingDealers.co.za This test is not yet approved or cleared by the [place] FDA and has been authorized for detection and/or diagnosis of SARS-CoV-2 by FDA under an Emergency Use Authorization (EUA).  This EUA will remain in effect (meaning this test can be used) for the duration of the COVID-19 declaration under Section 564(b)(1) of the Act, 21 U.S.C. section 360bbb-3(b)(1), unless the authorization is terminated or revoked sooner. Performed at [facility], [place] [address], [place], [place] [address]    Time spent: 30 min  SIGNED:   Marylu Lund, MD  [facility] 04/19/2019, 11:07 AM  If 7PM-7AM, please contact night-coverage

## 2019-04-19 NOTE — ED Notes (Signed)
Tech took patient up.

## 2019-04-19 NOTE — ED Notes (Signed)
Patient has a gave permission that patients daughter can be called for additional information or updates, Jeremiah Zimmerman [phone].

## 2019-04-20 ENCOUNTER — Observation Stay (HOSPITAL_COMMUNITY): Payer: Medicare Other

## 2019-04-20 DIAGNOSIS — E785 Hyperlipidemia, unspecified: Secondary | ICD-10-CM | POA: Diagnosis present

## 2019-04-20 DIAGNOSIS — Z20828 Contact with and (suspected) exposure to other viral communicable diseases: Secondary | ICD-10-CM | POA: Diagnosis present

## 2019-04-20 DIAGNOSIS — C679 Malignant neoplasm of bladder, unspecified: Secondary | ICD-10-CM | POA: Diagnosis not present

## 2019-04-20 DIAGNOSIS — I341 Nonrheumatic mitral (valve) prolapse: Secondary | ICD-10-CM | POA: Diagnosis present

## 2019-04-20 DIAGNOSIS — G20 Parkinson's disease: Secondary | ICD-10-CM | POA: Diagnosis present

## 2019-04-20 DIAGNOSIS — Z906 Acquired absence of other parts of urinary tract: Secondary | ICD-10-CM | POA: Diagnosis not present

## 2019-04-20 DIAGNOSIS — N3001 Acute cystitis with hematuria: Secondary | ICD-10-CM | POA: Diagnosis present

## 2019-04-20 DIAGNOSIS — Z9049 Acquired absence of other specified parts of digestive tract: Secondary | ICD-10-CM | POA: Diagnosis not present

## 2019-04-20 DIAGNOSIS — I10 Essential (primary) hypertension: Secondary | ICD-10-CM | POA: Diagnosis present

## 2019-04-20 DIAGNOSIS — Z882 Allergy status to sulfonamides status: Secondary | ICD-10-CM | POA: Diagnosis not present

## 2019-04-20 DIAGNOSIS — A4151 Sepsis due to Escherichia coli [E. coli]: Secondary | ICD-10-CM | POA: Diagnosis present

## 2019-04-20 DIAGNOSIS — N390 Urinary tract infection, site not specified: Secondary | ICD-10-CM | POA: Diagnosis not present

## 2019-04-20 DIAGNOSIS — Z1629 Resistance to other single specified antibiotic: Secondary | ICD-10-CM | POA: Diagnosis present

## 2019-04-20 DIAGNOSIS — N401 Enlarged prostate with lower urinary tract symptoms: Secondary | ICD-10-CM | POA: Diagnosis present

## 2019-04-20 DIAGNOSIS — D696 Thrombocytopenia, unspecified: Secondary | ICD-10-CM | POA: Diagnosis present

## 2019-04-20 DIAGNOSIS — N320 Bladder-neck obstruction: Secondary | ICD-10-CM | POA: Diagnosis present

## 2019-04-20 DIAGNOSIS — N410 Acute prostatitis: Secondary | ICD-10-CM | POA: Diagnosis present

## 2019-04-20 DIAGNOSIS — R509 Fever, unspecified: Secondary | ICD-10-CM | POA: Diagnosis present

## 2019-04-20 DIAGNOSIS — Z1611 Resistance to penicillins: Secondary | ICD-10-CM | POA: Diagnosis present

## 2019-04-20 DIAGNOSIS — Z66 Do not resuscitate: Secondary | ICD-10-CM | POA: Diagnosis present

## 2019-04-20 DIAGNOSIS — R338 Other retention of urine: Secondary | ICD-10-CM | POA: Diagnosis present

## 2019-04-20 DIAGNOSIS — Z881 Allergy status to other antibiotic agents status: Secondary | ICD-10-CM | POA: Diagnosis not present

## 2019-04-20 DIAGNOSIS — Z888 Allergy status to other drugs, medicaments and biological substances status: Secondary | ICD-10-CM | POA: Diagnosis not present

## 2019-04-20 DIAGNOSIS — Z880 Allergy status to penicillin: Secondary | ICD-10-CM | POA: Diagnosis not present

## 2019-04-20 DIAGNOSIS — Z87440 Personal history of urinary (tract) infections: Secondary | ICD-10-CM | POA: Diagnosis not present

## 2019-04-20 DIAGNOSIS — Z8551 Personal history of malignant neoplasm of bladder: Secondary | ICD-10-CM | POA: Diagnosis not present

## 2019-04-20 DIAGNOSIS — K5909 Other constipation: Secondary | ICD-10-CM | POA: Diagnosis present

## 2019-04-20 LAB — COMPREHENSIVE METABOLIC PANEL
ALT: 23 U/L (ref 0–44)
AST: 21 U/L (ref 15–41)
Albumin: 2.7 g/dL — ABNORMAL LOW (ref 3.5–5.0)
Alkaline Phosphatase: 65 U/L (ref 38–126)
Anion gap: 6 (ref 5–15)
BUN: 10 mg/dL (ref 8–23)
CO2: 24 mmol/L (ref 22–32)
Calcium: 8 mg/dL — ABNORMAL LOW (ref 8.9–10.3)
Chloride: 107 mmol/L (ref 98–111)
Creatinine, Ser: 0.84 mg/dL (ref 0.61–1.24)
GFR calc Af Amer: >60 mL/min (ref >60)
GFR calc non Af Amer: >60 mL/min (ref >60)
Glucose, Bld: 118 mg/dL — ABNORMAL HIGH (ref 70–99)
Potassium: 3.5 mmol/L (ref 3.5–5.1)
Sodium: 137 mmol/L (ref 135–145)
Total Bilirubin: 0.7 mg/dL (ref 0.3–1.2)
Total Protein: 5.8 g/dL — ABNORMAL LOW (ref 6.5–8.1)

## 2019-04-20 LAB — CBC
HCT: 37.2 % — ABNORMAL LOW (ref 39.0–52.0)
Hemoglobin: 11.9 g/dL — ABNORMAL LOW (ref 13.0–17.0)
MCH: 30.4 pg (ref 26.0–34.0)
MCHC: 32 g/dL (ref 30.0–36.0)
MCV: 95.1 fL (ref 80.0–100.0)
Platelets: 96 10*3/uL — ABNORMAL LOW (ref 150–400)
RBC: 3.91 MIL/uL — ABNORMAL LOW (ref 4.22–5.81)
RDW: 13 % (ref 11.5–15.5)
WBC: 6.2 10*3/uL (ref 4.0–10.5)
nRBC: 0 % (ref 0.0–0.2)

## 2019-04-20 LAB — PROCALCITONIN: Procalcitonin: 0.2 ng/mL

## 2019-04-20 LAB — TSH: TSH: 2.385 u[IU]/mL (ref 0.350–4.500)

## 2019-04-20 LAB — PHOSPHORUS: Phosphorus: 3.3 mg/dL (ref 2.5–4.6)

## 2019-04-20 LAB — LACTIC ACID, PLASMA: Lactic Acid, Venous: 0.8 mmol/L (ref 0.5–1.9)

## 2019-04-20 LAB — MAGNESIUM: Magnesium: 1.7 mg/dL (ref 1.7–2.4)

## 2019-04-20 MED ORDER — ONDANSETRON HCL 4 MG PO TABS: 4.0000 mg | ORAL_TABLET | Freq: Four times a day (QID) | ORAL | Status: DC | PRN

## 2019-04-20 MED ORDER — MORPHINE SULFATE (PF) 2 MG/ML IV SOLN
2.0000 mg | INTRAVENOUS | Status: DC | PRN
  Administered 2019-04-20: 2 mg via INTRAVENOUS
  Filled 2019-04-20: qty 1

## 2019-04-20 MED ORDER — HYDROCODONE-ACETAMINOPHEN 5-325 MG PO TABS
1.0000 | ORAL_TABLET | ORAL | Status: DC | PRN
  Administered 2019-04-20 (×2): 2 via ORAL
  Administered 2019-04-21 – 2019-04-22 (×2): 1 via ORAL
  Filled 2019-04-20 (×2): qty 2
  Filled 2019-04-20 (×2): qty 1

## 2019-04-20 MED ORDER — POLYETHYLENE GLYCOL 3350 17 G PO PACK: 17.0000 g | PACK | Freq: Every day | ORAL | Status: DC | PRN

## 2019-04-20 MED ORDER — SODIUM CHLORIDE (PF) 0.9 % IJ SOLN
INTRAMUSCULAR | Status: AC
  Administered 2019-04-20: 09:00:00
  Filled 2019-04-20: qty 50

## 2019-04-20 MED ORDER — PHENAZOPYRIDINE HCL 100 MG PO TABS
100.0000 mg | ORAL_TABLET | Freq: Three times a day (TID) | ORAL | Status: DC
  Administered 2019-04-20 – 2019-04-22 (×8): 100 mg via ORAL
  Filled 2019-04-20 (×9): qty 1

## 2019-04-20 MED ORDER — CARBIDOPA-LEVODOPA ER 25-100 MG PO TBCR
1.0000 | EXTENDED_RELEASE_TABLET | Freq: Three times a day (TID) | ORAL | Status: DC
  Administered 2019-04-20 – 2019-04-22 (×8): 1 via ORAL
  Filled 2019-04-20 (×9): qty 1

## 2019-04-20 MED ORDER — DOCUSATE SODIUM 100 MG PO CAPS: 100.0000 mg | ORAL_CAPSULE | Freq: Two times a day (BID) | ORAL | Status: DC | PRN

## 2019-04-20 MED ORDER — ACETAMINOPHEN 650 MG RE SUPP: 650.0000 mg | Freq: Four times a day (QID) | RECTAL | Status: DC | PRN

## 2019-04-20 MED ORDER — TAMSULOSIN HCL 0.4 MG PO CAPS
0.4000 mg | ORAL_CAPSULE | Freq: Every day | ORAL | Status: DC
  Administered 2019-04-20 – 2019-04-22 (×3): 0.4 mg via ORAL
  Filled 2019-04-20 (×3): qty 1

## 2019-04-20 MED ORDER — SODIUM CHLORIDE 0.9 % IV SOLN
1.0000 g | INTRAVENOUS | Status: DC
  Administered 2019-04-20: 1 g via INTRAVENOUS
  Filled 2019-04-20: qty 10
  Filled 2019-04-20: qty 1

## 2019-04-20 MED ORDER — ONDANSETRON HCL 4 MG/2ML IJ SOLN
4.0000 mg | Freq: Four times a day (QID) | INTRAMUSCULAR | Status: DC | PRN
  Administered 2019-04-20: 4 mg via INTRAVENOUS
  Filled 2019-04-20: qty 2

## 2019-04-20 MED ORDER — ACETAMINOPHEN 325 MG PO TABS
650.0000 mg | ORAL_TABLET | Freq: Four times a day (QID) | ORAL | Status: DC | PRN
  Administered 2019-04-20 – 2019-04-21 (×3): 650 mg via ORAL
  Filled 2019-04-20 (×3): qty 2

## 2019-04-20 MED ORDER — IOHEXOL 300 MG/ML  SOLN
100.0000 mL | Freq: Once | INTRAMUSCULAR | Status: AC | PRN
  Administered 2019-04-20: 100 mL via INTRAVENOUS

## 2019-04-20 MED ORDER — SODIUM CHLORIDE 0.9 % IV SOLN
INTRAVENOUS | Status: AC
  Administered 2019-04-20: 01:00:00 via INTRAVENOUS

## 2019-04-20 NOTE — Consult Note (Signed)
Subjective: CC: Frequency.  Hx: Jeremiah Zimmerman is an 85 yo WM who I was asked to see by Dr. Roel Cluck for a febrile UTI.  Jeremiah Zimmerman is well known to me with a history of bladder cancer in 2010 and BPH with BOO with recurrent hematuria and a history of UTI's.  He was last seen in the office on 03/27/19 for cystoscopy that showed BPH but no recurrent tumors.  He had been on tamsulosin but finasteride was added for the prostatic bleeding.  He had pyuria at the time of cystoscopy but was on doxycycline for a tick bite and a culture that day was negative.   He was admitted with a fever of 105 on 6/1 and was found to have an e. Coli UTI that was treated with Levaquin.   He was discharged home yesterday afternoon, 04/19/19, but had a recurrent fever to 104 and severe frequency and urgency q48min with suprapubic pain.  His UA on admission is nitrite positive but he is on pyridium and there are >50 RBC's and WBC's.  His PVR on admission was 57ml.   He continues to have severe urgency and frequency with lower abdominal/suprapubic pain this morning.   ROS:  Review of Systems  Constitutional: Negative for fever.  Gastrointestinal: Positive for abdominal pain.  Genitourinary: Positive for frequency and urgency.  All other systems reviewed and are negative.   Allergies  Allergen Reactions  . Erythromycin Nausea Only    REACTION: Intolerance to this medication with upset stomach  . Nitroglycerin Other (See Comments)    REACTION: Intolerance to this medication.  It drops his BP too low and quickly.  . Sulfa Antibiotics Nausea Only and Rash  . Tetracyclines & Related Nausea Only  . Amoxicillin Diarrhea and Nausea Only  . Penicillins Swelling and Rash    Has patient had a PCN reaction causing immediate rash, facial/tongue/throat swelling, SOB or lightheadedness with hypotension: Yes Has patient had a PCN reaction causing severe rash involving mucus membranes or skin necrosis: No Has patient had a PCN reaction that  required hospitalization: No Has patient had a PCN reaction occurring within the last 10 years: No If all of the above answers are "NO", then may proceed with Cephalosporin use.   REACTION: Patient develops a rash and swelling at inject    Past Medical History:  Diagnosis Date  . Adenomatous colon polyp   . Bladder cancer ([place]) 2010  . BPH (benign prostatic hyperplasia)   . Heart murmur   . Hyperlipidemia   . Hypertension   . MVP (mitral valve prolapse) 01-13-13   hx. LBBB previous EKG 11'11  in Epic.  Marland Kitchen Parkinson's disease ([facility])   . SBO (small bowel obstruction) ([place]) 07/18/2018  . Seasonal allergies 01-13-13    Past Surgical History:  Procedure Laterality Date  . APPENDECTOMY    . BLADDER SURGERY  2010   bladder cancer in 2010/Post surgery bleed  . CARDIAC CATHETERIZATION  01-13-13   greater than 5 yrs ago-negative findings  . CHOLECYSTECTOMY N/A 01/27/2017   Procedure: LAPAROSCOPIC CHOLECYSTECTOMY WITH INTRAOPERATIVE CHOLANGIOGRAM;  Surgeon: Rolm Bookbinder, MD;  Location: [facility];  Service: General;  Laterality: N/A;  . COLONOSCOPY  01-13-13   hx.colon polyps excised-last 3-4 yrs ago  . ELBOW SURGERY     left  . KNEE ARTHROSCOPY WITH MEDIAL MENISECTOMY Right 01/20/2013   Procedure: KNEE ARTHROSCOPY WITH MEDIAL MENISECTOMY;  Surgeon: Gearlean Alf, MD;  Location: [facility];  Service: Orthopedics;  Laterality: Right;  right  knee arthroscopy with medial meniscal debridement and chrondroplasty  . TONSILLECTOMY    . TONSILLECTOMY AND ADENOIDECTOMY      Social History   Socioeconomic History  . Marital status: Married    Spouse name: Not on file  . Number of children: Not on file  . Years of education: Not on file  . Highest education level: Not on file  Occupational History  . Not on file  Social Needs  . Financial resource strain: Not on file  . Food insecurity:    Worry: Not on file    Inability: Not on file  . Transportation needs:    Medical: Not on file     Non-medical: Not on file  Tobacco Use  . Smoking status: Never Smoker  . Smokeless tobacco: Never Used  Substance and Sexual Activity  . Alcohol use: No  . Drug use: No  . Sexual activity: Not Currently  Lifestyle  . Physical activity:    Days per week: Not on file    Minutes per session: Not on file  . Stress: Not on file  Relationships  . Social connections:    Talks on phone: Not on file    Gets together: Not on file    Attends religious service: Not on file    Active member of club or organization: Not on file    Attends meetings of clubs or organizations: Not on file    Relationship status: Not on file  . Intimate partner violence:    Fear of current or ex partner: Not on file    Emotionally abused: Not on file    Physically abused: Not on file    Forced sexual activity: Not on file  Other Topics Concern  . Not on file  Social History Narrative  . Not on file    Family History  Problem Relation Age of Onset  . Breast cancer Mother   . Prostate cancer Father   . Colon cancer Brother   . Cervical cancer Daughter   . Thyroid cancer Daughter   . Uterine cancer Daughter   . Prostate cancer Brother   . Alzheimer's disease Sister   . Alzheimer's disease Sister     Anti-infectives: Anti-infectives (From admission, onward)   Start     Dose/Rate Route Frequency Ordered Stop   04/20/19 2000  cefTRIAXone (ROCEPHIN) 1 g in sodium chloride 0.9 % 100 mL IVPB     1 g 200 mL/hr over 30 Minutes Intravenous Every 24 hours 04/20/19 0011     04/19/19 2130  cefTRIAXone (ROCEPHIN) 2 g in sodium chloride 0.9 % 100 mL IVPB     2 g 200 mL/hr over 30 Minutes Intravenous  Once 04/19/19 2123 04/19/19 2225      Current Facility-Administered Medications  Medication Dose Route Frequency Provider Last Rate Last Dose  . 0.9 %  sodium chloride infusion   Intravenous Continuous Toy Baker, MD 75 mL/hr at 04/20/19 0400    . acetaminophen (TYLENOL) tablet 650 mg  650 mg Oral Q6H  PRN Toy Baker, MD   650 mg at 04/20/19 2706   Or  . acetaminophen (TYLENOL) suppository 650 mg  650 mg Rectal Q6H PRN Doutova, Anastassia, MD      . Carbidopa-Levodopa ER (SINEMET CR) 25-100 MG tablet controlled release 1 tablet  1 tablet Oral TID WC Doutova, Anastassia, MD      . cefTRIAXone (ROCEPHIN) 1 g in sodium chloride 0.9 % 100 mL IVPB  1 g Intravenous Q24H  Toy Baker, MD      . docusate sodium (COLACE) capsule 100 mg  100 mg Oral BID PRN Toy Baker, MD      . HYDROcodone-acetaminophen (NORCO/VICODIN) 5-325 MG per tablet 1-2 tablet  1-2 tablet Oral Q4H PRN Toy Baker, MD   2 tablet at 04/20/19 0036  . ondansetron (ZOFRAN) tablet 4 mg  4 mg Oral Q6H PRN Toy Baker, MD       Or  . ondansetron (ZOFRAN) injection 4 mg  4 mg Intravenous Q6H PRN Doutova, Anastassia, MD      . phenazopyridine (PYRIDIUM) tablet 100 mg  100 mg Oral TID WC Doutova, Anastassia, MD      . polyethylene glycol (MIRALAX / GLYCOLAX) packet 17 g  17 g Oral Daily PRN Doutova, Anastassia, MD      . tamsulosin (FLOMAX) capsule 0.4 mg  0.4 mg Oral Daily Doutova, Anastassia, MD         Objective: Vital signs in last 24 hours: Temp:  [98.1 F (36.7 C)-102.3 F (39.1 C)] 98.6 F (37 C) (06/04 0610) Pulse Rate:  [78-105] 105 (06/04 0610) Resp:  [17-23] 18 (06/04 0610) BP: (137-162)/(60-93) 162/93 (06/04 0610) SpO2:  [90 %-96 %] 90 % (06/04 0610) Weight:  [72.6 kg] 72.6 kg (06/03 1823)  Intake/Output from previous day: 06/03 0701 - 06/04 0700 In: 255.1 [I.V.:255.1] Out: -  Intake/Output this shift: No intake/output data recorded.   Physical Exam Vitals signs reviewed.  Constitutional:      Appearance: Normal appearance.  HENT:     Head: Normocephalic and atraumatic.  Neck:     Musculoskeletal: Normal range of motion and neck supple.  Cardiovascular:     Rate and Rhythm: Regular rhythm. Tachycardia present.  Pulmonary:     Effort: Pulmonary effort is normal.      Breath sounds: Normal breath sounds.  Abdominal:     General: Abdomen is flat.     Palpations: Abdomen is soft.     Tenderness: There is abdominal tenderness (suprapubic and LLQ). There is no right CVA tenderness or left CVA tenderness.  Genitourinary:    Penis: Normal.      Scrotum/Testes: Normal.     Comments: Prostate is 3+ enlarged, firm with some tenderness.   NST, rectal vault is empty.  Musculoskeletal: Normal range of motion.        General: No swelling or tenderness.  Skin:    General: Skin is warm and dry.  Neurological:     General: No focal deficit present.     Mental Status: He is alert and oriented to person, place, and time.  Psychiatric:        Mood and Affect: Mood normal.        Behavior: Behavior normal.     Lab Results:  Recent Labs    04/19/19 1911 04/20/19 0235  WBC 8.0 6.2  HGB 12.2* 11.9*  HCT 38.5* 37.2*  PLT 98* 96*   BMET Recent Labs    04/19/19 1911 04/20/19 0235  NA 136 137  K 3.6 3.5  CL 107 107  CO2 24 24  GLUCOSE 129* 118*  BUN 13 10  CREATININE 0.84 0.84  CALCIUM 8.2* 8.0*   PT/INR No results for input(s): LABPROT, INR in the last 72 hours. ABG No results for input(s): PHART, HCO3 in the last 72 hours.  Invalid input(s): PCO2, PO2  Studies/Results: Dg Abd 1 View  Result Date: 04/19/2019 CLINICAL DATA:  Abdominal distension.  Abdominal pain. EXAM: ABDOMEN -  1 VIEW COMPARISON:  07/18/2018 FINDINGS: There is mild gaseous distention of several loops of small bowel scattered throughout the abdomen. These measure up to approximately 2.6 cm. There is no pneumatosis. No free air. A metallic electronic device projects over the right upper quadrant and obscures portions of the abdomen. There is a moderate amount of stool throughout the colon. IMPRESSION: 1. Nonobstructive bowel gas pattern. 2. Moderate amount of stool in the colon. Electronically Signed   By: Constance Holster M.D.   On: 04/19/2019 23:35   Dg Chest Portable 1  View  Result Date: 04/19/2019 CLINICAL DATA:  85 year old male with recent diagnosis of UTI. EXAM: PORTABLE CHEST 1 VIEW COMPARISON:  Chest radiograph dated 04/17/2019 FINDINGS: Shallow inspiration. There is no focal consolidation, or pneumothorax. Blunting of the left costophrenic angle likely related to atelectasis although trace pleural effusion is not excluded. Top-normal cardiac silhouette. No acute osseous pathology. IMPRESSION: No focal consolidation. Electronically Signed   By: Anner Crete M.D.   On: 04/19/2019 20:03   I reviewed his CT scan from 9/19 that showed renal cysts and an enlarged prostate but no other GU concerns.  I have reviewed his labs, cultures, imaging, hospital notes and office notes.   Assessment: Febrile UTI with severe frequency, urgency and lower abdominal pain.     BPH with BOO and recurrent hematuria.    With readmission on culture appropriate antibiotics I think it would be worthwhile to rule out a prostatic abscess or other intraabdominal process that could be contributing to his condition.   CT AP with IV contrast should be considered.    I will also order the finasteride that doesn't appear to be included in his med list.    CC: Dr. Toy Baker.     Irine Seal 04/20/2019 [phone]

## 2019-04-20 NOTE — Progress Notes (Signed)
PT Cancellation Note  Patient Details Name: Jeremiah Zimmerman MRN: [id number] DOB: 10-Dec-1933   Cancelled Treatment:    Reason Eval/Treat Not Completed: Pain limiting ability to participate Pt premedicated and worked with OT.  Pt remains in pain per staff.  Will check back as schedule permits.   Kassadee Carawan,KATHrine E 04/20/2019, 11:10 AM Carmelia Bake, PT, DPT Acute Rehabilitation Services Office: [phone] Pager: [phone]

## 2019-04-20 NOTE — Progress Notes (Signed)
PT Cancellation Note  Patient Details Name: Jeremiah Zimmerman MRN: [id number] DOB: Mar 14, 1934   Cancelled Treatment:    Reason Eval/Treat Not Completed: Patient declined, no reason specified RN reports pt now has foley catheter and pain improved.  Pt reports he received pain meds and having issues with nausea however confirms pain improved.  Pt requests to rest today and check back tomorrow.   Desteni Piscopo,KATHrine E 04/20/2019, 3:35 PM Carmelia Bake, PT, DPT Acute Rehabilitation Services Office: [phone] Pager: [phone]

## 2019-04-20 NOTE — Evaluation (Signed)
Occupational Therapy Evaluation Patient Details Name: Jeremiah Zimmerman MRN: [id number] DOB: [birth year] Today's Date: 04/20/2019    History of Present Illness Jeremiah Zimmerman is a 85 y.o. male with history of SBO, hypertension, hyperlipidemia, Parkinson's, bladder cancer 2010 status post surgical resection, BPH presents to the ED for fever of 104.3 onset approximately 2 hours prior to arrival.  Associated with intense suprapubic abdominal pain, fullness, urinary urgency, hematuria.  Patient was admitted 6/1 to 6/3 for sepsis due to gram-negative UTI, urine culture grew E. coli with resistance to ampicillin and Bactrim, indeterminate to cefazolin.   Clinical Impression   Pt with decline in function and safety with ADLs and ADL mobility with decreased strength, balance ane endurance. Pt is limited by pain in bladder/abdoinal area. Pt given pain meds ~ 45 minutes before OT. PTA, pt lived at home with his wife independent with ADLs, driving and using cane and RW when he feels he needs to as he reports. Pt's daughter Olegario Shearer called on the phone during session and doesn't;t want pt to walk out in hallway or be OOB due to his agonizing pain. OT educated pt and his daughter on benefits of being OOB and risks of extensive time in the bed, however pt and his daughter continued to decline further activity. Pt would benefit from acute OT services to address impairments to maximize level of function and safety    Follow Up Recommendations  Home health OT    Equipment Recommendations  None recommended by OT    Recommendations for Other Services       Precautions / Restrictions Precautions Precautions: None Restrictions Weight Bearing Restrictions: No      Mobility Bed Mobility               General bed mobility comments: pt in restroom with NT upon arrival  Transfers Overall transfer level: Needs assistance Equipment used: Rolling walker (2 wheeled) Transfers: Sit to/from Stand Sit to Stand:  Supervision              Balance Overall balance assessment: Needs assistance Sitting-balance support: No upper extremity supported;Feet supported Sitting balance-Leahy Scale: Good     Standing balance support: Bilateral upper extremity supported Standing balance-Leahy Scale: Poor                             ADL either performed or assessed with clinical judgement   ADL Overall ADL's : Needs assistance/impaired                                             Vision Baseline Vision/History: Wears glasses Patient Visual Report: No change from baseline       Perception     Praxis      Pertinent Vitals/Pain Pain Assessment: 0-10 Pain Score: 10-Worst pain ever Pain Location: bladder/abdominal area Pain Descriptors / Indicators: Constant;Pressure;Throbbing;Shooting Pain Intervention(s): Limited activity within patient's tolerance;Monitored during session;Premedicated before session;Repositioned     Hand Dominance Right   Extremity/Trunk Assessment Upper Extremity Assessment Upper Extremity Assessment: Generalized weakness   Lower Extremity Assessment Lower Extremity Assessment: Defer to PT evaluation   Cervical / Trunk Assessment Cervical / Trunk Assessment: Normal   Communication Communication Communication: No difficulties   Cognition Arousal/Alertness: Awake/alert Behavior During Therapy: WFL for tasks assessed/performed Overall Cognitive Status: Within Functional Limits for tasks assessed  General Comments       Exercises     Shoulder Instructions      Home Living Family/patient expects to be discharged to:: Private residence Living Arrangements: Spouse/significant other   Type of Home: House Home Access: Stairs to enter [employer] of Steps: 1   Home Layout: One level     Bathroom Shower/Tub: [place]: Environmental consultant - 2  wheels;Cane - single point   Additional Comments: pt reports that he only uses RW if he feels he needs to and cane when out in community      Prior Functioning/Environment    Gait / Transfers Assistance Needed: pt reports that he only uses RW if he feels he needs to and cane when out in community ADL's / Homemaking Assistance Needed: pt reports that he was independent with ADLs and was driving            OT Problem List: Decreased activity tolerance;Decreased strength;Impaired balance (sitting and/or standing);Decreased knowledge of use of DME or AE      OT Treatment/Interventions: Self-care/ADL training;DME and/or AE instruction;Therapeutic activities;Patient/family education    OT Goals(Current goals can be found in the care plan section) Acute Rehab OT Goals Patient Stated Goal: to go home  OT Goal Formulation: With patient/family Time For Goal Achievement: 04/27/19 Potential to Achieve Goals: Good ADL Goals Pt Will Perform Grooming: with set-up;with supervision Pt Will Perform Lower Body Bathing: with min assist Pt Will Perform Lower Body Dressing: with mod assist Pt Will Transfer to Toilet: with supervision;ambulating;regular height toilet;grab bars Pt Will Perform Toileting - Clothing Manipulation and hygiene: with min guard assist  OT Frequency: Min 2X/week   Barriers to D/C:    no barriers       Co-evaluation              AM-PAC OT "6 Clicks" Daily Activity     Outcome Measure Help from another person eating meals?: None Help from another person taking care of personal grooming?: A Little Help from another person toileting, which includes using toliet, bedpan, or urinal?: A Little Help from another person bathing (including washing, rinsing, drying)?: A Little Help from another person to put on and taking off regular upper body clothing?: None Help from another person to put on and taking off regular lower body clothing?: A Little 6 Click Score: 20   End  of Session Equipment Utilized During Treatment: Rolling walker  Activity Tolerance: Patient limited by pain Patient left: in chair;with call bell/phone within reach;with chair alarm set  OT Visit Diagnosis: Muscle weakness (generalized) (M62.81);Pain Pain - part of body: (abdomen/bladder)                Time: 7048-8891 OT Time Calculation (min): 27 min Charges:  OT General Charges $OT Visit: 1 Visit OT Evaluation $OT Eval Moderate Complexity: 1 Mod    Britt Bottom 04/20/2019, 11:42 AM

## 2019-04-20 NOTE — Progress Notes (Signed)
I have reviewed his CT films.  He has stable bilateral renal cysts but no stones or obstruction.    His prostate is enlarged and inhomogenous but no obvious prostatic abscess is seen.   There may be some increase in the AP diameter of the prostate with some decreased perfusion anteriorly compared to his study in 9/19 which could suggest prostatitis.  I think he should be treated as if he has acute prostatitis with at least a 2 week course of antibiotic therapy.

## 2019-04-20 NOTE — Progress Notes (Addendum)
PROGRESS NOTE  Jeremiah Zimmerman NTI:[id number] DOB: Mar 24, 1934 DOA: 04/19/2019 PCP: Jeremiah Baton, MD   LOS: 0 days   Brief narrative: Jeremiah Zimmerman a 85 y.o.malewith medical history significant ofSBO; HTN; HLD;[name];and bladder cancer (2010) presented to the hospital with complaints of fever as high as 104 F.  Patient did have worsening tremors from his Parkinson's disease.  He also had dysuria for 1 week.Numerous allergies to ABx so got started on levaquin for presumed urinary source. CXR negative.   Subjective: Patient complains of severe colicky pain on the suprapubic area on the background of dull persistent pain.  Seen by urology this morning.  He does have dribbling urination.  No fever, chills or rigors.  Assessment/Plan:  Principal Problem:   Acute lower UTI Active Problems:   Malignant neoplasm of bladder (HCC)   Dyslipidemia   Essential hypertension   Sepsis due to gram-negative UTI (HCC)   Parkinson's disease ([place])   UTI (urinary tract infection)  Sepsis secondary to E. coli UTI,, present on admission  Urine culture with E. coli with resistance to ampicillin and Bactrim, indeterminate to cefazolin.  Currently on Levaquin.  We will continue for now.  Consider changing to oral on discharge.  Follow-up blood cultures.  T-max of 102.3 F yesterday evening at 8 PM.  Lactate improved with IV fluid resuscitation.  No leukocytosis.  Repeat urine cultures have been sent.  CT scan of the abdomen pelvis showed stable renal cyst but no stones or obstruction.  No prostatic abscess was noted  BPH Patient complains of persistent pain with colicky component.  Spoke with urology about it.  Patient is already on tamsulosin.  We will continue with that.  Will bladder scan.  If significant urinary retention will place in a Foley catheter.  Continue antibiotics for now.  We will add IV morphine for severe pain which he describes as 10 x 10.  Continue  Pyridium.  Parkinson's disease On carbidopa-levodopa 25-100 mg p.o. 3 times daily  Mild thrombocytopenia.  Will closely monitor.  No evidence of bleeding at this time.  VTE Prophylaxis: Lovenox  Code Status: DNR  Family Communication: None  Disposition Plan: Home health.  We will change the patient's status to inpatient at this time due to sepsis secondary to UTI with fever persistent suprapubic/prostatic pain needing IV narcotics..  Consultants:  Urology  Procedures:  None  Antibiotics: Anti-infectives (From admission, onward)   Start     Dose/Rate Route Frequency Ordered Stop   04/20/19 2000  cefTRIAXone (ROCEPHIN) 1 g in sodium chloride 0.9 % 100 mL IVPB     1 g 200 mL/hr over 30 Minutes Intravenous Every 24 hours 04/20/19 0011     04/19/19 2130  cefTRIAXone (ROCEPHIN) 2 g in sodium chloride 0.9 % 100 mL IVPB     2 g 200 mL/hr over 30 Minutes Intravenous  Once 04/19/19 2123 04/19/19 2225       Objective: Vitals:   04/20/19 0014 04/20/19 0610  BP: (!) 145/61 (!) 162/93  Pulse: 78 (!) 105  Resp: 18 18  Temp: 98.6 F (37 C) 98.6 F (37 C)  SpO2: 96% 90%    Intake/Output Summary (Last 24 hours) at 04/20/2019 1256 Last data filed at 04/20/2019 0931 Gross per 24 hour  Intake 255.05 ml  Output 125 ml  Net 130.05 ml   Filed Weights   04/19/19 1823  Weight: 72.6 kg   Body mass index is 22.96 kg/m.   Physical Exam: GENERAL:  Patient is alert awake and oriented. In moderate distress due to pain. HENT: No scleral pallor or icterus. Pupils equally reactive to light. Oral mucosa is moist NECK: is supple, no palpable thyroid enlargement. CHEST: Clear to auscultation. No crackles or wheezes. Non tender on palpation. Diminished breath sounds bilaterally. CVS: S1 and S2 heard, no murmur. Regular rate and rhythm. No pericardial rub. ABDOMEN: Soft, suprapubic tenderness noted. bowel sounds are present. No palpable hepato-splenomegaly. EXTREMITIES: No edema. CNS:  Cranial nerves are intact. No focal motor or sensory deficits. SKIN: warm and dry without rashes.  Data Review: I have personally reviewed the following laboratory data and studies,  CBC: Recent Labs  Lab 04/17/19 0211 04/18/19 0243 04/19/19 0303 04/19/19 1911 04/20/19 0235  WBC 14.7* 15.2* 10.9* 8.0 6.2  NEUTROABS 12.6*  --   --  6.6  --   HGB 14.4 12.2* 11.8* 12.2* 11.9*  HCT 43.8 37.2* 35.3* 38.5* 37.2*  MCV 91.6 92.3 91.0 92.8 95.1  PLT 104* 75* 78* 98* 96*   Basic Metabolic Panel: Recent Labs  Lab 04/17/19 0211 04/18/19 0243 04/19/19 0303 04/19/19 1911 04/20/19 0235  NA 135 136 136 136 137  K 3.8 3.9 3.9 3.6 3.5  CL 103 106 105 107 107  CO2 20* 23 23 24 24   GLUCOSE 147* 117* 116* 129* 118*  BUN 15 9 12 13 10   CREATININE 1.06 0.97 0.89 0.84 0.84  CALCIUM 8.9 8.3* 8.3* 8.2* 8.0*  MG  --   --   --   --  1.7  PHOS  --   --   --   --  3.3   Liver Function Tests: Recent Labs  Lab 04/17/19 0211 04/20/19 0235  AST 16 21  ALT 11 23  ALKPHOS 66 65  BILITOT 1.7* 0.7  PROT 6.8 5.8*  ALBUMIN 3.6 2.7*   No results for input(s): LIPASE, AMYLASE in the last 168 hours. No results for input(s): AMMONIA in the last 168 hours. Cardiac Enzymes: No results for input(s): CKTOTAL, CKMB, CKMBINDEX, TROPONINI in the last 168 hours. BNP (last 3 results) No results for input(s): BNP in the last 8760 hours.  ProBNP (last 3 results) No results for input(s): PROBNP in the last 8760 hours.  CBG: No results for input(s): GLUCAP in the last 168 hours. Recent Results (from the past 240 hour(s))  Blood Culture (routine x 2)     Status: None (Preliminary result)   Collection Time: 04/17/19  2:09 AM  Result Value Ref Range Status   Specimen Description BLOOD RIGHT HAND  Final   Special Requests   Final    BOTTLES DRAWN AEROBIC AND ANAEROBIC Blood Culture adequate volume   Culture   Final    NO GROWTH 3 DAYS Performed at [facility], [address] [place], [place]  [address]    Report Status PENDING  Incomplete  Blood Culture (routine x 2)     Status: None (Preliminary result)   Collection Time: 04/17/19  5:09 AM  Result Value Ref Range Status   Specimen Description BLOOD RIGHT ANTECUBITAL  Final   Special Requests   Final    BOTTLES DRAWN AEROBIC AND ANAEROBIC Blood Culture adequate volume   Culture   Final    NO GROWTH 3 DAYS Performed at [facility], [place] [address], [place], [place] [address]    Report Status PENDING  Incomplete  Urine culture     Status: Abnormal   Collection Time: 04/17/19  5:35 AM  Result Value Ref Range Status   Specimen Description URINE, RANDOM  Final   Special Requests   Final    NONE Performed at [facility], [address] [place], [place] [address]    Culture >=100,000 COLONIES/mL ESCHERICHIA COLI (A)  Final   Report Status 04/18/2019 FINAL  Final   Organism ID, Bacteria ESCHERICHIA COLI (A)  Final      Susceptibility   Escherichia coli - MIC*    AMPICILLIN >=32 RESISTANT Resistant     CEFAZOLIN <=4 SENSITIVE Sensitive     CEFTRIAXONE <=1 SENSITIVE Sensitive     CIPROFLOXACIN <=0.25 SENSITIVE Sensitive     GENTAMICIN >=16 RESISTANT Resistant     IMIPENEM <=0.25 SENSITIVE Sensitive     NITROFURANTOIN <=16 SENSITIVE Sensitive     TRIMETH/SULFA >=320 RESISTANT Resistant     AMPICILLIN/SULBACTAM 16 INTERMEDIATE Intermediate     PIP/TAZO <=4 SENSITIVE Sensitive     Extended ESBL NEGATIVE Sensitive     * >=100,000 COLONIES/mL ESCHERICHIA COLI  SARS Coronavirus 2 (CEPHEID- Performed in [place] hospital lab), Hosp Order     Status: None   Collection Time: 04/17/19  6:16 AM  Result Value Ref Range Status   SARS Coronavirus 2 NEGATIVE NEGATIVE Final    Comment: (NOTE) If result is NEGATIVE SARS-CoV-2 target nucleic acids are NOT DETECTED. The SARS-CoV-2 RNA is generally detectable in upper and lower  respiratory specimens during the acute phase of infection. The lowest  concentration of  SARS-CoV-2 viral copies this assay can detect is 250  copies / mL. A negative result does not preclude SARS-CoV-2 infection  and should not be used as the sole basis for treatment or other  patient management decisions.  A negative result may occur with  improper specimen collection / handling, submission of specimen other  than nasopharyngeal swab, presence of viral mutation(s) within the  areas targeted by this assay, and inadequate number of viral copies  (<250 copies / mL). A negative result must be combined with clinical  observations, patient history, and epidemiological information. If result is POSITIVE SARS-CoV-2 target nucleic acids are DETECTED. The SARS-CoV-2 RNA is generally detectable in upper and lower  respiratory specimens dur ing the acute phase of infection.  Positive  results are indicative of active infection with SARS-CoV-2.  Clinical  correlation with patient history and other diagnostic information is  necessary to determine patient infection status.  Positive results do  not rule out bacterial infection or co-infection with other viruses. If result is PRESUMPTIVE POSTIVE SARS-CoV-2 nucleic acids MAY BE PRESENT.   A presumptive positive result was obtained on the submitted specimen  and confirmed on repeat testing.  While 2019 novel coronavirus  (SARS-CoV-2) nucleic acids may be present in the submitted sample  additional confirmatory testing may be necessary for epidemiological  and / or clinical management purposes  to differentiate between  SARS-CoV-2 and other Sarbecovirus currently known to infect humans.  If clinically indicated additional testing with an alternate test  methodology [phone]) is advised. The SARS-CoV-2 RNA is generally  detectable in upper and lower respiratory sp ecimens during the acute  phase of infection. The expected result is Negative. Fact Sheet for Patients:  StrictlyIdeas.no Fact Sheet for Healthcare  Providers: BankingDealers.co.za This test is not yet approved or cleared by the [place] FDA and has been authorized for detection and/or diagnosis of SARS-CoV-2 by FDA under an Emergency Use Authorization (EUA).  This EUA will remain in effect (meaning this test can  be used) for the duration of the COVID-19 declaration under Section 564(b)(1) of the Act, 21 U.S.C. section 360bbb-3(b)(1), unless the authorization is terminated or revoked sooner. Performed at [facility], [place] [address], [place], [place] [address]   SARS Coronavirus 2 (CEPHEID - Performed in [place] hospital lab), Hosp Order     Status: None   Collection Time: 04/19/19  7:12 PM  Result Value Ref Range Status   SARS Coronavirus 2 NEGATIVE NEGATIVE Final    Comment: (NOTE) If result is NEGATIVE SARS-CoV-2 target nucleic acids are NOT DETECTED. The SARS-CoV-2 RNA is generally detectable in upper and lower  respiratory specimens during the acute phase of infection. The lowest  concentration of SARS-CoV-2 viral copies this assay can detect is 250  copies / mL. A negative result does not preclude SARS-CoV-2 infection  and should not be used as the sole basis for treatment or other  patient management decisions.  A negative result may occur with  improper specimen collection / handling, submission of specimen other  than nasopharyngeal swab, presence of viral mutation(s) within the  areas targeted by this assay, and inadequate number of viral copies  (<250 copies / mL). A negative result must be combined with clinical  observations, patient history, and epidemiological information. If result is POSITIVE SARS-CoV-2 target nucleic acids are DETECTED. The SARS-CoV-2 RNA is generally detectable in upper and lower  respiratory specimens dur ing the acute phase of infection.  Positive  results are indicative of active infection with SARS-CoV-2.  Clinical  correlation with patient history and  other diagnostic information is  necessary to determine patient infection status.  Positive results do  not rule out bacterial infection or co-infection with other viruses. If result is PRESUMPTIVE POSTIVE SARS-CoV-2 nucleic acids MAY BE PRESENT.   A presumptive positive result was obtained on the submitted specimen  and confirmed on repeat testing.  While 2019 novel coronavirus  (SARS-CoV-2) nucleic acids may be present in the submitted sample  additional confirmatory testing may be necessary for epidemiological  and / or clinical management purposes  to differentiate between  SARS-CoV-2 and other Sarbecovirus currently known to infect humans.  If clinically indicated additional testing with an alternate test  methodology [phone]) is advised. The SARS-CoV-2 RNA is generally  detectable in upper and lower respiratory sp ecimens during the acute  phase of infection. The expected result is Negative. Fact Sheet for Patients:  StrictlyIdeas.no Fact Sheet for Healthcare Providers: BankingDealers.co.za This test is not yet approved or cleared by the [place] FDA and has been authorized for detection and/or diagnosis of SARS-CoV-2 by FDA under an Emergency Use Authorization (EUA).  This EUA will remain in effect (meaning this test can be used) for the duration of the COVID-19 declaration under Section 564(b)(1) of the Act, 21 U.S.C. section 360bbb-3(b)(1), unless the authorization is terminated or revoked sooner. Performed at [facility], [place] [address], [place], [place] [address]      Studies: Dg Abd 1 View  Result Date: 04/19/2019 CLINICAL DATA:  Abdominal distension.  Abdominal pain. EXAM: ABDOMEN - 1 VIEW COMPARISON:  07/18/2018 FINDINGS: There is mild gaseous distention of several loops of small bowel scattered throughout the abdomen. These measure up to approximately 2.6 cm. There is no pneumatosis. No free air.  A metallic electronic device projects over the right upper quadrant and obscures portions of the abdomen. There is a moderate amount of stool throughout the colon. IMPRESSION: 1. Nonobstructive bowel gas pattern.  2. Moderate amount of stool in the colon. Electronically Signed   By: Constance Holster M.D.   On: 04/19/2019 23:35   Ct Abdomen Pelvis W Contrast  Result Date: 04/20/2019 CLINICAL DATA:  85 year old male with history of bladder cancer diagnosed in 2010 presenting with recurrent hematuria. History of multiple urinary tract infections. EXAM: CT ABDOMEN AND PELVIS WITH CONTRAST TECHNIQUE: Multidetector CT imaging of the abdomen and pelvis was performed using the standard protocol following bolus administration of intravenous contrast. CONTRAST:  112mL OMNIPAQUE IOHEXOL 300 MG/ML  SOLN COMPARISON:  CT of the abdomen and pelvis 08/14/2018. FINDINGS: Lower chest: Mild scarring in the dependent portions of the lower lobes of the lungs bilaterally. Aortic atherosclerosis. Hepatobiliary: No suspicious cystic or solid hepatic lesions. No intra or extrahepatic biliary ductal dilatation. Status post cholecystectomy. Pancreas: No pancreatic mass. No pancreatic ductal dilatation. No pancreatic or peripancreatic fluid or inflammatory changes. Spleen: Unremarkable. Adrenals/Urinary Tract: Multiple low-attenuation lesions in the kidneys, compatible with simple cysts, largest of which measures 4.7 cm in the upper pole. Multiple small low-attenuation lesions in the parapelvic regions bilaterally, compatible with peripelvic cysts. No definite suspicious renal lesions. No hydroureteronephrosis. Urinary bladder is unremarkable in appearance. Bilateral adrenal glands are normal in appearance. Stomach/Bowel: Normal appearance of the stomach. No pathologic dilatation of small bowel or colon. The appendix is not confidently identified and may be surgically absent. Regardless, there are no inflammatory changes noted adjacent to  the cecum to suggest the presence of an acute appendicitis at this time. Vascular/Lymphatic: Aortic atherosclerosis, without evidence of aneurysm or dissection in the abdominal or pelvic vasculature. Prominent but nonenlarged posterolateral right-sided mesorectal lymph node measuring 8 mm (nonspecific). No lymphadenopathy noted in the abdomen or pelvis. Reproductive: Prostate gland is severely enlarged and heterogeneous in appearance with median lobe hypertrophy, measuring 7.7 x 6.3 x 8.6 cm. Seminal vesicles are unremarkable in appearance. Other: No significant volume of ascites.  No pneumoperitoneum. Musculoskeletal: There are no aggressive appearing lytic or blastic lesions noted in the visualized portions of the skeleton. IMPRESSION: 1. No findings to account for the patient's history of hematuria. Specifically, no urinary tract calculi. 2. Severe prostatomegaly with median lobe hypertrophy. 3. Multiple low-attenuation lesions in both kidneys, compatible with a combination of simple cysts and parapelvic cysts. 4. Aortic atherosclerosis. 5. Additional incidental findings, as above. Electronically Signed   By: Vinnie Langton M.D.   On: 04/20/2019 09:37   Dg Chest Portable 1 View  Result Date: 04/19/2019 CLINICAL DATA:  85 year old male with recent diagnosis of UTI. EXAM: PORTABLE CHEST 1 VIEW COMPARISON:  Chest radiograph dated 04/17/2019 FINDINGS: Shallow inspiration. There is no focal consolidation, or pneumothorax. Blunting of the left costophrenic angle likely related to atelectasis although trace pleural effusion is not excluded. Top-normal cardiac silhouette. No acute osseous pathology. IMPRESSION: No focal consolidation. Electronically Signed   By: Anner Crete M.D.   On: 04/19/2019 20:03    Scheduled Meds: . Carbidopa-Levodopa ER  1 tablet Oral TID WC  . phenazopyridine  100 mg Oral TID WC  . tamsulosin  0.4 mg Oral Daily    Continuous Infusions: . cefTRIAXone (ROCEPHIN)  IV        Flora Lipps, MD  [facility] 04/20/2019

## 2019-04-21 LAB — URINE CULTURE: Culture: <10000 — AB

## 2019-04-21 MED ORDER — CIPROFLOXACIN HCL 500 MG PO TABS
500.0000 mg | ORAL_TABLET | Freq: Two times a day (BID) | ORAL | Status: DC
  Administered 2019-04-21 – 2019-04-22 (×2): 500 mg via ORAL
  Filled 2019-04-21 (×2): qty 1

## 2019-04-21 MED ORDER — BELLADONNA ALKALOIDS-OPIUM 16.2-60 MG RE SUPP
1.0000 | Freq: Three times a day (TID) | RECTAL | Status: DC | PRN
  Administered 2019-04-21: 1 via RECTAL
  Filled 2019-04-21: qty 1

## 2019-04-21 NOTE — Progress Notes (Signed)
PROGRESS NOTE    Jeremiah Zimmerman  NLZ:[id number] DOB: 05-07-1934 DOA: 04/19/2019 PCP: Shon Baton, MD     Brief Narrative:  Jeremiah Zimmerman a 85 y.o.malewith medical history significant ofSBO; HTN; HLD;[name];and bladder cancer (2010) presented to the hospital with complaints of fever as high as 104 F.  Patient did have worsening tremors from his Parkinson's disease.  He also had dysuria for 1 week.Numerous allergies to ABx so got started on levaquin for presumed urinary source. CXR negative.   Urology was also consulted.  New events last 24 hours / Subjective: Patient states that he does not feel back to baseline, continues to be very weak.  He declined working with PT yesterday.  I encouraged him to work with physical therapy in order to gain her strength back so he can be discharged  Assessment & Plan:   Principal Problem:   Acute lower UTI Active Problems:   Malignant neoplasm of bladder ([place])   Dyslipidemia   Essential hypertension   Sepsis due to gram-negative UTI ([place])   Parkinson's disease ([place])   UTI (urinary tract infection)   Sepsis secondary to E. coli UTI, likely acute prostatitis, present on admission -Urine culture with E. coli with resistance to ampicillin and Bactrim,indeterminate to cefazolin.  Currently on Levaquin.    De-escalate to p.o. Cipro.   -Blood culture negative to date -Repeat urine culture with no growth -Appreciate urology, patient will need follow-up with Dr. Jeffie Pollock for voiding trial in 1 to 2 weeks  BPH -Appreciate urology, patient will need follow-up with Dr. Jeffie Pollock for voiding trial in 1 to 2 weeks. Plan to discharge with foley in place   Parkinson's disease -On carbidopa-levodopa  Mild thrombocytopenia -Will closely monitor.  No evidence of bleeding at this time.    DVT prophylaxis: SCD in setting of thrombocytopenia  Code Status: DNR Family Communication: None Disposition Plan: Discharge to  home with home health likely, PT evaluation still pending   Consultants:   Urology  Procedures:   None  Antimicrobials:  Anti-infectives (From admission, onward)   Start     Dose/Rate Route Frequency Ordered Stop   04/20/19 2000  cefTRIAXone (ROCEPHIN) 1 g in sodium chloride 0.9 % 100 mL IVPB     1 g 200 mL/hr over 30 Minutes Intravenous Every 24 hours 04/20/19 0011     04/19/19 2130  cefTRIAXone (ROCEPHIN) 2 g in sodium chloride 0.9 % 100 mL IVPB     2 g 200 mL/hr over 30 Minutes Intravenous  Once 04/19/19 2123 04/19/19 2225        Objective: Vitals:   04/20/19 2018 04/20/19 2119 04/21/19 0038 04/21/19 0409  BP: (!) 168/70 (!) 159/67 (!) 132/58 (!) 164/85  Pulse: 87  82 83  Resp: 18  16 18   Temp: 99.1 F (37.3 C)  99.8 F (37.7 C) 99.8 F (37.7 C)  TempSrc: Oral  Axillary Oral  SpO2: 93%  93% 93%  Weight:      Height:        Intake/Output Summary (Last 24 hours) at 04/21/2019 1238 Last data filed at 04/21/2019 0000 Gross per 24 hour  Intake 520 ml  Output 1725 ml  Net -1205 ml   Filed Weights   04/19/19 1823  Weight: 72.6 kg    Examination:  General exam: Appears calm and comfortable  Respiratory system: Clear to auscultation. Respiratory effort normal. Cardiovascular system: S1 & S2 heard, RRR. No JVD, murmurs, rubs, gallops or clicks. No pedal  edema. Gastrointestinal system: Abdomen is nondistended, soft and nontender. No organomegaly or masses felt. Normal bowel sounds heard. Central nervous system: Alert. No focal neurological deficits. Extremities: Symmetric 5 x 5 power. Skin: No rashes, lesions or ulcers Psychiatry: Judgement and insight appear stable   Data Reviewed: I have personally reviewed following labs and imaging studies  CBC: Recent Labs  Lab 04/17/19 0211 04/18/19 0243 04/19/19 0303 04/19/19 1911 04/20/19 0235  WBC 14.7* 15.2* 10.9* 8.0 6.2  NEUTROABS 12.6*  --   --  6.6  --   HGB 14.4 12.2* 11.8* 12.2* 11.9*  HCT 43.8 37.2*  35.3* 38.5* 37.2*  MCV 91.6 92.3 91.0 92.8 95.1  PLT 104* 75* 78* 98* 96*   Basic Metabolic Panel: Recent Labs  Lab 04/17/19 0211 04/18/19 0243 04/19/19 0303 04/19/19 1911 04/20/19 0235  NA 135 136 136 136 137  K 3.8 3.9 3.9 3.6 3.5  CL 103 106 105 107 107  CO2 20* 23 23 24 24   GLUCOSE 147* 117* 116* 129* 118*  BUN 15 9 12 13 10   CREATININE 1.06 0.97 0.89 0.84 0.84  CALCIUM 8.9 8.3* 8.3* 8.2* 8.0*  MG  --   --   --   --  1.7  PHOS  --   --   --   --  3.3   GFR: Estimated Creatinine Clearance: 68.4 mL/min (by C-G formula based on SCr of 0.84 mg/dL). Liver Function Tests: Recent Labs  Lab 04/17/19 0211 04/20/19 0235  AST 16 21  ALT 11 23  ALKPHOS 66 65  BILITOT 1.7* 0.7  PROT 6.8 5.8*  ALBUMIN 3.6 2.7*   No results for input(s): LIPASE, AMYLASE in the last 168 hours. No results for input(s): AMMONIA in the last 168 hours. Coagulation Profile: No results for input(s): INR, PROTIME in the last 168 hours. Cardiac Enzymes: No results for input(s): CKTOTAL, CKMB, CKMBINDEX, TROPONINI in the last 168 hours. BNP (last 3 results) No results for input(s): PROBNP in the last 8760 hours. HbA1C: No results for input(s): HGBA1C in the last 72 hours. CBG: No results for input(s): GLUCAP in the last 168 hours. Lipid Profile: No results for input(s): CHOL, HDL, LDLCALC, TRIG, CHOLHDL, LDLDIRECT in the last 72 hours. Thyroid Function Tests: Recent Labs    04/20/19 0235  TSH 2.385   Anemia Panel: No results for input(s): VITAMINB12, FOLATE, FERRITIN, TIBC, IRON, RETICCTPCT in the last 72 hours. Sepsis Labs: Recent Labs  Lab 04/17/19 0211 04/17/19 0417 04/19/19 1912 04/20/19 0235  PROCALCITON <0.10  --   --  0.20  LATICACIDVEN 2.2* 1.9 1.0 0.8    Recent Results (from the past 240 hour(s))  Blood Culture (routine x 2)     Status: None (Preliminary result)   Collection Time: 04/17/19  2:09 AM  Result Value Ref Range Status   Specimen Description BLOOD RIGHT HAND   Final   Special Requests   Final    BOTTLES DRAWN AEROBIC AND ANAEROBIC Blood Culture adequate volume   Culture   Final    NO GROWTH 4 DAYS Performed at [facility], [place] [address] [place], [place] [address]    Report Status PENDING  Incomplete  Blood Culture (routine x 2)     Status: None (Preliminary result)   Collection Time: 04/17/19  5:09 AM  Result Value Ref Range Status   Specimen Description BLOOD RIGHT ANTECUBITAL  Final   Special Requests   Final    BOTTLES DRAWN AEROBIC AND ANAEROBIC Blood Culture  adequate volume   Culture   Final    NO GROWTH 4 DAYS Performed at [facility], [place] [address] [place], [place] [address]    Report Status PENDING  Incomplete  Urine culture     Status: Abnormal   Collection Time: 04/17/19  5:35 AM  Result Value Ref Range Status   Specimen Description URINE, RANDOM  Final   Special Requests   Final    NONE Performed at [facility], [place] [address] [place], [place] [address]    Culture >=100,000 COLONIES/mL ESCHERICHIA COLI (A)  Final   Report Status 04/18/2019 FINAL  Final   Organism ID, Bacteria ESCHERICHIA COLI (A)  Final      Susceptibility   Escherichia coli - MIC*    AMPICILLIN >=32 RESISTANT Resistant     CEFAZOLIN <=4 SENSITIVE Sensitive     CEFTRIAXONE <=1 SENSITIVE Sensitive     CIPROFLOXACIN <=0.25 SENSITIVE Sensitive     GENTAMICIN >=16 RESISTANT Resistant     IMIPENEM <=0.25 SENSITIVE Sensitive     NITROFURANTOIN <=16 SENSITIVE Sensitive     TRIMETH/SULFA >=320 RESISTANT Resistant     AMPICILLIN/SULBACTAM 16 INTERMEDIATE Intermediate     PIP/TAZO <=4 SENSITIVE Sensitive     Extended ESBL NEGATIVE Sensitive     * >=100,000 COLONIES/mL ESCHERICHIA COLI  SARS Coronavirus 2 (CEPHEID- Performed in [place] hospital lab), Hosp Order     Status: None   Collection Time: 04/17/19  6:16 AM  Result Value Ref Range Status   SARS Coronavirus 2 NEGATIVE NEGATIVE Final    Comment: (NOTE) If result is  NEGATIVE SARS-CoV-2 target nucleic acids are NOT DETECTED. The SARS-CoV-2 RNA is generally detectable in upper and lower  respiratory specimens during the acute phase of infection. The lowest  concentration of SARS-CoV-2 viral copies this assay can detect is 250  copies / mL. A negative result does not preclude SARS-CoV-2 infection  and should not be used as the sole basis for treatment or other  patient management decisions.  A negative result may occur with  improper specimen collection / handling, submission of specimen other  than nasopharyngeal swab, presence of viral mutation(s) within the  areas targeted by this assay, and inadequate number of viral copies  (<250 copies / mL). A negative result must be combined with clinical  observations, patient history, and epidemiological information. If result is POSITIVE SARS-CoV-2 target nucleic acids are DETECTED. The SARS-CoV-2 RNA is generally detectable in upper and lower  respiratory specimens dur ing the acute phase of infection.  Positive  results are indicative of active infection with SARS-CoV-2.  Clinical  correlation with patient history and other diagnostic information is  necessary to determine patient infection status.  Positive results do  not rule out bacterial infection or co-infection with other viruses. If result is PRESUMPTIVE POSTIVE SARS-CoV-2 nucleic acids MAY BE PRESENT.   A presumptive positive result was obtained on the submitted specimen  and confirmed on repeat testing.  While 2019 novel coronavirus  (SARS-CoV-2) nucleic acids may be present in the submitted sample  additional confirmatory testing may be necessary for epidemiological  and / or clinical management purposes  to differentiate between  SARS-CoV-2 and other Sarbecovirus currently known to infect humans.  If clinically indicated additional testing with an alternate test  methodology [phone]) is advised. The SARS-CoV-2 RNA is generally  detectable  in upper and lower respiratory sp ecimens during the acute  phase of infection. The expected result is Negative.  Fact Sheet for Patients:  StrictlyIdeas.no Fact Sheet for Healthcare Providers: BankingDealers.co.za This test is not yet approved or cleared by the [place] FDA and has been authorized for detection and/or diagnosis of SARS-CoV-2 by FDA under an Emergency Use Authorization (EUA).  This EUA will remain in effect (meaning this test can be used) for the duration of the COVID-19 declaration under Section 564(b)(1) of the Act, 21 U.S.C. section 360bbb-3(b)(1), unless the authorization is terminated or revoked sooner. Performed at [facility], [place] [address], [place], [place] [address]   Urine culture     Status: Abnormal   Collection Time: 04/19/19  7:12 PM  Result Value Ref Range Status   Specimen Description   Final    URINE, CLEAN CATCH Performed at [facility], [place] [address], [place], [place] [address]    Special Requests   Final    NONE Performed at [facility], [place] [address], [place], [place] [address]    Culture (A)  Final    <10,000 COLONIES/mL INSIGNIFICANT GROWTH Performed at [place] [address] [place], [place] [address]    Report Status 04/21/2019 FINAL  Final  Blood culture (routine x 2)     Status: None (Preliminary result)   Collection Time: 04/19/19  7:12 PM  Result Value Ref Range Status   Specimen Description   Final    BLOOD BLOOD LEFT HAND Performed at [facility] [address], [place], [place] [address]    Special Requests   Final    BOTTLES DRAWN AEROBIC AND ANAEROBIC Blood Culture results may not be optimal due to an excessive volume of blood received in culture bottles Performed at [place] [address] [place], [place] [address]    Culture   Final    NO GROWTH 1 DAY Performed  at [facility], [place] [address], [place], [place] [address]    Report Status PENDING  Incomplete  SARS Coronavirus 2 (CEPHEID - Performed in [place] hospital lab), Hosp Order     Status: None   Collection Time: 04/19/19  7:12 PM  Result Value Ref Range Status   SARS Coronavirus 2 NEGATIVE NEGATIVE Final    Comment: (NOTE) If result is NEGATIVE SARS-CoV-2 target nucleic acids are NOT DETECTED. The SARS-CoV-2 RNA is generally detectable in upper and lower  respiratory specimens during the acute phase of infection. The lowest  concentration of SARS-CoV-2 viral copies this assay can detect is 250  copies / mL. A negative result does not preclude SARS-CoV-2 infection  and should not be used as the sole basis for treatment or other  patient management decisions.  A negative result may occur with  improper specimen collection / handling, submission of specimen other  than nasopharyngeal swab, presence of viral mutation(s) within the  areas targeted by this assay, and inadequate number of viral copies  (<250 copies / mL). A negative result must be combined with clinical  observations, patient history, and epidemiological information. If result is POSITIVE SARS-CoV-2 target nucleic acids are DETECTED. The SARS-CoV-2 RNA is generally detectable in upper and lower  respiratory specimens dur ing the acute phase of infection.  Positive  results are indicative of active infection with SARS-CoV-2.  Clinical  correlation with patient history and other diagnostic information is  necessary to determine patient infection status.  Positive results do  not rule out bacterial infection or co-infection with other viruses. If result  is PRESUMPTIVE POSTIVE SARS-CoV-2 nucleic acids MAY BE PRESENT.   A presumptive positive result was obtained on the submitted specimen  and confirmed on repeat testing.  While 2019 novel coronavirus  (SARS-CoV-2) nucleic acids may be present in the submitted sample   additional confirmatory testing may be necessary for epidemiological  and / or clinical management purposes  to differentiate between  SARS-CoV-2 and other Sarbecovirus currently known to infect humans.  If clinically indicated additional testing with an alternate test  methodology [phone]) is advised. The SARS-CoV-2 RNA is generally  detectable in upper and lower respiratory sp ecimens during the acute  phase of infection. The expected result is Negative. Fact Sheet for Patients:  StrictlyIdeas.no Fact Sheet for Healthcare Providers: BankingDealers.co.za This test is not yet approved or cleared by the [place] FDA and has been authorized for detection and/or diagnosis of SARS-CoV-2 by FDA under an Emergency Use Authorization (EUA).  This EUA will remain in effect (meaning this test can be used) for the duration of the COVID-19 declaration under Section 564(b)(1) of the Act, 21 U.S.C. section 360bbb-3(b)(1), unless the authorization is terminated or revoked sooner. Performed at [facility], [place] [address] [place], [place] [address]   Blood culture (routine x 2)     Status: None (Preliminary result)   Collection Time: 04/19/19  7:17 PM  Result Value Ref Range Status   Specimen Description   Final    BLOOD BLOOD RIGHT WRIST Performed at [facility] [address] [place], [place] [address]    Special Requests   Final    BOTTLES DRAWN AEROBIC AND ANAEROBIC Blood Culture results may not be optimal due to an excessive volume of blood received in culture bottles Performed at [place] [address], [place], [place] [address]    Culture   Final    NO GROWTH 1 DAY Performed at [facility], [place] [address] [place], [place] [address]    Report Status PENDING  Incomplete       Radiology Studies: Dg Abd 1 View  Result Date: 04/19/2019 CLINICAL DATA:   Abdominal distension.  Abdominal pain. EXAM: ABDOMEN - 1 VIEW COMPARISON:  07/18/2018 FINDINGS: There is mild gaseous distention of several loops of small bowel scattered throughout the abdomen. These measure up to approximately 2.6 cm. There is no pneumatosis. No free air. A metallic electronic device projects over the right upper quadrant and obscures portions of the abdomen. There is a moderate amount of stool throughout the colon. IMPRESSION: 1. Nonobstructive bowel gas pattern. 2. Moderate amount of stool in the colon. Electronically Signed   By: Constance Holster M.D.   On: 04/19/2019 23:35   Ct Abdomen Pelvis W Contrast  Result Date: 04/20/2019 CLINICAL DATA:  85 year old male with history of bladder cancer diagnosed in 2010 presenting with recurrent hematuria. History of multiple urinary tract infections. EXAM: CT ABDOMEN AND PELVIS WITH CONTRAST TECHNIQUE: Multidetector CT imaging of the abdomen and pelvis was performed using the standard protocol following bolus administration of intravenous contrast. CONTRAST:  166mL OMNIPAQUE IOHEXOL 300 MG/ML  SOLN COMPARISON:  CT of the abdomen and pelvis 08/14/2018. FINDINGS: Lower chest: Mild scarring in the dependent portions of the lower lobes of the lungs bilaterally. Aortic atherosclerosis. Hepatobiliary: No suspicious cystic or solid hepatic lesions. No intra or extrahepatic biliary ductal dilatation. Status post cholecystectomy. Pancreas: No pancreatic mass. No pancreatic ductal dilatation. No pancreatic or peripancreatic fluid or inflammatory changes. Spleen: Unremarkable. Adrenals/Urinary  Tract: Multiple low-attenuation lesions in the kidneys, compatible with simple cysts, largest of which measures 4.7 cm in the upper pole. Multiple small low-attenuation lesions in the parapelvic regions bilaterally, compatible with peripelvic cysts. No definite suspicious renal lesions. No hydroureteronephrosis. Urinary bladder is unremarkable in appearance. Bilateral  adrenal glands are normal in appearance. Stomach/Bowel: Normal appearance of the stomach. No pathologic dilatation of small bowel or colon. The appendix is not confidently identified and may be surgically absent. Regardless, there are no inflammatory changes noted adjacent to the cecum to suggest the presence of an acute appendicitis at this time. Vascular/Lymphatic: Aortic atherosclerosis, without evidence of aneurysm or dissection in the abdominal or pelvic vasculature. Prominent but nonenlarged posterolateral right-sided mesorectal lymph node measuring 8 mm (nonspecific). No lymphadenopathy noted in the abdomen or pelvis. Reproductive: Prostate gland is severely enlarged and heterogeneous in appearance with median lobe hypertrophy, measuring 7.7 x 6.3 x 8.6 cm. Seminal vesicles are unremarkable in appearance. Other: No significant volume of ascites.  No pneumoperitoneum. Musculoskeletal: There are no aggressive appearing lytic or blastic lesions noted in the visualized portions of the skeleton. IMPRESSION: 1. No findings to account for the patient's history of hematuria. Specifically, no urinary tract calculi. 2. Severe prostatomegaly with median lobe hypertrophy. 3. Multiple low-attenuation lesions in both kidneys, compatible with a combination of simple cysts and parapelvic cysts. 4. Aortic atherosclerosis. 5. Additional incidental findings, as above. Electronically Signed   By: Vinnie Langton M.D.   On: 04/20/2019 09:37   Dg Chest Portable 1 View  Result Date: 04/19/2019 CLINICAL DATA:  85 year old male with recent diagnosis of UTI. EXAM: PORTABLE CHEST 1 VIEW COMPARISON:  Chest radiograph dated 04/17/2019 FINDINGS: Shallow inspiration. There is no focal consolidation, or pneumothorax. Blunting of the left costophrenic angle likely related to atelectasis although trace pleural effusion is not excluded. Top-normal cardiac silhouette. No acute osseous pathology. IMPRESSION: No focal consolidation.  Electronically Signed   By: Anner Crete M.D.   On: 04/19/2019 20:03      Scheduled Meds: . Carbidopa-Levodopa ER  1 tablet Oral TID WC  . phenazopyridine  100 mg Oral TID WC  . tamsulosin  0.4 mg Oral Daily   Continuous Infusions: . cefTRIAXone (ROCEPHIN)  IV 1 g (04/20/19 2016)     LOS: 1 day    Time spent: 35 minutes   Dessa Phi, DO [facility] [email or web address] 04/21/2019, 12:38 PM

## 2019-04-21 NOTE — Evaluation (Signed)
Physical Therapy Evaluation Patient Details Name: Jeremiah Zimmerman MRN: [id number] DOB: 1933/12/21 Today's Date: 04/21/2019   History of Present Illness  Jeremiah Zimmerman is a 85 y.o. male with history of SBO, hypertension, hyperlipidemia, Parkinson's, bladder cancer 2010 status post surgical resection, BPH presents to the ED for fever of 104.3 onset approximately 2 hours prior to arrival.  Associated with intense suprapubic abdominal pain, fullness, urinary urgency, hematuria.  Patient was admitted 6/1 to 6/3 for sepsis due to gram-negative UTI, urine culture grew E. coli with resistance to ampicillin and Bactrim, indeterminate to cefazolin.  Clinical Impression   Pt presents with generalized weakness, increased time and effort to perform tasks due to catheter-related pain, shuffling gait with mild unsteadiness with challenges to gait, and decreased tolerance for activity. Pt to benefit from acute PT to address deficits. Pt ambulated 250 ft with and without RW with min guard to supervision level assist, pt steady without AD if PT does not challenge gait. PT recommending no PT follow up at this time, will continue to see pt acutely but pt is close to baseline and is mostly limiting by bladder/catheter pain currently.  PT to progress mobility as tolerated, and will continue to follow acutely.      Follow Up Recommendations No PT follow up    Equipment Recommendations  None recommended by PT    Recommendations for Other Services       Precautions / Restrictions Precautions Precautions: Fall Restrictions Weight Bearing Restrictions: No      Mobility  Bed Mobility Overal bed mobility: Needs Assistance Bed Mobility: Supine to Sit     Supine to sit: HOB elevated;Min assist     General bed mobility comments: Min assist for scooting pt to EOB, increased time and effort to perform.   Transfers Overall transfer level: Needs assistance Equipment used: Rolling walker (2 wheeled) Transfers:  Sit to/from Stand Sit to Stand: Min guard         General transfer comment: Min guard for safety, pt rocking forward to increase momentum to stand. Verbal cuing for hand placement.   Ambulation/Gait Ambulation/Gait assistance: Supervision;Min guard Gait Distance (Feet): 250 Feet Assistive device: Rolling walker (2 wheeled);None Gait Pattern/deviations: Shuffle;Decreased stride length;Step-through pattern Gait velocity: decr    General Gait Details: Min guard to supervision for safety, mild shuffling gait noted with decreased foot clearance bilaterally. Pt initially with use of RW, trialled last 100 ft without RW. Pt stood near hallway rail in case of need of balance. Increased time to turn, mild unsteadiness during challenge to gait.    Stairs            Wheelchair Mobility    Modified Rankin (Stroke Patients Only)       Balance Overall balance assessment: Needs assistance Sitting-balance support: No upper extremity supported;Feet supported Sitting balance-Leahy Scale: Good       Standing balance-Leahy Scale: Fair Standing balance comment: able to ambulate without AD, reaching for environment occasionally                              Pertinent Vitals/Pain Pain Assessment: 0-10 Pain Score: 4  Pain Location: bladder/abdominal area Pain Descriptors / Indicators: Burning Pain Intervention(s): Monitored during session;Repositioned;Limited activity within patient's tolerance    Home Living Family/patient expects to be discharged to:: Private residence Living Arrangements: Spouse/significant other Available Help at Discharge: Family;Available 24 hours/day Type of Home: House Home Access: Stairs to enter  Entrance Stairs-Number of Steps: 1 Home Layout: One level Home Equipment: Cane - single point;Walker - 2 wheels      Prior Function Level of Independence: Independent with assistive device(s)   Gait / Transfers Assistance Needed: pt reports that he  only uses RW if he feels he needs to and cane when out in community/in the yard  ADL's / Homemaking Assistance Needed: Pt reports he assists wife with housework, drives self        Hand Dominance   Dominant Hand: Right    Extremity/Trunk Assessment   Upper Extremity Assessment Upper Extremity Assessment: Defer to OT evaluation    Lower Extremity Assessment Lower Extremity Assessment: Generalized weakness    Cervical / Trunk Assessment Cervical / Trunk Assessment: Normal  Communication   Communication: No difficulties  Cognition Arousal/Alertness: Awake/alert Behavior During Therapy: WFL for tasks assessed/performed Overall Cognitive Status: Within Functional Limits for tasks assessed                                        General Comments      Exercises     Assessment/Plan    PT Assessment Patient needs continued PT services  PT Problem List Decreased strength;Decreased mobility;Decreased activity tolerance;Decreased balance;Pain       PT Treatment Interventions DME instruction;Functional mobility training;Balance training;Patient/family education;Gait training;Therapeutic activities;Stair training;Therapeutic exercise    PT Goals (Current goals can be found in the Care Plan section)  Acute Rehab PT Goals Patient Stated Goal: to go home  PT Goal Formulation: With patient Time For Goal Achievement: 05/05/19 Potential to Achieve Goals: Good    Frequency Min 3X/week   Barriers to discharge        Co-evaluation               AM-PAC PT "6 Clicks" Mobility  Outcome Measure Help needed turning from your back to your side while in a flat bed without using bedrails?: None Help needed moving from lying on your back to sitting on the side of a flat bed without using bedrails?: None Help needed moving to and from a bed to a chair (including a wheelchair)?: A Little Help needed standing up from a chair using your arms (e.g., wheelchair or  bedside chair)?: A Little Help needed to walk in hospital room?: A Little Help needed climbing 3-5 steps with a railing? : A Little 6 Click Score: 20    End of Session Equipment Utilized During Treatment: Gait belt Activity Tolerance: Patient tolerated treatment well Patient left: with call bell/phone within reach;in chair;with chair alarm set Nurse Communication: Mobility status PT Visit Diagnosis: Unsteadiness on feet (R26.81);Difficulty in walking, not elsewhere classified (R26.2)    Time: 0092-3300 PT Time Calculation (min) (ACUTE ONLY): 20 min   Charges:   PT Evaluation $PT Eval Low Complexity: 1 Low         Mclane Arora Conception Chancy, PT Acute Rehabilitation Services Pager [phone]  Office [phone]   Whittley Carandang D Ernestyne Caldwell 04/21/2019, 1:47 PM

## 2019-04-21 NOTE — Progress Notes (Signed)
Pharmacy Antibiotic Note  Jeremiah Zimmerman is a 85 y.o. male admitted on 04/19/2019 with UTI.  Pharmacy has been consulted for Cipro dosing. He has been on IV rocephin, now transitioning to oral therapy.   Urology prefers to use Cipro for prostatitis coverage.  Patient's renal function at baseline.    Plan: Cipro 500mg  PO Q12h Monitor closely for adverse effects of Cipro in elderly male.  No dose adjustments anticipated.  Pharmacy will sign off.  Please re-consult if needed.   Height: 5\' 10"  (177.8 cm) Weight: 160 lb (72.6 kg) IBW/kg (Calculated) : 73  Temp (24hrs), Avg:99.4 F (37.4 C), Min:98.7 F (37.1 C), Max:99.8 F (37.7 C)  Recent Labs  Lab 04/17/19 0211 04/17/19 0417 04/18/19 0243 04/19/19 0303 04/19/19 1911 04/19/19 1912 04/20/19 0235  WBC 14.7*  --  15.2* 10.9* 8.0  --  6.2  CREATININE 1.06  --  0.97 0.89 0.84  --  0.84  LATICACIDVEN 2.2* 1.9  --   --   --  1.0 0.8    Estimated Creatinine Clearance: 68.4 mL/min (by C-G formula based on SCr of 0.84 mg/dL).    Allergies  Allergen Reactions  . Erythromycin Nausea Only    REACTION: Intolerance to this medication with upset stomach  . Nitroglycerin Other (See Comments)    REACTION: Intolerance to this medication.  It drops his BP too low and quickly.  . Sulfa Antibiotics Nausea Only and Rash  . Tetracyclines & Related Nausea Only  . Amoxicillin Diarrhea and Nausea Only  . Penicillins Swelling and Rash    Has patient had a PCN reaction causing immediate rash, facial/tongue/throat swelling, SOB or lightheadedness with hypotension: Yes Has patient had a PCN reaction causing severe rash involving mucus membranes or skin necrosis: No Has patient had a PCN reaction that required hospitalization: No Has patient had a PCN reaction occurring within the last 10 years: No If all of the above answers are "NO", then may proceed with Cephalosporin use.   REACTION: Patient develops a rash and swelling at inject    Thank you  for allowing pharmacy to be a part of this patient's care.  Biagio Borg 04/21/2019 1:22 PM

## 2019-04-21 NOTE — Progress Notes (Signed)
Foley catheter has been leaking, catheter assessed. 2cc added to make 10 cc in [facility] balloon. Call placed to Urologist regarding leaking foley catheter. V.O received to administer B&O suppository. Order placed every 8 hours as needed.

## 2019-04-21 NOTE — Progress Notes (Signed)
Urology Inpatient Progress Report  Abdominal distension [R14.0] SIRS (systemic inflammatory response syndrome) (HCC) [R65.10] Acute cystitis with hematuria [N30.01]        Intv/Subj: No acute events overnight. Patient is without complaint except for some penile and bladder discomfort from the catheter.  He continues on ceftriaxone.  Urine culture from 04/17/2019 showed E. coli sensitive to ciprofloxacin.  Leukocytosis continues to downtrend.  He himself is feeling much improved.  Principal Problem:   Acute lower UTI Active Problems:   Malignant neoplasm of bladder (HCC)   Dyslipidemia   Essential hypertension   Sepsis due to gram-negative UTI (HCC)   Parkinson's disease ([place])   UTI (urinary tract infection)  Current Facility-Administered Medications  Medication Dose Route Frequency Provider Last Rate Last Dose  . acetaminophen (TYLENOL) tablet 650 mg  650 mg Oral Q6H PRN Toy Baker, MD   650 mg at 04/20/19 2116   Or  . acetaminophen (TYLENOL) suppository 650 mg  650 mg Rectal Q6H PRN Doutova, Anastassia, MD      . Carbidopa-Levodopa ER (SINEMET CR) 25-100 MG tablet controlled release 1 tablet  1 tablet Oral TID WC Toy Baker, MD   1 tablet at 04/21/19 0835  . cefTRIAXone (ROCEPHIN) 1 g in sodium chloride 0.9 % 100 mL IVPB  1 g Intravenous Q24H Doutova, Anastassia, MD 200 mL/hr at 04/20/19 2016 1 g at 04/20/19 2016  . docusate sodium (COLACE) capsule 100 mg  100 mg Oral BID PRN Toy Baker, MD      . HYDROcodone-acetaminophen (NORCO/VICODIN) 5-325 MG per tablet 1-2 tablet  1-2 tablet Oral Q4H PRN Toy Baker, MD   2 tablet at 04/20/19 0927  . morphine 2 MG/ML injection 2 mg  2 mg Intravenous Q3H PRN Pokhrel, Laxman, MD   2 mg at 04/20/19 1136  . ondansetron (ZOFRAN) tablet 4 mg  4 mg Oral Q6H PRN Doutova, Anastassia, MD       Or  . ondansetron (ZOFRAN) injection 4 mg  4 mg Intravenous Q6H PRN Doutova, Anastassia, MD   4 mg at 04/20/19 1332  .  phenazopyridine (PYRIDIUM) tablet 100 mg  100 mg Oral TID WC Doutova, Anastassia, MD   100 mg at 04/21/19 0835  . polyethylene glycol (MIRALAX / GLYCOLAX) packet 17 g  17 g Oral Daily PRN Doutova, Anastassia, MD      . tamsulosin (FLOMAX) capsule 0.4 mg  0.4 mg Oral Daily Doutova, Anastassia, MD   0.4 mg at 04/20/19 0800     Objective: Vital: Vitals:   04/20/19 2018 04/20/19 2119 04/21/19 0038 04/21/19 0409  BP: (!) 168/70 (!) 159/67 (!) 132/58 (!) 164/85  Pulse: 87  82 83  Resp: 18  16 18   Temp: 99.1 F (37.3 C)  99.8 F (37.7 C) 99.8 F (37.7 C)  TempSrc: Oral  Axillary Oral  SpO2: 93%  93% 93%  Weight:      Height:       I/Os: I/O last 3 completed shifts: In: 895.1 [P.O.:540; I.V.:255.1; IV Piggyback:100] Out: 1850 [Urine:1850]  Physical Exam:  General: Patient is in no apparent distress Lungs: Normal respiratory effort, chest expands symmetrically. GI: The abdomen is soft and nontender without mass. Foley: Draining clear yellow urine Ext: lower extremities symmetric  Lab Results: Recent Labs    04/19/19 0303 04/19/19 1911 04/20/19 0235  WBC 10.9* 8.0 6.2  HGB 11.8* 12.2* 11.9*  HCT 35.3* 38.5* 37.2*   Recent Labs    04/19/19 0303 04/19/19 1911 04/20/19 0235  NA 136  136 137  K 3.9 3.6 3.5  CL 105 107 107  CO2 23 24 24   GLUCOSE 116* 129* 118*  BUN 12 13 10   CREATININE 0.89 0.84 0.84  CALCIUM 8.3* 8.2* 8.0*   No results for input(s): LABPT, INR in the last 72 hours. No results for input(s): LABURIN in the last 72 hours. Results for orders placed or performed during the hospital encounter of 04/19/19  Urine culture     Status: Abnormal   Collection Time: 04/19/19  7:12 PM  Result Value Ref Range Status   Specimen Description   Final    URINE, CLEAN CATCH Performed at [facility], [place] [address] [place], [place] [address]    Special Requests   Final    NONE Performed at [facility], [place] [address]  [place], [place] [address]    Culture (A)  Final    <10,000 COLONIES/mL INSIGNIFICANT GROWTH Performed at [place] [address] [place], [place] [address]    Report Status 04/21/2019 FINAL  Final  SARS Coronavirus 2 (CEPHEID - Performed in [place] hospital lab), Hosp Order     Status: None   Collection Time: 04/19/19  7:12 PM  Result Value Ref Range Status   SARS Coronavirus 2 NEGATIVE NEGATIVE Final    Comment: (NOTE) If result is NEGATIVE SARS-CoV-2 target nucleic acids are NOT DETECTED. The SARS-CoV-2 RNA is generally detectable in upper and lower  respiratory specimens during the acute phase of infection. The lowest  concentration of SARS-CoV-2 viral copies this assay can detect is 250  copies / mL. A negative result does not preclude SARS-CoV-2 infection  and should not be used as the sole basis for treatment or other  patient management decisions.  A negative result may occur with  improper specimen collection / handling, submission of specimen other  than nasopharyngeal swab, presence of viral mutation(s) within the  areas targeted by this assay, and inadequate number of viral copies  (<250 copies / mL). A negative result must be combined with clinical  observations, patient history, and epidemiological information. If result is POSITIVE SARS-CoV-2 target nucleic acids are DETECTED. The SARS-CoV-2 RNA is generally detectable in upper and lower  respiratory specimens dur ing the acute phase of infection.  Positive  results are indicative of active infection with SARS-CoV-2.  Clinical  correlation with patient history and other diagnostic information is  necessary to determine patient infection status.  Positive results do  not rule out bacterial infection or co-infection with other viruses. If result is PRESUMPTIVE POSTIVE SARS-CoV-2 nucleic acids MAY BE PRESENT.   A presumptive positive result was obtained on the submitted specimen  and confirmed on repeat  testing.  While 2019 novel coronavirus  (SARS-CoV-2) nucleic acids may be present in the submitted sample  additional confirmatory testing may be necessary for epidemiological  and / or clinical management purposes  to differentiate between  SARS-CoV-2 and other Sarbecovirus currently known to infect humans.  If clinically indicated additional testing with an alternate test  methodology [phone]) is advised. The SARS-CoV-2 RNA is generally  detectable in upper and lower respiratory sp ecimens during the acute  phase of infection. The expected result is Negative. Fact Sheet for Patients:  StrictlyIdeas.no Fact Sheet for Healthcare Providers: BankingDealers.co.za This test is not yet approved or cleared by the [place] FDA and has been authorized for detection and/or diagnosis of SARS-CoV-2 by FDA under an Emergency Use Authorization (EUA).  This  EUA will remain in effect (meaning this test can be used) for the duration of the COVID-19 declaration under Section 564(b)(1) of the Act, 21 U.S.C. section 360bbb-3(b)(1), unless the authorization is terminated or revoked sooner. Performed at [facility], [place] [address] [place], [place] [address]     Studies/Results: Dg Abd 1 View  Result Date: 04/19/2019 CLINICAL DATA:  Abdominal distension.  Abdominal pain. EXAM: ABDOMEN - 1 VIEW COMPARISON:  07/18/2018 FINDINGS: There is mild gaseous distention of several loops of small bowel scattered throughout the abdomen. These measure up to approximately 2.6 cm. There is no pneumatosis. No free air. A metallic electronic device projects over the right upper quadrant and obscures portions of the abdomen. There is a moderate amount of stool throughout the colon. IMPRESSION: 1. Nonobstructive bowel gas pattern. 2. Moderate amount of stool in the colon. Electronically Signed   By: Constance Holster M.D.   On: 04/19/2019 23:35   Ct  Abdomen Pelvis W Contrast  Result Date: 04/20/2019 CLINICAL DATA:  85 year old male with history of bladder cancer diagnosed in 2010 presenting with recurrent hematuria. History of multiple urinary tract infections. EXAM: CT ABDOMEN AND PELVIS WITH CONTRAST TECHNIQUE: Multidetector CT imaging of the abdomen and pelvis was performed using the standard protocol following bolus administration of intravenous contrast. CONTRAST:  151mL OMNIPAQUE IOHEXOL 300 MG/ML  SOLN COMPARISON:  CT of the abdomen and pelvis 08/14/2018. FINDINGS: Lower chest: Mild scarring in the dependent portions of the lower lobes of the lungs bilaterally. Aortic atherosclerosis. Hepatobiliary: No suspicious cystic or solid hepatic lesions. No intra or extrahepatic biliary ductal dilatation. Status post cholecystectomy. Pancreas: No pancreatic mass. No pancreatic ductal dilatation. No pancreatic or peripancreatic fluid or inflammatory changes. Spleen: Unremarkable. Adrenals/Urinary Tract: Multiple low-attenuation lesions in the kidneys, compatible with simple cysts, largest of which measures 4.7 cm in the upper pole. Multiple small low-attenuation lesions in the parapelvic regions bilaterally, compatible with peripelvic cysts. No definite suspicious renal lesions. No hydroureteronephrosis. Urinary bladder is unremarkable in appearance. Bilateral adrenal glands are normal in appearance. Stomach/Bowel: Normal appearance of the stomach. No pathologic dilatation of small bowel or colon. The appendix is not confidently identified and may be surgically absent. Regardless, there are no inflammatory changes noted adjacent to the cecum to suggest the presence of an acute appendicitis at this time. Vascular/Lymphatic: Aortic atherosclerosis, without evidence of aneurysm or dissection in the abdominal or pelvic vasculature. Prominent but nonenlarged posterolateral right-sided mesorectal lymph node measuring 8 mm (nonspecific). No lymphadenopathy noted in the  abdomen or pelvis. Reproductive: Prostate gland is severely enlarged and heterogeneous in appearance with median lobe hypertrophy, measuring 7.7 x 6.3 x 8.6 cm. Seminal vesicles are unremarkable in appearance. Other: No significant volume of ascites.  No pneumoperitoneum. Musculoskeletal: There are no aggressive appearing lytic or blastic lesions noted in the visualized portions of the skeleton. IMPRESSION: 1. No findings to account for the patient's history of hematuria. Specifically, no urinary tract calculi. 2. Severe prostatomegaly with median lobe hypertrophy. 3. Multiple low-attenuation lesions in both kidneys, compatible with a combination of simple cysts and parapelvic cysts. 4. Aortic atherosclerosis. 5. Additional incidental findings, as above. Electronically Signed   By: Vinnie Langton M.D.   On: 04/20/2019 09:37   Dg Chest Portable 1 View  Result Date: 04/19/2019 CLINICAL DATA:  85 year old male with recent diagnosis of UTI. EXAM: PORTABLE CHEST 1 VIEW COMPARISON:  Chest radiograph dated 04/17/2019 FINDINGS: Shallow inspiration. There is no focal consolidation, or pneumothorax. Blunting of the left  costophrenic angle likely related to atelectasis although trace pleural effusion is not excluded. Top-normal cardiac silhouette. No acute osseous pathology. IMPRESSION: No focal consolidation. Electronically Signed   By: Anner Crete M.D.   On: 04/19/2019 20:03    Assessment: Urinary tract infection, likely acute prostatitis Urinary retention  Plan: Continue Foley catheter and Flomax.  He continues to improve clinically.  From my standpoint, he can be switched to p.o. ciprofloxacin based on allergy profile as well as urine culture sensitivity from 04/17/2019.  He will need to follow-up with Dr. Jeffie Pollock for voiding trial in 1 to 2 weeks.   Link Snuffer, MD Urology 04/21/2019, 9:48 AM

## 2019-04-21 NOTE — Progress Notes (Signed)
Call received from lab regarding respiratory panel order. Sample was collected but not sent from lab for processing.  Provider contacted and no need for recollect.

## 2019-04-22 LAB — BASIC METABOLIC PANEL
Anion gap: 5 (ref 5–15)
BUN: 13 mg/dL (ref 8–23)
CO2: 27 mmol/L (ref 22–32)
Calcium: 8.3 mg/dL — ABNORMAL LOW (ref 8.9–10.3)
Chloride: 106 mmol/L (ref 98–111)
Creatinine, Ser: 0.87 mg/dL (ref 0.61–1.24)
GFR calc Af Amer: >60 mL/min (ref >60)
GFR calc non Af Amer: >60 mL/min (ref >60)
Glucose, Bld: 100 mg/dL — ABNORMAL HIGH (ref 70–99)
Potassium: 3.9 mmol/L (ref 3.5–5.1)
Sodium: 138 mmol/L (ref 135–145)

## 2019-04-22 LAB — CULTURE, BLOOD (ROUTINE X 2)
Culture: NO GROWTH
Culture: NO GROWTH
Special Requests: ADEQUATE
Special Requests: ADEQUATE

## 2019-04-22 LAB — CBC
HCT: 40.8 % (ref 39.0–52.0)
Hemoglobin: 12.7 g/dL — ABNORMAL LOW (ref 13.0–17.0)
MCH: 29.5 pg (ref 26.0–34.0)
MCHC: 31.1 g/dL (ref 30.0–36.0)
MCV: 94.7 fL (ref 80.0–100.0)
Platelets: 125 10*3/uL — ABNORMAL LOW (ref 150–400)
RBC: 4.31 MIL/uL (ref 4.22–5.81)
RDW: 13 % (ref 11.5–15.5)
WBC: 5.9 10*3/uL (ref 4.0–10.5)
nRBC: 0 % (ref 0.0–0.2)

## 2019-04-22 MED ORDER — BELLADONNA ALKALOIDS-OPIUM 16.2-60 MG RE SUPP: 1.0000 | Freq: Three times a day (TID) | RECTAL | 0 refills | Status: DC | PRN

## 2019-04-22 MED ORDER — HYDROCODONE-ACETAMINOPHEN 5-325 MG PO TABS: 1.0000 | ORAL_TABLET | Freq: Four times a day (QID) | ORAL | 0 refills | Status: AC | PRN

## 2019-04-22 MED ORDER — CIPROFLOXACIN HCL 500 MG PO TABS: 500.0000 mg | ORAL_TABLET | Freq: Two times a day (BID) | ORAL | 0 refills | Status: AC

## 2019-04-22 NOTE — Discharge Summary (Addendum)
Physician Discharge Summary  Jeremiah Zimmerman BJS:[id number] DOB: August 11, 1980 DOA: 04/19/2019  PCP: Shon Baton, MD  Admit date: 04/19/2019 Discharge date: 04/22/2019  Admitted From: Home Disposition:  Home  Recommendations for Outpatient Follow-up:  1. Follow up with PCP in 1 week 2. Follow up with Urology in 1 week  3. Keep foley in place until urology follow up   Discharge Condition: Stable CODE STATUS: DNR  Diet recommendation: Regular   Brief/Interim Summary: Jeremiah Reggio Webbis an 85 y.o.malewith medical history significant ofSBO; HTN; HLD;[name];and bladder cancer (2010) presentedto the hospital with complaints of fever as high as 104 F. Patient did have worsening tremors from his Parkinson's disease. He also haddysuria for1 week.Numerous allergies to ABx so got started on levaquin for presumed urinary source. CXR negative.  Urology was also consulted. He had foley catheter placed, treated with antibiotics.   On day of discharge, he appeared comfortable, afebrile, eating and drinking without issue. Urology has cleared him for discharge home as well as PT. Discharge discussed with wife as well as daughter over the phone (spoke with daughter at length on day of discharge).   Discharge Diagnoses:   Sepsissecondary to E. coli UTI, likely acute prostatitis, sepsis present on admission -Urine culture with E. coli with resistance to ampicillin and Bactrim,indeterminate to cefazolin. De-escalate to p.o. Cipro for 10-14 days  -Blood culture negative to date -Repeat urine culture with no growth -Appreciate urology, patient will need follow-up with Dr. Jeffie Pollock for voiding trial in 1 to 2 weeks  BPH -Appreciate urology, patient will need follow-up with Dr. Jeffie Pollock for voiding trial in 1 to 2 weeks. Plan to discharge with foley in place   Parkinson's disease -Oncarbidopa-levodopa  Mild thrombocytopenia -Will closely monitor. No evidence of bleeding  at this time. Improved today.    Discharge Instructions  Discharge Instructions    Call MD for:  difficulty breathing, headache or visual disturbances   Complete by:  As directed    Call MD for:  extreme fatigue   Complete by:  As directed    Call MD for:  hives   Complete by:  As directed    Call MD for:  persistant dizziness or light-headedness   Complete by:  As directed    Call MD for:  persistant nausea and vomiting   Complete by:  As directed    Call MD for:  severe uncontrolled pain   Complete by:  As directed    Call MD for:  temperature >100.4   Complete by:  As directed    Diet general   Complete by:  As directed    Discharge instructions   Complete by:  As directed    You were cared for by a hospitalist during your hospital stay. If you have any questions about your discharge medications or the care you received while you were in the hospital after you are discharged, you can call the unit and ask to speak with the hospitalist on call if the hospitalist that took care of you is not available. Once you are discharged, your primary care physician will handle any further medical issues. Please note that NO REFILLS for any discharge medications will be authorized once you are discharged, as it is imperative that you return to your primary care physician (or establish a relationship with a primary care physician if you do not have one) for your aftercare needs so that they can reassess your need for medications and monitor your lab values.  Increase activity slowly   Complete by:  As directed      Allergies as of 04/22/2019      Reactions   Erythromycin Nausea Only   REACTION: Intolerance to this medication with upset stomach   Nitroglycerin Other (See Comments)   REACTION: Intolerance to this medication.  It drops his BP too low and quickly.   Sulfa Antibiotics Nausea Only, Rash   Tetracyclines & Related Nausea Only   Amoxicillin Diarrhea, Nausea Only   Penicillins  Swelling, Rash   Has patient had a PCN reaction causing immediate rash, facial/tongue/throat swelling, SOB or lightheadedness with hypotension: Yes Has patient had a PCN reaction causing severe rash involving mucus membranes or skin necrosis: No Has patient had a PCN reaction that required hospitalization: No Has patient had a PCN reaction occurring within the last 10 years: No If all of the above answers are "NO", then may proceed with Cephalosporin use. REACTION: Patient develops a rash and swelling at inject      Medication List    STOP taking these medications   levofloxacin 500 MG tablet Commonly known as:  Levaquin     TAKE these medications   Carbidopa-Levodopa ER 25-100 MG tablet controlled release Commonly known as:  SINEMET CR Take 1 tablet by mouth 3 (three) times daily. Notes to patient:  This afternoon and evening   ciprofloxacin 500 MG tablet Commonly known as:  CIPRO Take 1 tablet (500 mg total) by mouth 2 (two) times daily for 10 days. Notes to patient:  Next due tonight before bed   docusate sodium 100 MG capsule Commonly known as:  Colace Take 1 capsule (100 mg total) by mouth 2 (two) times daily as needed for mild constipation.   HYDROcodone-acetaminophen 5-325 MG tablet Commonly known as:  NORCO/VICODIN Take 1-2 tablets by mouth every 6 (six) hours as needed for up to 5 days for moderate pain.   opium-belladonna 16.2-60 MG suppository Commonly known as:  B&O SUPPRETTES Place 1 suppository rectally every 8 (eight) hours as needed for bladder spasms.   phenazopyridine 100 MG tablet Commonly known as:  PYRIDIUM Take 1 tablet (100 mg total) by mouth 3 (three) times daily with meals. Notes to patient:  This afternoon and evening   polyethylene glycol 17 g packet Commonly known as:  MiraLax Take 17 g by mouth daily as needed for mild constipation.   tamsulosin 0.4 MG Caps capsule Commonly known as:  FLOMAX Take 0.4 mg by mouth daily. Notes to patient:   Tomorrow 6/7      Follow-up Information    Irine Seal, MD Follow up.   Specialty:  Urology Why:  Office will call to arrange visit for follow up and catheter removal.  Contact information: [place] [address] [phone]        Shon Baton, MD. Schedule an appointment as soon as possible for a visit in 1 week(s).   Specialty:  Internal Medicine Contact information: [place] [address] [phone]          Allergies  Allergen Reactions  . Erythromycin Nausea Only    REACTION: Intolerance to this medication with upset stomach  . Nitroglycerin Other (See Comments)    REACTION: Intolerance to this medication.  It drops his BP too low and quickly.  . Sulfa Antibiotics Nausea Only and Rash  . Tetracyclines & Related Nausea Only  . Amoxicillin Diarrhea and Nausea Only  . Penicillins Swelling and Rash    Has  patient had a PCN reaction causing immediate rash, facial/tongue/throat swelling, SOB or lightheadedness with hypotension: Yes Has patient had a PCN reaction causing severe rash involving mucus membranes or skin necrosis: No Has patient had a PCN reaction that required hospitalization: No Has patient had a PCN reaction occurring within the last 10 years: No If all of the above answers are "NO", then may proceed with Cephalosporin use.   REACTION: Patient develops a rash and swelling at inject    Consultations:  Urology    Procedures/Studies: Dg Chest 2 View  Result Date: 04/17/2019 CLINICAL DATA:  85 year old male with fever. EXAM: CHEST - 2 VIEW COMPARISON:  Chest radiograph dated 05/26/2010 FINDINGS: Minimal left lung base densities, likely atelectatic changes. There is no focal consolidation, pleural effusion, or pneumothorax. The cardiac silhouette is within normal limits. Atherosclerotic calcification of the aortic arch. No acute osseous pathology. IMPRESSION: No active cardiopulmonary disease. Electronically Signed   By:  Anner Crete M.D.   On: 04/17/2019 02:47   Dg Abd 1 View  Result Date: 04/19/2019 CLINICAL DATA:  Abdominal distension.  Abdominal pain. EXAM: ABDOMEN - 1 VIEW COMPARISON:  07/18/2018 FINDINGS: There is mild gaseous distention of several loops of small bowel scattered throughout the abdomen. These measure up to approximately 2.6 cm. There is no pneumatosis. No free air. A metallic electronic device projects over the right upper quadrant and obscures portions of the abdomen. There is a moderate amount of stool throughout the colon. IMPRESSION: 1. Nonobstructive bowel gas pattern. 2. Moderate amount of stool in the colon. Electronically Signed   By: Constance Holster M.D.   On: 04/19/2019 23:35   Ct Abdomen Pelvis W Contrast  Result Date: 04/20/2019 CLINICAL DATA:  85 year old male with history of bladder cancer diagnosed in 2010 presenting with recurrent hematuria. History of multiple urinary tract infections. EXAM: CT ABDOMEN AND PELVIS WITH CONTRAST TECHNIQUE: Multidetector CT imaging of the abdomen and pelvis was performed using the standard protocol following bolus administration of intravenous contrast. CONTRAST:  156mL OMNIPAQUE IOHEXOL 300 MG/ML  SOLN COMPARISON:  CT of the abdomen and pelvis 08/14/2018. FINDINGS: Lower chest: Mild scarring in the dependent portions of the lower lobes of the lungs bilaterally. Aortic atherosclerosis. Hepatobiliary: No suspicious cystic or solid hepatic lesions. No intra or extrahepatic biliary ductal dilatation. Status post cholecystectomy. Pancreas: No pancreatic mass. No pancreatic ductal dilatation. No pancreatic or peripancreatic fluid or inflammatory changes. Spleen: Unremarkable. Adrenals/Urinary Tract: Multiple low-attenuation lesions in the kidneys, compatible with simple cysts, largest of which measures 4.7 cm in the upper pole. Multiple small low-attenuation lesions in the parapelvic regions bilaterally, compatible with peripelvic cysts. No definite  suspicious renal lesions. No hydroureteronephrosis. Urinary bladder is unremarkable in appearance. Bilateral adrenal glands are normal in appearance. Stomach/Bowel: Normal appearance of the stomach. No pathologic dilatation of small bowel or colon. The appendix is not confidently identified and may be surgically absent. Regardless, there are no inflammatory changes noted adjacent to the cecum to suggest the presence of an acute appendicitis at this time. Vascular/Lymphatic: Aortic atherosclerosis, without evidence of aneurysm or dissection in the abdominal or pelvic vasculature. Prominent but nonenlarged posterolateral right-sided mesorectal lymph node measuring 8 mm (nonspecific). No lymphadenopathy noted in the abdomen or pelvis. Reproductive: Prostate gland is severely enlarged and heterogeneous in appearance with median lobe hypertrophy, measuring 7.7 x 6.3 x 8.6 cm. Seminal vesicles are unremarkable in appearance. Other: No significant volume of ascites.  No pneumoperitoneum. Musculoskeletal: There are no aggressive appearing lytic or blastic lesions  noted in the visualized portions of the skeleton. IMPRESSION: 1. No findings to account for the patient's history of hematuria. Specifically, no urinary tract calculi. 2. Severe prostatomegaly with median lobe hypertrophy. 3. Multiple low-attenuation lesions in both kidneys, compatible with a combination of simple cysts and parapelvic cysts. 4. Aortic atherosclerosis. 5. Additional incidental findings, as above. Electronically Signed   By: Vinnie Langton M.D.   On: 04/20/2019 09:37   Dg Chest Portable 1 View  Result Date: 04/19/2019 CLINICAL DATA:  85 year old male with recent diagnosis of UTI. EXAM: PORTABLE CHEST 1 VIEW COMPARISON:  Chest radiograph dated 04/17/2019 FINDINGS: Shallow inspiration. There is no focal consolidation, or pneumothorax. Blunting of the left costophrenic angle likely related to atelectasis although trace pleural effusion is not  excluded. Top-normal cardiac silhouette. No acute osseous pathology. IMPRESSION: No focal consolidation. Electronically Signed   By: Anner Crete M.D.   On: 04/19/2019 20:03       Discharge Exam: Vitals:   04/22/19 0008 04/22/19 0405  BP: 138/64 (!) 141/68  Pulse: 66 63  Resp: 20 18  Temp: 99.4 F (37.4 C) 97.9 F (36.6 C)  SpO2: 95% 94%    General: Pt is alert, awake, not in acute distress Cardiovascular: RRR, S1/S2 +, no rubs, no gallops Respiratory: CTA bilaterally, no wheezing, no rhonchi Abdominal: Soft, NT, ND, bowel sounds + Extremities: no edema, no cyanosis    The results of significant diagnostics from this hospitalization (including imaging, microbiology, ancillary and laboratory) are listed below for reference.     Microbiology: Recent Results (from the past 240 hour(s))  Blood Culture (routine x 2)     Status: None   Collection Time: 04/17/19  2:09 AM  Result Value Ref Range Status   Specimen Description BLOOD RIGHT HAND  Final   Special Requests   Final    BOTTLES DRAWN AEROBIC AND ANAEROBIC Blood Culture adequate volume   Culture   Final    NO GROWTH 5 DAYS Performed at [facility], [address] [place], [place] [address]    Report Status 04/22/2019 FINAL  Final  Blood Culture (routine x 2)     Status: None   Collection Time: 04/17/19  5:09 AM  Result Value Ref Range Status   Specimen Description BLOOD RIGHT ANTECUBITAL  Final   Special Requests   Final    BOTTLES DRAWN AEROBIC AND ANAEROBIC Blood Culture adequate volume   Culture   Final    NO GROWTH 5 DAYS Performed at [facility], [place] [address], [place], [place] [address]    Report Status 04/22/2019 FINAL  Final  Urine culture     Status: Abnormal   Collection Time: 04/17/19  5:35 AM  Result Value Ref Range Status   Specimen Description URINE, RANDOM  Final   Special Requests   Final    NONE Performed at [facility], [place] [address] [place], [place] [address]     Culture >=100,000 COLONIES/mL ESCHERICHIA COLI (A)  Final   Report Status 04/18/2019 FINAL  Final   Organism ID, Bacteria ESCHERICHIA COLI (A)  Final      Susceptibility   Escherichia coli - MIC*    AMPICILLIN >=32 RESISTANT Resistant     CEFAZOLIN <=4 SENSITIVE Sensitive     CEFTRIAXONE <=1 SENSITIVE Sensitive     CIPROFLOXACIN <=0.25 SENSITIVE Sensitive     GENTAMICIN >=16 RESISTANT Resistant     IMIPENEM <=0.25 SENSITIVE Sensitive     NITROFURANTOIN <=16  SENSITIVE Sensitive     TRIMETH/SULFA >=320 RESISTANT Resistant     AMPICILLIN/SULBACTAM 16 INTERMEDIATE Intermediate     PIP/TAZO <=4 SENSITIVE Sensitive     Extended ESBL NEGATIVE Sensitive     * >=100,000 COLONIES/mL ESCHERICHIA COLI  SARS Coronavirus 2 (CEPHEID- Performed in [facility] lab), Hosp Order     Status: None   Collection Time: 04/17/19  6:16 AM  Result Value Ref Range Status   SARS Coronavirus 2 NEGATIVE NEGATIVE Final    Comment: (NOTE) If result is NEGATIVE SARS-CoV-2 target nucleic acids are NOT DETECTED. The SARS-CoV-2 RNA is generally detectable in upper and lower  respiratory specimens during the acute phase of infection. The lowest  concentration of SARS-CoV-2 viral copies this assay can detect is 250  copies / mL. A negative result does not preclude SARS-CoV-2 infection  and should not be used as the sole basis for treatment or other  patient management decisions.  A negative result may occur with  improper specimen collection / handling, submission of specimen other  than nasopharyngeal swab, presence of viral mutation(s) within the  areas targeted by this assay, and inadequate number of viral copies  (<250 copies / mL). A negative result must be combined with clinical  observations, patient history, and epidemiological information. If result is POSITIVE SARS-CoV-2 target nucleic acids are DETECTED. The SARS-CoV-2 RNA is generally detectable in upper and lower  respiratory specimens  dur ing the acute phase of infection.  Positive  results are indicative of active infection with SARS-CoV-2.  Clinical  correlation with patient history and other diagnostic information is  necessary to determine patient infection status.  Positive results do  not rule out bacterial infection or co-infection with other viruses. If result is PRESUMPTIVE POSTIVE SARS-CoV-2 nucleic acids MAY BE PRESENT.   A presumptive positive result was obtained on the submitted specimen  and confirmed on repeat testing.  While 2019 novel coronavirus  (SARS-CoV-2) nucleic acids may be present in the submitted sample  additional confirmatory testing may be necessary for epidemiological  and / or clinical management purposes  to differentiate between  SARS-CoV-2 and other Sarbecovirus currently known to infect humans.  If clinically indicated additional testing with an alternate test  methodology [phone] is advised. The SARS-CoV-2 RNA is generally  detectable in upper and lower respiratory sp ecimens during the acute  phase of infection. The expected result is Negative. Fact Sheet for Patients:  StrictlyIdeas.no Fact Sheet for Healthcare Providers: BankingDealers.co.za This test is not yet approved or cleared by the [place] FDA and has been authorized for detection and/or diagnosis of SARS-CoV-2 by FDA under an Emergency Use Authorization (EUA).  This EUA will remain in effect (meaning this test can be used) for the duration of the COVID-19 declaration under Section 564(b)(1) of the Act, 21 U.S.C. section 360bbb-3(b)(1), unless the authorization is terminated or revoked sooner. Performed at [facility], [place] [address] [place], [place] [address]   Urine culture     Status: Abnormal   Collection Time: 04/19/19  7:12 PM  Result Value Ref Range Status   Specimen Description   Final    URINE, CLEAN CATCH Performed at [facility], [place] [address] [place], [place] [address]    Special Requests   Final    NONE Performed at [facility], [place] [address], [place], [place] [address]    Culture (A)  Final    <10,000 COLONIES/mL INSIGNIFICANT GROWTH Performed at  [facility], [place] [address], [place], [place] [address]    Report Status 04/21/2019 FINAL  Final  Blood culture (routine x 2)     Status: None (Preliminary result)   Collection Time: 04/19/19  7:12 PM  Result Value Ref Range Status   Specimen Description   Final    BLOOD LEFT HAND Performed at [facility], [place] [address] [place], [place] [address]    Special Requests   Final    BOTTLES DRAWN AEROBIC AND ANAEROBIC Blood Culture results may not be optimal due to an excessive volume of blood received in culture bottles Performed at [facility] [address] [place], [place] [address]    Culture   Final    NO GROWTH 2 DAYS Performed at [facility] [address] [place], [place] [address]    Report Status PENDING  Incomplete  SARS Coronavirus 2 (CEPHEID - Performed in [facility] hospital lab), Hosp Order     Status: None   Collection Time: 04/19/19  7:12 PM  Result Value Ref Range Status   SARS Coronavirus 2 NEGATIVE NEGATIVE Final    Comment: (NOTE) If result is NEGATIVE SARS-CoV-2 target nucleic acids are NOT DETECTED. The SARS-CoV-2 RNA is generally detectable in upper and lower  respiratory specimens during the acute phase of infection. The lowest  concentration of SARS-CoV-2 viral copies this assay can detect is 250  copies / mL. A negative result does not preclude SARS-CoV-2 infection  and should not be used as the sole basis for treatment or other  patient management decisions.  A negative result may occur with  improper specimen collection / handling, submission of specimen other  than nasopharyngeal swab, presence of viral mutation(s) within the  areas targeted by this  assay, and inadequate number of viral copies  (<250 copies / mL). A negative result must be combined with clinical  observations, patient history, and epidemiological information. If result is POSITIVE SARS-CoV-2 target nucleic acids are DETECTED. The SARS-CoV-2 RNA is generally detectable in upper and lower  respiratory specimens dur ing the acute phase of infection.  Positive  results are indicative of active infection with SARS-CoV-2.  Clinical  correlation with patient history and other diagnostic information is  necessary to determine patient infection status.  Positive results do  not rule out bacterial infection or co-infection with other viruses. If result is PRESUMPTIVE POSTIVE SARS-CoV-2 nucleic acids MAY BE PRESENT.   A presumptive positive result was obtained on the submitted specimen  and confirmed on repeat testing.  While 2019 novel coronavirus  (SARS-CoV-2) nucleic acids may be present in the submitted sample  additional confirmatory testing may be necessary for epidemiological  and / or clinical management purposes  to differentiate between  SARS-CoV-2 and other Sarbecovirus currently known to infect humans.  If clinically indicated additional testing with an alternate test  methodology [phone]) is advised. The SARS-CoV-2 RNA is generally  detectable in upper and lower respiratory sp ecimens during the acute  phase of infection. The expected result is Negative. Fact Sheet for Patients:  StrictlyIdeas.no Fact Sheet for Healthcare Providers: BankingDealers.co.za This test is not yet approved or cleared by the [place] FDA and has been authorized for detection and/or diagnosis of SARS-CoV-2 by FDA under an Emergency Use Authorization (EUA).  This EUA will remain in effect (meaning this test can be used) for the duration of the COVID-19 declaration under Section 564(b)(1) of the Act, 21 U.S.C. section  360bbb-3(b)(1),  unless the authorization is terminated or revoked sooner. Performed at [facility], [place] [address], [place], [place] [address]   Blood culture (routine x 2)     Status: None (Preliminary result)   Collection Time: 04/19/19  7:17 PM  Result Value Ref Range Status   Specimen Description   Final    BLOOD RIGHT WRIST Performed at [facility], [address], [place], [place] [address]    Special Requests   Final    BOTTLES DRAWN AEROBIC AND ANAEROBIC Blood Culture results may not be optimal due to an excessive volume of blood received in culture bottles Performed at [place] [address] [place], [place] [address]    Culture   Final    NO GROWTH 2 DAYS Performed at [facility] [address] [place], [place] [address]    Report Status PENDING  Incomplete     Labs: BNP (last 3 results) No results for input(s): BNP in the last 8760 hours. Basic Metabolic Panel: Recent Labs  Lab 04/18/19 0243 04/19/19 0303 04/19/19 1911 04/20/19 0235 04/22/19 0543  NA 136 136 136 137 138  K 3.9 3.9 3.6 3.5 3.9  CL 106 105 107 107 106  CO2 23 23 24 24 27   GLUCOSE 117* 116* 129* 118* 100*  BUN 9 12 13 10 13   CREATININE 0.97 0.89 0.84 0.84 0.87  CALCIUM 8.3* 8.3* 8.2* 8.0* 8.3*  MG  --   --   --  1.7  --   PHOS  --   --   --  3.3  --    Liver Function Tests: Recent Labs  Lab 04/17/19 0211 04/20/19 0235  AST 16 21  ALT 11 23  ALKPHOS 66 65  BILITOT 1.7* 0.7  PROT 6.8 5.8*  ALBUMIN 3.6 2.7*   No results for input(s): LIPASE, AMYLASE in the last 168 hours. No results for input(s): AMMONIA in the last 168 hours. CBC: Recent Labs  Lab 04/17/19 0211 04/18/19 0243 04/19/19 0303 04/19/19 1911 04/20/19 0235 04/22/19 0543  WBC 14.7* 15.2* 10.9* 8.0 6.2 5.9  NEUTROABS 12.6*  --   --  6.6  --   --   HGB 14.4 12.2* 11.8* 12.2* 11.9* 12.7*  HCT 43.8 37.2* 35.3* 38.5* 37.2* 40.8  MCV 91.6 92.3 91.0 92.8 95.1 94.7  PLT 104*  75* 78* 98* 96* 125*   Cardiac Enzymes: No results for input(s): CKTOTAL, CKMB, CKMBINDEX, TROPONINI in the last 168 hours. BNP: Invalid input(s): POCBNP CBG: No results for input(s): GLUCAP in the last 168 hours. D-Dimer No results for input(s): DDIMER in the last 72 hours. Hgb A1c No results for input(s): HGBA1C in the last 72 hours. Lipid Profile No results for input(s): CHOL, HDL, LDLCALC, TRIG, CHOLHDL, LDLDIRECT in the last 72 hours. Thyroid function studies Recent Labs    04/20/19 0235  TSH 2.385   Anemia work up No results for input(s): VITAMINB12, FOLATE, FERRITIN, TIBC, IRON, RETICCTPCT in the last 72 hours. Urinalysis    Component Value Date/Time   COLORURINE AMBER (A) 04/19/2019 1911   APPEARANCEUR CLOUDY (A) 04/19/2019 1911   LABSPEC 1.018 04/19/2019 1911   PHURINE 7.0 04/19/2019 1911   GLUCOSEU NEGATIVE 04/19/2019 1911   HGBUR SMALL (A) 04/19/2019 1911   BILIRUBINUR NEGATIVE 04/19/2019 1911   KETONESUR 5 (A) 04/19/2019 1911   PROTEINUR 100 (A) 04/19/2019 1911   UROBILINOGEN 0.2 05/26/2010 1229   NITRITE POSITIVE (A) 04/19/2019 1911  LEUKOCYTESUR MODERATE (A) 04/19/2019 1911   Sepsis Labs Invalid input(s): PROCALCITONIN,  WBC,  LACTICIDVEN Microbiology Recent Results (from the past 240 hour(s))  Blood Culture (routine x 2)     Status: None   Collection Time: 04/17/19  2:09 AM  Result Value Ref Range Status   Specimen Description BLOOD RIGHT HAND  Final   Special Requests   Final    BOTTLES DRAWN AEROBIC AND ANAEROBIC Blood Culture adequate volume   Culture   Final    NO GROWTH 5 DAYS Performed at [facility], [address] [place], [place] [address]    Report Status 04/22/2019 FINAL  Final  Blood Culture (routine x 2)     Status: None   Collection Time: 04/17/19  5:09 AM  Result Value Ref Range Status   Specimen Description BLOOD RIGHT ANTECUBITAL  Final   Special Requests   Final    BOTTLES DRAWN AEROBIC AND ANAEROBIC Blood Culture  adequate volume   Culture   Final    NO GROWTH 5 DAYS Performed at [facility], [place] [address], [place], [place] [address]    Report Status 04/22/2019 FINAL  Final  Urine culture     Status: Abnormal   Collection Time: 04/17/19  5:35 AM  Result Value Ref Range Status   Specimen Description URINE, RANDOM  Final   Special Requests   Final    NONE Performed at [facility], [place] [address] [place], [place] [address]    Culture >=100,000 COLONIES/mL ESCHERICHIA COLI (A)  Final   Report Status 04/18/2019 FINAL  Final   Organism ID, Bacteria ESCHERICHIA COLI (A)  Final      Susceptibility   Escherichia coli - MIC*    AMPICILLIN >=32 RESISTANT Resistant     CEFAZOLIN <=4 SENSITIVE Sensitive     CEFTRIAXONE <=1 SENSITIVE Sensitive     CIPROFLOXACIN <=0.25 SENSITIVE Sensitive     GENTAMICIN >=16 RESISTANT Resistant     IMIPENEM <=0.25 SENSITIVE Sensitive     NITROFURANTOIN <=16 SENSITIVE Sensitive     TRIMETH/SULFA >=320 RESISTANT Resistant     AMPICILLIN/SULBACTAM 16 INTERMEDIATE Intermediate     PIP/TAZO <=4 SENSITIVE Sensitive     Extended ESBL NEGATIVE Sensitive     * >=100,000 COLONIES/mL ESCHERICHIA COLI  SARS Coronavirus 2 (CEPHEID- Performed in [facility] hospital lab), Hosp Order     Status: None   Collection Time: 04/17/19  6:16 AM  Result Value Ref Range Status   SARS Coronavirus 2 NEGATIVE NEGATIVE Final    Comment: (NOTE) If result is NEGATIVE SARS-CoV-2 target nucleic acids are NOT DETECTED. The SARS-CoV-2 RNA is generally detectable in upper and lower  respiratory specimens during the acute phase of infection. The lowest  concentration of SARS-CoV-2 viral copies this assay can detect is 250  copies / mL. A negative result does not preclude SARS-CoV-2 infection  and should not be used as the sole basis for treatment or other  patient management decisions.  A negative result may occur with  improper specimen collection / handling, submission of specimen  other  than nasopharyngeal swab, presence of viral mutation(s) within the  areas targeted by this assay, and inadequate number of viral copies  (<250 copies / mL). A negative result must be combined with clinical  observations, patient history, and epidemiological information. If result is POSITIVE SARS-CoV-2 target nucleic acids are DETECTED. The SARS-CoV-2 RNA is generally detectable in upper and lower  respiratory specimens dur ing  the acute phase of infection.  Positive  results are indicative of active infection with SARS-CoV-2.  Clinical  correlation with patient history and other diagnostic information is  necessary to determine patient infection status.  Positive results do  not rule out bacterial infection or co-infection with other viruses. If result is PRESUMPTIVE POSTIVE SARS-CoV-2 nucleic acids MAY BE PRESENT.   A presumptive positive result was obtained on the submitted specimen  and confirmed on repeat testing.  While 2019 novel coronavirus  (SARS-CoV-2) nucleic acids may be present in the submitted sample  additional confirmatory testing may be necessary for epidemiological  and / or clinical management purposes  to differentiate between  SARS-CoV-2 and other Sarbecovirus currently known to infect humans.  If clinically indicated additional testing with an alternate test  methodology [phone]) is advised. The SARS-CoV-2 RNA is generally  detectable in upper and lower respiratory sp ecimens during the acute  phase of infection. The expected result is Negative. Fact Sheet for Patients:  StrictlyIdeas.no Fact Sheet for Healthcare Providers: BankingDealers.co.za This test is not yet approved or cleared by the [place] FDA and has been authorized for detection and/or diagnosis of SARS-CoV-2 by FDA under an Emergency Use Authorization (EUA).  This EUA will remain in effect (meaning this test can be used) for the duration  of the COVID-19 declaration under Section 564(b)(1) of the Act, 21 U.S.C. section 360bbb-3(b)(1), unless the authorization is terminated or revoked sooner. Performed at [facility], [place] [address] [place], [place] [address]   Urine culture     Status: Abnormal   Collection Time: 04/19/19  7:12 PM  Result Value Ref Range Status   Specimen Description   Final    URINE, CLEAN CATCH Performed at [facility], [place] [address], [place], [place] [address]    Special Requests   Final    NONE Performed at [facility], [place] [address] [place], [place] [address]    Culture (A)  Final    <10,000 COLONIES/mL INSIGNIFICANT GROWTH Performed at [place] [address], [place], [place] [address]    Report Status 04/21/2019 FINAL  Final  Blood culture (routine x 2)     Status: None (Preliminary result)   Collection Time: 04/19/19  7:12 PM  Result Value Ref Range Status   Specimen Description   Final    BLOOD LEFT HAND Performed at [facility], [place] [address], [place], [place] [address]    Special Requests   Final    BOTTLES DRAWN AEROBIC AND ANAEROBIC Blood Culture results may not be optimal due to an excessive volume of blood received in culture bottles Performed at [facility] [address], [place], [place] [address]    Culture   Final    NO GROWTH 2 DAYS Performed at [facility] [address], [place], [place] [address]    Report Status PENDING  Incomplete  SARS Coronavirus 2 (CEPHEID - Performed in [facility]), Hosp Order     Status: None   Collection Time: 04/19/19  7:12 PM  Result Value Ref Range Status   SARS Coronavirus 2 NEGATIVE NEGATIVE Final    Comment: (NOTE) If result is NEGATIVE SARS-CoV-2 target nucleic acids are NOT DETECTED. The SARS-CoV-2 RNA is generally detectable in upper and lower  respiratory specimens during the acute phase of infection. The lowest   concentration of SARS-CoV-2 viral copies this assay can detect is 250  copies / mL. A negative result does not preclude SARS-CoV-2 infection  and should not be used as the sole basis for treatment or other  patient management decisions.  A negative result may occur with  improper specimen collection / handling, submission of specimen other  than nasopharyngeal swab, presence of viral mutation(s) within the  areas targeted by this assay, and inadequate number of viral copies  (<250 copies / mL). A negative result must be combined with clinical  observations, patient history, and epidemiological information. If result is POSITIVE SARS-CoV-2 target nucleic acids are DETECTED. The SARS-CoV-2 RNA is generally detectable in upper and lower  respiratory specimens dur ing the acute phase of infection.  Positive  results are indicative of active infection with SARS-CoV-2.  Clinical  correlation with patient history and other diagnostic information is  necessary to determine patient infection status.  Positive results do  not rule out bacterial infection or co-infection with other viruses. If result is PRESUMPTIVE POSTIVE SARS-CoV-2 nucleic acids MAY BE PRESENT.   A presumptive positive result was obtained on the submitted specimen  and confirmed on repeat testing.  While 2019 novel coronavirus  (SARS-CoV-2) nucleic acids may be present in the submitted sample  additional confirmatory testing may be necessary for epidemiological  and / or clinical management purposes  to differentiate between  SARS-CoV-2 and other Sarbecovirus currently known to infect humans.  If clinically indicated additional testing with an alternate test  methodology [phone] is advised. The SARS-CoV-2 RNA is generally  detectable in upper and lower respiratory sp ecimens during the acute  phase of infection. The expected result is Negative. Fact Sheet for Patients:  StrictlyIdeas.no Fact Sheet  for Healthcare Providers: BankingDealers.co.za This test is not yet approved or cleared by the [place] FDA and has been authorized for detection and/or diagnosis of SARS-CoV-2 by FDA under an Emergency Use Authorization (EUA).  This EUA will remain in effect (meaning this test can be used) for the duration of the COVID-19 declaration under Section 564(b)(1) of the Act, 21 U.S.C. section 360bbb-3(b)(1), unless the authorization is terminated or revoked sooner. Performed at [facility], [place] [address] [place], [place] [address]   Blood culture (routine x 2)     Status: None (Preliminary result)   Collection Time: 04/19/19  7:17 PM  Result Value Ref Range Status   Specimen Description   Final    BLOOD RIGHT WRIST Performed at [facility], [address], [place], [place] [address]    Special Requests   Final    BOTTLES DRAWN AEROBIC AND ANAEROBIC Blood Culture results may not be optimal due to an excessive volume of blood received in culture bottles Performed at [facility] [address] [place], [place] [address]    Culture   Final    NO GROWTH 2 DAYS Performed at [facility] [address], [place], [place] [address]    Report Status PENDING  Incomplete     Patient was seen and examined on the day of discharge and was found to be in stable condition. Time coordinating discharge: 35 minutes including assessment and coordination of care, as well as examination of the patient.   SIGNED:  Dessa Phi, DO [facility] [email or web address] 04/22/2019, 11:35 AM

## 2019-04-22 NOTE — Progress Notes (Signed)
WEnt over discharge paperwork with patient and daughter over the phone.  Answered all questions.  Did foley and leg bag teaching with teachback.  Supplies given. VSS.  AVS given.   PT wheeled out via Therapist, sports.

## 2019-04-22 NOTE — Progress Notes (Signed)
   Subjective/Chief Complaint:  1 - Cystitis / Prostatitis - admitted with fevers and cystitis. UCX 6/1 e. Coli sens to rocephin, cipro. Has been on rocephin in house with improvement then bridged to PO Cipro. Marland Kitchen BCX negative.  2 - Urinary Retention - foley placed this admission for urinary retention, which he has had before.  Today "Shawan" is stable. No high grade fevers.    Objective: Vital signs in last 24 hours: Temp:  [97.9 F (36.6 C)-99.4 F (37.4 C)] 97.9 F (36.6 C) (06/06 0405) Pulse Rate:  [63-80] 63 (06/06 0405) Resp:  [18-20] 18 (06/06 0405) BP: (129-141)/(57-74) 141/68 (06/06 0405) SpO2:  [93 %-95 %] 94 % (06/06 0405) Last BM Date: 04/20/19  Intake/Output from previous day: 06/05 0701 - 06/06 0700 In: 60 [P.O.:60] Out: 2300 [Urine:2300] Intake/Output this shift: No intake/output data recorded.  General appearance: alert, cooperative and very pleasant Eyes: negative Nose: Nares normal. Septum midline. Mucosa normal. No drainage or sinus tenderness. Throat: lips, mucosa, and tongue normal; teeth and gums normal Neck: supple, symmetrical, trachea midline Back: symmetric, no curvature. ROM normal. No CVA tenderness. Resp: non-labored on room air.  Cardio: Nl rate Male genitalia: normal, foley in place with medium yellow urine that is non-foul.  Extremities: extremities normal, atraumatic, no cyanosis or edema Pulses: 2+ and symmetric Neurologic: Grossly normal  Lab Results:  Recent Labs    04/20/19 0235 04/22/19 0543  WBC 6.2 5.9  HGB 11.9* 12.7*  HCT 37.2* 40.8  PLT 96* 125*   BMET Recent Labs    04/20/19 0235 04/22/19 0543  NA 137 138  K 3.5 3.9  CL 107 106  CO2 24 27  GLUCOSE 118* 100*  BUN 10 13  CREATININE 0.84 0.87  CALCIUM 8.0* 8.3*   PT/INR No results for input(s): LABPROT, INR in the last 72 hours. ABG No results for input(s): PHART, HCO3 in the last 72 hours.  Invalid input(s): PCO2, PO2  Studies/Results: No results  found.  Anti-infectives: Anti-infectives (From admission, onward)   Start     Dose/Rate Route Frequency Ordered Stop   04/21/19 1800  ciprofloxacin (CIPRO) tablet 500 mg     500 mg Oral 2 times daily 04/21/19 1328     04/20/19 2000  cefTRIAXone (ROCEPHIN) 1 g in sodium chloride 0.9 % 100 mL IVPB  Status:  Discontinued     1 g 200 mL/hr over 30 Minutes Intravenous Every 24 hours 04/20/19 0011 04/21/19 1245   04/19/19 2130  cefTRIAXone (ROCEPHIN) 2 g in sodium chloride 0.9 % 100 mL IVPB     2 g 200 mL/hr over 30 Minutes Intravenous  Once 04/19/19 2123 04/19/19 2225      Assessment/Plan:  1 - Cystitis / Prostatitis - improving clinically. I feel OK to continue PO Cipro at discharge for 10-14day total course therapy.   2 - Urinary Retention - likley from prostate / bladder neck swelling from infection. Keep current foley.  We will arrange Urology followup for voiding trial / foley removal in about a week.  OK for DC from GU perspective anytime. Please call anytime with questions. Appreciate hospitalist team co-management.    Alexis Frock 04/22/2019

## 2019-04-25 LAB — CULTURE, BLOOD (ROUTINE X 2)
Culture: NO GROWTH
Culture: NO GROWTH

## 2019-05-11 NOTE — Progress Notes (Signed)
    Virtual Visit via Telephone Note The purpose of this virtual visit is to provide medical care while limiting exposure to the novel coronavirus.    Consent was obtained for phone visit:  Yes.   Answered questions that patient had about telehealth interaction:  Yes.   I discussed the limitations, risks, security and privacy concerns of performing an evaluation and management service by telephone. I also discussed with the patient that there may be a patient responsible charge related to this service. The patient expressed understanding and agreed to proceed.  Pt location: Home Physician Location: home Name of referring provider:  Shon Baton, MD I connected with .Jeremiah Zimmerman at patients initiation/request on 05/12/2019 at  2:00 PM EDT by telephone and verified that I am speaking with the correct person using two identifiers.  Pt MRN:  [id number] Pt DOB:  August 17, 1934  Participants:  Jeremiah Zimmerman;  Daughter supplements history   History of Present Illness:  Patient seen today in follow-up, accompanied by his daughter who supplements history.  Last visit, I changed the patient from immediate release carbidopa levodopa to carbidopa/levodopa 25/100 CR, 1 tablet 3 times per day.  Reports that he is tolerating it well.  Medical records have been reviewed since our last visit.  Patient was in the hospital in early June for sepsis secondary to E. coli UTI and acute prostatitis.  Records did indicate that tremor got much worse when he was in the hospital.  Pt denies that it got worse in the hospital.    Current Meds  Medication Sig  . Carbidopa-Levodopa ER (SINEMET CR) 25-100 MG tablet controlled release Take 1 tablet by mouth 3 (three) times daily.  Marland Kitchen docusate sodium (COLACE) 100 MG capsule Take 1 capsule (100 mg total) by mouth 2 (two) times daily as needed for mild constipation.  . polyethylene glycol (MIRALAX) packet Take 17 g by mouth daily as needed for mild constipation.  . Tamsulosin HCl  (FLOMAX) 0.4 MG CAPS Take 0.4 mg by mouth daily.       Observations/Objective:    Vitals:   05/12/19 1257  Weight: 155 lb (70.3 kg)  Height: 5\' 10"  (1.778 m)     Assessment and Plan:   1.  Parkinson's disease, diagnosed January 19, 2019  -Continue carbidopa/levodopa 25/100 CR, 1 tablet 3 times per day.  Did not tolerate IR well.  We discussed increasing this in dose vs frequency and ultimately he decided to continue with the medication as is.  -discussed social distancing/masking.  Pt not really going out.  Daughter is staying with him right now to help and even she is masking in the home.  2.  Constipation  -Managing with diet and hydration.  3.  REM behavior disorder  -Room safety discussed.  Follow Up Instructions:  5-6 months  -I discussed the assessment and treatment plan with the patient. The patient was provided an opportunity to ask questions and all were answered. The patient agreed with the plan and demonstrated an understanding of the instructions.   The patient was advised to call back or seek an in-person evaluation if the symptoms worsen or if the condition fails to improve as anticipated.    Total Time spent in visit with the patient was:  12 min, of which 100% of the time was spent in counseling and/or coordinating care on safety.   Pt understands and agrees with the plan of care outlined.     Alonza Bogus, DO

## 2019-05-12 ENCOUNTER — Other Ambulatory Visit: Payer: Self-pay

## 2019-05-12 ENCOUNTER — Telehealth (INDEPENDENT_AMBULATORY_CARE_PROVIDER_SITE_OTHER): Payer: Medicare Other | Admitting: Neurology

## 2019-05-12 ENCOUNTER — Encounter: Payer: Self-pay | Admitting: Neurology

## 2019-05-12 DIAGNOSIS — G20 Parkinson's disease: Secondary | ICD-10-CM | POA: Diagnosis not present

## 2019-06-01 ENCOUNTER — Ambulatory Visit: Payer: Medicare Other | Admitting: Neurology

## 2019-07-17 ENCOUNTER — Other Ambulatory Visit: Payer: Self-pay | Admitting: Neurology

## 2019-07-17 NOTE — Telephone Encounter (Signed)
Requested Prescriptions   Pending Prescriptions Disp Refills  . carbidopa-levodopa (SINEMET IR) 25-100 MG tablet [Pharmacy Med Name: CARBIDOPA-LEVODOPA 25-100 TAB] 270 tablet 1    Sig: TAKE 1 TABLET BY MOUTH THREE TIMES A DAY   Rx last filled: 03/14/19 #270 1 refills  Pt last seen:05/12/19  Follow up appt scheduled:10/19/19

## 2019-08-08 IMAGING — DX ABDOMEN - 1 VIEW
1 series · 1 of 1 positions shown · non-contrast
Comparison: 07/18/2018

CLINICAL DATA: Abdominal distension.  Abdominal pain.

EXAM:
ABDOMEN - 1 VIEW

[abdomen kub]
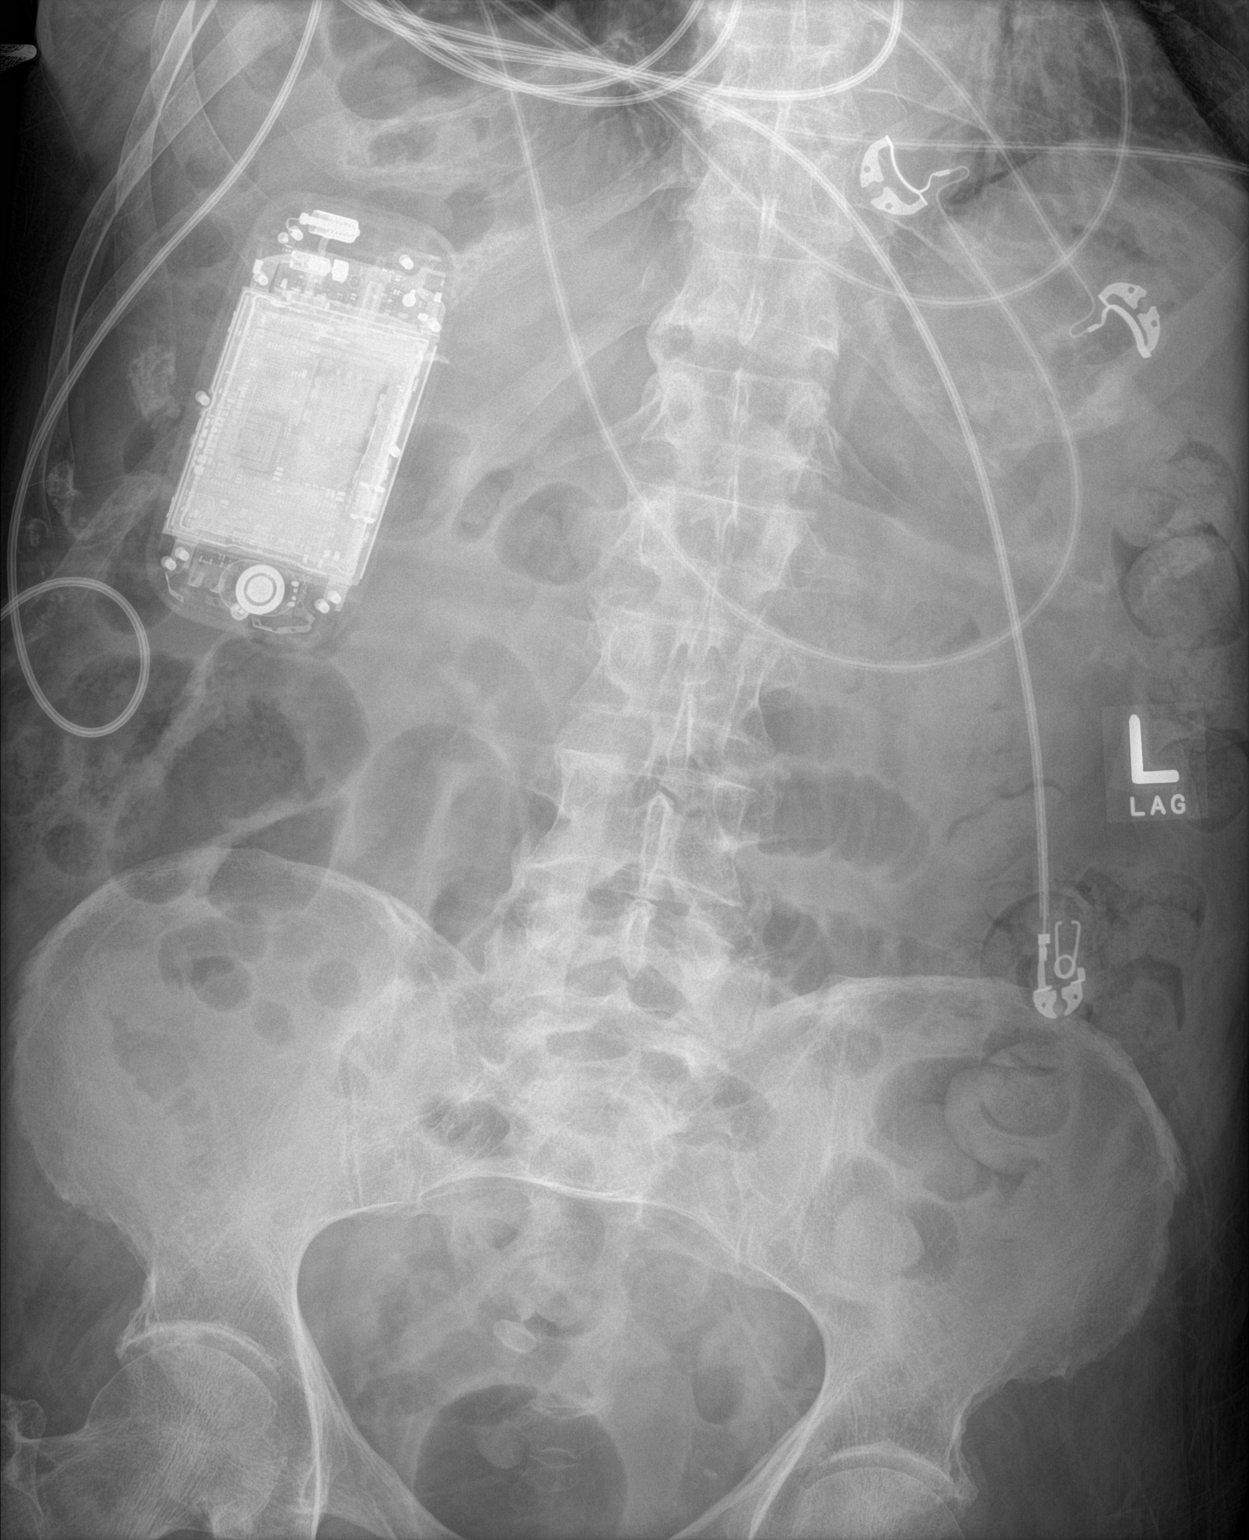

[1 of 1 positions shown; findings below may reference images not displayed]

FINDINGS: There is mild gaseous distention of several loops of small bowel
scattered throughout the abdomen. These measure up to approximately
2.6 cm. There is no pneumatosis. No free air. A metallic electronic
device projects over the right upper quadrant and obscures portions
of the abdomen. There is a moderate amount of stool throughout the
colon.
IMPRESSION: 1. Nonobstructive bowel gas pattern.
2. Moderate amount of stool in the colon.

## 2019-09-06 ENCOUNTER — Other Ambulatory Visit: Payer: Self-pay | Admitting: Neurology

## 2019-10-10 ENCOUNTER — Encounter: Payer: Self-pay | Admitting: Neurology

## 2019-10-18 ENCOUNTER — Encounter: Payer: Self-pay | Admitting: Neurology

## 2019-10-18 NOTE — Progress Notes (Signed)
    Virtual Visit via Telephone Note The purpose of this virtual visit is to provide medical care while limiting exposure to the novel coronavirus.    Consent was obtained for phone visit:  Yes.   Answered questions that patient had about telehealth interaction:  Yes.   I discussed the limitations, risks, security and privacy concerns of performing an evaluation and management service by telephone. I also discussed with the patient that there may be a patient responsible charge related to this service. The patient expressed understanding and agreed to proceed.  Pt location: Home Physician Location: office Name of referring provider:  Shon Baton, MD I connected with .Jeremiah Zimmerman at patients initiation/request on 10/19/2019 at  3:30 PM EST by telephone and verified that I am speaking with the correct person using two identifiers.  Pt MRN:  [id number] Pt DOB:  1933/12/29  Participants:  Jeremiah Zimmerman;  Daughter supplements history   History of Present Illness:  Patient seen today in follow-up for Parkinson's disease.  His daughter is on the phone call and supplements history.  Patient is on carbidopa/levodopa 25/100 CR, 1 tablet 3 times per day.  Pt denies falls.  Pt denies lightheadedness, near syncope.  No hallucinations.  Mood has been good.  Using a exercise machine/bike for exercise.  Daughter is getting groceries for him and his wife.  Using miralax for constipation.  No issues with dreaming/rbd   Current Outpatient Medications:  .  Carbidopa-Levodopa ER (SINEMET CR) 25-100 MG tablet controlled release, TAKE 1 TABLET BY MOUTH THREE TIMES A DAY, Disp: 270 tablet, Rfl: 1 .  polyethylene glycol (MIRALAX) packet, Take 17 g by mouth daily as needed for mild constipation., Disp: , Rfl: 0 .  Tamsulosin HCl (FLOMAX) 0.4 MG CAPS, Take 0.4 mg by mouth daily. , Disp: , Rfl:     Observations/Objective:    There were no vitals filed for this visit.   Assessment and Plan:   1.  Parkinson's  disease, diagnosed January 19, 2019  -Continue carbidopa/levodopa 25/100 CR, 1 tablet 3 times per day.  Did not tolerate IR well.  We discussed increasing this in dose vs frequency and ultimately he decided to continue with the medication as is.  -discussed social distancing/masking.   2.  Constipation  -on mirlax  3.  REM behavior disorder  -no problems right now  Follow Up Instructions:  5-6 months  -I discussed the assessment and treatment plan with the patient. The patient was provided an opportunity to ask questions and all were answered. The patient agreed with the plan and demonstrated an understanding of the instructions.   The patient was advised to call back or seek an in-person evaluation if the symptoms worsen or if the condition fails to improve as anticipated.    Total Time spent in visit with the patient was:  8 min, of which 100% of the time was spent in counseling and/or coordinating care on safety.   Pt understands and agrees with the plan of care outlined.     Alonza Bogus, DO

## 2019-10-19 ENCOUNTER — Telehealth (INDEPENDENT_AMBULATORY_CARE_PROVIDER_SITE_OTHER): Payer: Medicare Other | Admitting: Neurology

## 2019-10-19 ENCOUNTER — Ambulatory Visit: Payer: Medicare Other | Admitting: Neurology

## 2019-10-19 ENCOUNTER — Other Ambulatory Visit: Payer: Self-pay

## 2019-10-19 DIAGNOSIS — G20 Parkinson's disease: Secondary | ICD-10-CM | POA: Diagnosis not present

## 2019-12-11 ENCOUNTER — Telehealth: Payer: Self-pay | Admitting: Hematology

## 2019-12-11 NOTE — Telephone Encounter (Signed)
Created in error

## 2019-12-13 ENCOUNTER — Telehealth: Payer: Self-pay | Admitting: Hematology

## 2019-12-13 ENCOUNTER — Other Ambulatory Visit: Payer: Self-pay

## 2019-12-13 ENCOUNTER — Inpatient Hospital Stay: Payer: Medicare Other | Attending: Hematology | Admitting: Hematology

## 2019-12-13 ENCOUNTER — Inpatient Hospital Stay: Payer: Medicare Other

## 2019-12-13 VITALS — BP 137/64 | HR 76 | Temp 97.8°F | Resp 20 | Ht 69.0 in | Wt 159.9 lb

## 2019-12-13 DIAGNOSIS — K5900 Constipation, unspecified: Secondary | ICD-10-CM | POA: Insufficient documentation

## 2019-12-13 DIAGNOSIS — Z880 Allergy status to penicillin: Secondary | ICD-10-CM | POA: Insufficient documentation

## 2019-12-13 DIAGNOSIS — K56609 Unspecified intestinal obstruction, unspecified as to partial versus complete obstruction: Secondary | ICD-10-CM | POA: Insufficient documentation

## 2019-12-13 DIAGNOSIS — Z882 Allergy status to sulfonamides status: Secondary | ICD-10-CM | POA: Insufficient documentation

## 2019-12-13 DIAGNOSIS — Z881 Allergy status to other antibiotic agents status: Secondary | ICD-10-CM | POA: Diagnosis not present

## 2019-12-13 DIAGNOSIS — G20 Parkinson's disease: Secondary | ICD-10-CM | POA: Diagnosis not present

## 2019-12-13 DIAGNOSIS — D696 Thrombocytopenia, unspecified: Secondary | ICD-10-CM

## 2019-12-13 DIAGNOSIS — Z818 Family history of other mental and behavioral disorders: Secondary | ICD-10-CM | POA: Insufficient documentation

## 2019-12-13 DIAGNOSIS — Z87440 Personal history of urinary (tract) infections: Secondary | ICD-10-CM | POA: Diagnosis not present

## 2019-12-13 DIAGNOSIS — Z808 Family history of malignant neoplasm of other organs or systems: Secondary | ICD-10-CM | POA: Insufficient documentation

## 2019-12-13 DIAGNOSIS — Z8719 Personal history of other diseases of the digestive system: Secondary | ICD-10-CM | POA: Insufficient documentation

## 2019-12-13 DIAGNOSIS — Z8551 Personal history of malignant neoplasm of bladder: Secondary | ICD-10-CM | POA: Insufficient documentation

## 2019-12-13 DIAGNOSIS — Z800 Family history of malignant neoplasm of digestive organs: Secondary | ICD-10-CM | POA: Diagnosis not present

## 2019-12-13 DIAGNOSIS — Z79899 Other long term (current) drug therapy: Secondary | ICD-10-CM | POA: Diagnosis not present

## 2019-12-13 DIAGNOSIS — R5383 Other fatigue: Secondary | ICD-10-CM | POA: Diagnosis not present

## 2019-12-13 DIAGNOSIS — Z803 Family history of malignant neoplasm of breast: Secondary | ICD-10-CM | POA: Diagnosis not present

## 2019-12-13 DIAGNOSIS — Z8049 Family history of malignant neoplasm of other genital organs: Secondary | ICD-10-CM | POA: Diagnosis not present

## 2019-12-13 DIAGNOSIS — Z9049 Acquired absence of other specified parts of digestive tract: Secondary | ICD-10-CM | POA: Insufficient documentation

## 2019-12-13 DIAGNOSIS — Z8042 Family history of malignant neoplasm of prostate: Secondary | ICD-10-CM | POA: Insufficient documentation

## 2019-12-13 LAB — CMP (CANCER CENTER ONLY)
ALT: <6 U/L (ref 0–44)
AST: 14 U/L — ABNORMAL LOW (ref 15–41)
Albumin: 4.4 g/dL (ref 3.5–5.0)
Alkaline Phosphatase: 84 U/L (ref 38–126)
Anion gap: 8 (ref 5–15)
BUN: 18 mg/dL (ref 8–23)
CO2: 29 mmol/L (ref 22–32)
Calcium: 9.5 mg/dL (ref 8.9–10.3)
Chloride: 105 mmol/L (ref 98–111)
Creatinine: 0.98 mg/dL (ref 0.61–1.24)
GFR, Est AFR Am: >60 mL/min (ref >60)
GFR, Estimated: >60 mL/min (ref >60)
Glucose, Bld: 100 mg/dL — ABNORMAL HIGH (ref 70–99)
Potassium: 4.3 mmol/L (ref 3.5–5.1)
Sodium: 142 mmol/L (ref 135–145)
Total Bilirubin: 1 mg/dL (ref 0.3–1.2)
Total Protein: 7.8 g/dL (ref 6.5–8.1)

## 2019-12-13 LAB — CBC WITH DIFFERENTIAL/PLATELET
Abs Immature Granulocytes: 0.03 10*3/uL (ref 0.00–0.07)
Basophils Absolute: 0 10*3/uL (ref 0.0–0.1)
Basophils Relative: 0 %
Eosinophils Absolute: 0.1 10*3/uL (ref 0.0–0.5)
Eosinophils Relative: 2 %
HCT: 48.6 % (ref 39.0–52.0)
Hemoglobin: 16 g/dL (ref 13.0–17.0)
Immature Granulocytes: 1 %
Lymphocytes Relative: 24 %
Lymphs Abs: 1.5 10*3/uL (ref 0.7–4.0)
MCH: 30.8 pg (ref 26.0–34.0)
MCHC: 32.9 g/dL (ref 30.0–36.0)
MCV: 93.5 fL (ref 80.0–100.0)
Monocytes Absolute: 0.5 10*3/uL (ref 0.1–1.0)
Monocytes Relative: 8 %
Neutro Abs: 4.2 10*3/uL (ref 1.7–7.7)
Neutrophils Relative %: 65 %
Platelets: 136 10*3/uL — ABNORMAL LOW (ref 150–400)
RBC: 5.2 MIL/uL (ref 4.22–5.81)
RDW: 12.6 % (ref 11.5–15.5)
WBC: 6.4 10*3/uL (ref 4.0–10.5)
nRBC: 0 % (ref 0.0–0.2)

## 2019-12-13 LAB — VITAMIN B12: Vitamin B-12: 662 pg/mL (ref 180–914)

## 2019-12-13 LAB — LACTATE DEHYDROGENASE: LDH: 169 U/L (ref 98–192)

## 2019-12-13 LAB — PLATELET BY CITRATE

## 2019-12-13 LAB — IMMATURE PLATELET FRACTION: Immature Platelet Fraction: 10 % — ABNORMAL HIGH (ref 1.2–8.6)

## 2019-12-13 NOTE — Progress Notes (Signed)
HEMATOLOGY/ONCOLOGY CONSULTATION NOTE  Date of Service: 12/13/2019  Patient Care Team: Shon Baton, MD as PCP - General (Internal Medicine)  CHIEF COMPLAINTS/PURPOSE OF CONSULTATION:  Thrombocytopenia  HISTORY OF PRESENTING ILLNESS:   Jeremiah Zimmerman is a wonderful 86 y.o. male who has been referred to [place] by Dr Shon Baton for evaluation and management of thrombocytopenia. Pt is accompanied today by his daughter, Jeremiah Zimmerman. The pt reports that he is doing well overall.   The pt reports that he was hospitalized in June for a severe UTI infection. He did not require any blood transfusions during his hospitalization. After discharge he began taking low-dose Keflex and continued until October. He also began to take probiotics around this time but discontinued soon after. Dr. Jeffie Pollock is currently following him for his Hx of UTI, Bladder cancer. Pt was diagnosed with Parkinson's in March 2020 and began taking Sinemet. He has no other recent medication changes. Pt is not currently taking any OTC medications, herbs, or supplements. He denies any recent diarrhea. Pt has been tired lately and is having to lay down for several hours during the day. Pt has not noticed an improvement in energy levels or other Parkinson's symptoms since beginning Sinemet. He has not had any unexpected weight loss. He has varicose veins that are not very bothersome.   There has previously been some blood found in his urine. His last urine test was in October. Pt denies any other bleeding concerns. His labs on 12/05/2019 were done as a part of a 6 month check-up. Pt has not yet registered for the COVID19 vaccine.   Most recent lab results (12/06/2019) of CBC w/diff is as follows: all values are WNL except for RDW at 11.2, PLT at 64K, MPV at 11.2. PLT at 16K on 12/05/2019   On review of systems, pt reports constipation, fatigue and denies diarrhea, nose bleeds, gum bleeds, other bleeding concerns, sore throat, runny nose, fevers,  chills, unexpected weight loss, back pain, dysuria, testicular pain/swelling and any other symptoms.   On PMHx the pt reports Cholecystectomy, Bladder cancer, Parkinson's disease, SBO.  MEDICAL HISTORY:  Past Medical History:  Diagnosis Date  . Adenomatous colon polyp   . Bladder cancer ([place]) 2010  . BPH (benign prostatic hyperplasia)   . Heart murmur   . Hyperlipidemia   . Hypertension   . MVP (mitral valve prolapse) 01-13-13   hx. LBBB previous EKG 11'11  in Epic.  Marland Kitchen Parkinson's disease ([place])   . SBO (small bowel obstruction) ([place]) 07/18/2018  . Seasonal allergies 01-13-13    SURGICAL HISTORY: Past Surgical History:  Procedure Laterality Date  . APPENDECTOMY    . BLADDER SURGERY  2010   bladder cancer in 2010/Post surgery bleed  . CARDIAC CATHETERIZATION  01-13-13   greater than 5 yrs ago-negative findings  . CHOLECYSTECTOMY N/A 01/27/2017   Procedure: LAPAROSCOPIC CHOLECYSTECTOMY WITH INTRAOPERATIVE CHOLANGIOGRAM;  Surgeon: Rolm Bookbinder, MD;  Location: [place];  Service: General;  Laterality: N/A;  . COLONOSCOPY  01-13-13   hx.colon polyps excised-last 3-4 yrs ago  . ELBOW SURGERY     left  . KNEE ARTHROSCOPY WITH MEDIAL MENISECTOMY Right 01/20/2013   Procedure: KNEE ARTHROSCOPY WITH MEDIAL MENISECTOMY;  Surgeon: Gearlean Alf, MD;  Location: [facility];  Service: Orthopedics;  Laterality: Right;  right knee arthroscopy with medial meniscal debridement and chrondroplasty  . TONSILLECTOMY    . TONSILLECTOMY AND ADENOIDECTOMY      SOCIAL HISTORY: Social History   Socioeconomic History  .  Marital status: Married    Spouse name: Not on file  . Number of children: Not on file  . Years of education: Not on file  . Highest education level: Not on file  Occupational History  . Not on file  Tobacco Use  . Smoking status: Never Smoker  . Smokeless tobacco: Never Used  Substance and Sexual Activity  . Alcohol use: No  . Drug use: No  . Sexual activity: Not Currently  Other  Topics Concern  . Not on file  Social History Narrative  . Not on file   Social Determinants of Health   Financial Resource Strain:   . Difficulty of Paying Living Expenses: Not on file  Food Insecurity:   . Worried About Charity fundraiser in the Last Year: Not on file  . Ran Out of Food in the Last Year: Not on file  Transportation Needs:   . Lack of Transportation (Medical): Not on file  . Lack of Transportation (Non-Medical): Not on file  Physical Activity:   . Days of Exercise per Week: Not on file  . Minutes of Exercise per Session: Not on file  Stress:   . Feeling of Stress : Not on file  Social Connections:   . Frequency of Communication with Friends and Family: Not on file  . Frequency of Social Gatherings with Friends and Family: Not on file  . Attends Religious Services: Not on file  . Active Member of Clubs or Organizations: Not on file  . Attends Archivist Meetings: Not on file  . Marital Status: Not on file  Intimate Partner Violence:   . Fear of Current or Ex-Partner: Not on file  . Emotionally Abused: Not on file  . Physically Abused: Not on file  . Sexually Abused: Not on file    FAMILY HISTORY: Family History  Problem Relation Age of Onset  . Breast cancer Mother   . Prostate cancer Father   . Colon cancer Brother   . Cervical cancer Daughter   . Thyroid cancer Daughter   . Uterine cancer Daughter   . Prostate cancer Brother   . Alzheimer's disease Sister   . Alzheimer's disease Sister   . Healthy Son     ALLERGIES:  is allergic to erythromycin; nitroglycerin; sulfa antibiotics; tetracyclines & related; amoxicillin; and penicillins.  MEDICATIONS:  Current Outpatient Medications  Medication Sig Dispense Refill  . Carbidopa-Levodopa ER (SINEMET CR) 25-100 MG tablet controlled release TAKE 1 TABLET BY MOUTH THREE TIMES A DAY 270 tablet 1  . docusate sodium (COLACE) 100 MG capsule Take 100 mg by mouth 3 (three) times daily.    .  polyethylene glycol (MIRALAX) packet Take 17 g by mouth daily as needed for mild constipation.  0  . Tamsulosin HCl (FLOMAX) 0.4 MG CAPS Take 0.4 mg by mouth daily.      No current facility-administered medications for this visit.    REVIEW OF SYSTEMS:    10 Point review of Systems was done is negative except as noted above.  PHYSICAL EXAMINATION: ECOG PERFORMANCE STATUS: 2 - Symptomatic, <50% confined to bed  . Vitals:   12/13/19 1024  BP: 137/64  Pulse: 76  Resp: 20  Temp: 97.8 F (36.6 C)  SpO2: 99%   Filed Weights   12/13/19 1024  Weight: 159 lb 14.4 oz (72.5 kg)   .Body mass index is 23.61 kg/m.  GENERAL:alert, in no acute distress and comfortable SKIN: no acute rashes, no significant lesions EYES:  conjunctiva are pink and non-injected, sclera anicteric OROPHARYNX: MMM, no exudates, no oropharyngeal erythema or ulceration NECK: supple, no JVD LYMPH:  no palpable lymphadenopathy in the cervical, axillary or inguinal regions LUNGS: clear to auscultation b/l with normal respiratory effort HEART: regular rate & rhythm ABDOMEN:  normoactive bowel sounds, not distended. minimal tenderness to palpation over LLQ, likely due to constipation - no rebounding or rigidity Extremity: no pedal edema PSYCH: alert & oriented x 3 with fluent speech NEURO: no focal motor/sensory deficits  LABORATORY DATA:  I have reviewed the data as listed  . CBC Latest Ref Rng & Units 12/13/2019 12/13/2019 04/22/2019  WBC 4.0 - 10.5 K/uL 6.4 - 5.9  Hemoglobin 13.0 - 17.0 g/dL 16.0 - 12.7(L)  Hematocrit 37.5 - 51.0 % 48.6 46.9 40.8  Platelets 150 - 400 K/uL 136(L) - 125(L)    . CMP Latest Ref Rng & Units 12/13/2019 04/22/2019 04/20/2019  Glucose 70 - 99 mg/dL 100(H) 100(H) 118(H)  BUN 8 - 23 mg/dL 18 13 10   Creatinine 0.61 - 1.24 mg/dL 0.98 0.87 0.84  Sodium 135 - 145 mmol/L 142 138 137  Potassium 3.5 - 5.1 mmol/L 4.3 3.9 3.5  Chloride 98 - 111 mmol/L 105 106 107  CO2 22 - 32 mmol/L 29 27 24     Calcium 8.9 - 10.3 mg/dL 9.5 8.3(L) 8.0(L)  Total Protein 6.5 - 8.1 g/dL 7.8 - 5.8(L)  Total Bilirubin 0.3 - 1.2 mg/dL 1.0 - 0.7  Alkaline Phos 38 - 126 U/L 84 - 65  AST 15 - 41 U/L 14(L) - 21  ALT 0 - 44 U/L <6 - 23     RADIOGRAPHIC STUDIES: I have personally reviewed the radiological images as listed and agreed with the findings in the report. No results found.  ASSESSMENT & PLAN:   86 yo with   1) Thrombocytopenia PLAN: -Discussed patient's most recent labs from 12/06/2019, all values are WNL except for RDW at 11.2, PLT at 64K, MPV at 11.2. -Discussed 12/05/2019 PLT at 16K -Thrombocytopenia appears to be acute, possibly from an infection -Advised pt that pseudothrombocytopenia can lead to an incorrect reading of PLT -Advised pt that ITP is a possiblity -Advised pt that long-term antibiotics can affect nutrient absorption leading to vitamin deficiencies, which can affect PLT formation -Advised pt that if PLT >50K we would not move to treat, but would watch with labs  -Advised pt that if PLT <50K would increase the risk of traumatic bleeding and PLT<20K would increase the risk of spontaneous bleeding -Will get labs today -Will see back as needed based on labs   FOLLOW UP: Labs today RTC with Dr Irene Limbo as needed based on labs  All of the patients questions were answered with apparent satisfaction. The patient knows to call the clinic with any problems, questions or concerns.  I spent 30 mins counseling the patient face to face. The total time spent in the appointment was 45 minutes and more than 50% was on counseling and direct patient cares.    Sullivan Lone MD [place] AAHIVMS [facility] Hematology/Oncology Physician [facility]  (Office):       [phone] (Work cell):  [phone] (Fax):           [phone]  12/13/2019 1:12 PM  I, Yevette Edwards, am acting as a scribe for Dr. Sullivan Lone.   .I have reviewed the above documentation for accuracy and  completeness, and I agree with the above. Brunetta Genera MD  Addendum  Platelet counts near normal at 136k. Likely element of pseudothrombocytopenia from platelet clumping. - no further workup recommended. -would recommend confirm future thrombocytopenia with citrate tube to confirm accuracy of platelet counts.  Component     Latest Ref Rng & Units 12/13/2019 12/13/2019 12/13/2019        11:24 AM 11:24 AM 11:25 AM  WBC     4.0 - 10.5 K/uL 6.4    RBC     4.22 - 5.81 MIL/uL 5.20    Hemoglobin     13.0 - 17.0 g/dL 16.0    HCT     39.0 - 52.0 % 48.6 46.9   MCV     80.0 - 100.0 fL 93.5    MCH     26.0 - 34.0 pg 30.8    MCHC     30.0 - 36.0 g/dL 32.9    RDW     11.5 - 15.5 % 12.6    Platelets     150 - 400 K/uL 136 (L)    nRBC     0.0 - 0.2 % 0.0    Neutrophils     % 65    NEUT#     1.7 - 7.7 K/uL 4.2    Lymphocytes     % 24    Lymphocyte #     0.7 - 4.0 K/uL 1.5    Monocytes Relative     % 8    Monocyte #     0.1 - 1.0 K/uL 0.5    Eosinophil     % 2    Eosinophils Absolute     0.0 - 0.5 K/uL 0.1    Basophil     % 0    Basophils Absolute     0.0 - 0.1 K/uL 0.0    Immature Granulocytes     % 1    Abs Immature Granulocytes     0.00 - 0.07 K/uL 0.03    Sodium     135 - 145 mmol/L 142    Potassium     3.5 - 5.1 mmol/L 4.3    Chloride     98 - 111 mmol/L 105    CO2     22 - 32 mmol/L 29    Glucose     70 - 99 mg/dL 100 (H)    BUN     8 - 23 mg/dL 18    Creatinine     0.61 - 1.24 mg/dL 0.98    Calcium     8.9 - 10.3 mg/dL 9.5    Total Protein     6.5 - 8.1 g/dL 7.8    Albumin     3.5 - 5.0 g/dL 4.4    AST     15 - 41 U/L 14 (L)    ALT     0 - 44 U/L <6    Alkaline Phosphatase     38 - 126 U/L 84    Total Bilirubin     0.3 - 1.2 mg/dL 1.0    GFR, Est Non African American     >60 mL/min >60    GFR, Est African American     >60 mL/min >60    Anion gap     5 - 15 8    Folate, Hemolysate     Not Estab. ng/mL  415.0   Folate, RBC      >498 ng/mL  885   Vitamin B12  180 - 914 pg/mL 662    Immature Platelet Fraction     1.2 - 8.6 % 10.0 (H)    LDH     98 - 192 U/L   169

## 2019-12-13 NOTE — Telephone Encounter (Signed)
Opened by accident. Please disregard.  °

## 2019-12-13 NOTE — Telephone Encounter (Signed)
No new los during check out. 

## 2019-12-14 LAB — FOLATE RBC
Folate, Hemolysate: 415 ng/mL
Folate, RBC: 885 ng/mL (ref >498)
Hematocrit: 46.9 % (ref 37.5–51.0)

## 2019-12-19 ENCOUNTER — Encounter: Payer: Medicare Other | Admitting: Hematology

## 2020-03-07 ENCOUNTER — Other Ambulatory Visit: Payer: Self-pay | Admitting: Neurology

## 2020-04-18 ENCOUNTER — Ambulatory Visit: Payer: Medicare Other | Admitting: Neurology

## 2020-05-21 ENCOUNTER — Ambulatory Visit: Payer: Medicare Other | Admitting: Neurology

## 2020-05-21 ENCOUNTER — Other Ambulatory Visit: Payer: Self-pay

## 2020-05-21 ENCOUNTER — Encounter: Payer: Self-pay | Admitting: Neurology

## 2020-05-21 VITALS — BP 143/75 | HR 77 | Ht 70.0 in | Wt 162.0 lb

## 2020-05-21 DIAGNOSIS — G20 Parkinson's disease: Secondary | ICD-10-CM

## 2020-05-21 MED ORDER — CARBIDOPA-LEVODOPA ER 25-100 MG PO TBCR: 1.0000 | EXTENDED_RELEASE_TABLET | Freq: Three times a day (TID) | ORAL | 1 refills | Status: DC

## 2020-05-21 NOTE — Progress Notes (Signed)
Assessment/Plan:   1.  Parkinsons Disease  -Continue carbidopa/levodopa 25/100 CR, 1 tablet 3 times per day  -gets up in the middle of the night to use RR frequently and asked him if we should add carbidopa/levodopa CR at bed but he doesn't think that this is needed  -taking lengthy naps.  Told him to avoid nap after 2 pm and avoid longer than 1 hour.  Discussed regular daily schedule  -increase exercise  -offered PT but declined for now  2.  RBD  -No medication required right now.  Safety discussed.  3.  Follow up is anticipated in the next 4-6 months, sooner should new neurologic issues arise.     Subjective:   Jeremiah Zimmerman was seen today in follow up for Parkinsons disease.  My previous records were reviewed prior to todays visit as well as outside records available to me.  Patient accompanied by his daughter.  supplements the history.  Pt denies falls.  Pt denies lightheadedness, near syncope.  No hallucinations.  Mood has been good.  Feels generally weak - when asked what that means he states that this means lack of energy.  Doing good in terms of RBD.  No falling out of the bed.  Pt denies acting out of the dreams but daughter thinks he does occasionally.    Current prescribed movement disorder medications: Carbidopa/levodopa 25/100 CR, 1 tablet 3 times per day (AM, 1pm, 7-8pm)   ALLERGIES:   Allergies  Allergen Reactions  . Erythromycin Nausea Only    REACTION: Intolerance to this medication with upset stomach  . Nitroglycerin Other (See Comments)    REACTION: Intolerance to this medication.  It drops his BP too low and quickly.  . Sulfa Antibiotics Nausea Only and Rash  . Tetracyclines & Related Nausea Only  . Amoxicillin Diarrhea and Nausea Only  . Penicillins Swelling and Rash    Has patient had a PCN reaction causing immediate rash, facial/tongue/throat swelling, SOB or lightheadedness with hypotension: Yes Has patient had a PCN reaction causing severe rash  involving mucus membranes or skin necrosis: No Has patient had a PCN reaction that required hospitalization: No Has patient had a PCN reaction occurring within the last 10 years: No If all of the above answers are "NO", then may proceed with Cephalosporin use.   REACTION: Patient develops a rash and swelling at inject    CURRENT MEDICATIONS:  Outpatient Encounter Medications as of 05/21/2020  Medication Sig  . Carbidopa-Levodopa ER (SINEMET CR) 25-100 MG tablet controlled release TAKE 1 TABLET BY MOUTH THREE TIMES A DAY  . docusate sodium (COLACE) 100 MG capsule Take 100 mg by mouth 3 (three) times daily.  . polyethylene glycol (MIRALAX) packet Take 17 g by mouth daily as needed for mild constipation.  . Tamsulosin HCl (FLOMAX) 0.4 MG CAPS Take 0.4 mg by mouth daily.    No facility-administered encounter medications on file as of 05/21/2020.    Objective:   PHYSICAL EXAMINATION:    VITALS:   Vitals:   05/21/20 1053  BP: (!) 143/75  Pulse: 77  SpO2: 95%  Weight: 162 lb (73.5 kg)  Height: 5\' 10"  (1.778 m)    GEN:  The patient appears stated age and is in NAD. HEENT:  Normocephalic, atraumatic.  The mucous membranes are moist. The superficial temporal arteries are without ropiness or tenderness. CV:  RRR Lungs:  CTAB Neck/HEME:  There are no carotid bruits bilaterally.  Neurological examination:  Orientation: The patient is  alert and oriented x3. Cranial nerves: There is good facial symmetry with min facial hypomimia. The speech is fluent and clear. Soft palate rises symmetrically and there is no tongue deviation. Hearing is intact to conversational tone. Sensation: Sensation is intact to light touch throughout Motor: Strength is at least antigravity x4.  Movement examination: Tone: There is mild increased tone in the LUE Abnormal movements: there is intermittent RUE rest tremor Coordination:  There is min decremation with RAM's, with finger taps on the L today Gait and  Station: The patient has no difficulty arising out of a deep-seated chair without the use of the hands. The patient's stride length is good.    I have reviewed and interpreted the following labs independently    Chemistry      Component Value Date/Time   NA 142 12/13/2019 1124   K 4.3 12/13/2019 1124   CL 105 12/13/2019 1124   CO2 29 12/13/2019 1124   BUN 18 12/13/2019 1124   CREATININE 0.98 12/13/2019 1124      Component Value Date/Time   CALCIUM 9.5 12/13/2019 1124   ALKPHOS 84 12/13/2019 1124   AST 14 (L) 12/13/2019 1124   ALT <6 12/13/2019 1124   BILITOT 1.0 12/13/2019 1124       Lab Results  Component Value Date   WBC 6.4 12/13/2019   HGB 16.0 12/13/2019   HCT 46.9 12/13/2019   HCT 48.6 12/13/2019   MCV 93.5 12/13/2019   PLT 136 (L) 12/13/2019    Lab Results  Component Value Date   TSH 2.385 04/20/2019     Total time spent on today's visit was 25 minutes, including both face-to-face time and nonface-to-face time.  Time included that spent on review of records (prior notes available to me/labs/imaging if pertinent), discussing treatment and goals, answering patient's questions and coordinating care.  Cc:  Shon Baton, MD

## 2020-05-21 NOTE — Patient Instructions (Signed)
1.  Exercise! 2.  Let me know if you want an order for physical therapy  The physicians and staff at [facility] are committed to providing excellent care. You may receive a survey requesting feedback about your experience at our office. We strive to receive "very good" responses to the survey questions. If you feel that your experience would prevent you from giving the office a "very good " response, please contact our office to try to remedy the situation. We may be reached at [phone]. Thank you for taking the time out of your busy day to complete the survey.

## 2020-08-30 ENCOUNTER — Ambulatory Visit: Payer: Medicare Other | Admitting: Neurology

## 2020-10-21 ENCOUNTER — Telehealth: Payer: Self-pay | Admitting: Neurology

## 2020-10-21 NOTE — Telephone Encounter (Signed)
Can you change thanks

## 2020-10-21 NOTE — Telephone Encounter (Signed)
Patient called and wants to know if he can do his next visit with Dr. Carles Collet virtually on 12/17/2020 or whether it will need to be in the office. Patient explained, "I'm doing well. Please let Dr. Carles Collet know I haven't had any changes. I'm just worried about limiting my exposure to the Covid virus."

## 2020-10-21 NOTE — Telephone Encounter (Signed)
Please see and advise

## 2020-10-21 NOTE — Telephone Encounter (Signed)
He can do next one VV if he prefers since he came in for last one

## 2020-11-15 ENCOUNTER — Other Ambulatory Visit: Payer: Self-pay | Admitting: Neurology

## 2020-11-22 ENCOUNTER — Ambulatory Visit: Payer: Medicare Other | Admitting: Neurology

## 2020-12-13 ENCOUNTER — Other Ambulatory Visit: Payer: Self-pay | Admitting: Neurology

## 2020-12-17 ENCOUNTER — Telehealth: Payer: Medicare Other | Admitting: Neurology

## 2021-01-11 ENCOUNTER — Other Ambulatory Visit: Payer: Self-pay | Admitting: Neurology

## 2021-02-07 ENCOUNTER — Other Ambulatory Visit: Payer: Self-pay | Admitting: Neurology

## 2021-02-07 NOTE — Telephone Encounter (Signed)
Rx(s) sent to pharmacy electronically.  

## 2021-04-25 ENCOUNTER — Ambulatory Visit: Payer: Medicare Other | Admitting: Neurology
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
## 2021-04-30 ENCOUNTER — Encounter: Payer: Self-pay | Admitting: Neurology

## 2021-04-30 ENCOUNTER — Ambulatory Visit: Payer: Medicare Other | Admitting: Neurology

## 2021-04-30 ENCOUNTER — Other Ambulatory Visit: Payer: Self-pay

## 2021-04-30 VITALS — BP 108/64 | HR 86 | Ht 70.0 in | Wt 157.0 lb

## 2021-04-30 DIAGNOSIS — G20 Parkinson's disease: Secondary | ICD-10-CM | POA: Diagnosis not present

## 2021-04-30 MED ORDER — CARBIDOPA-LEVODOPA ER 25-100 MG PO TBCR: EXTENDED_RELEASE_TABLET | ORAL | 1 refills | Status: DC

## 2021-04-30 MED ORDER — AMBULATORY NON FORMULARY MEDICATION: 0 refills | Status: AC

## 2021-04-30 NOTE — Patient Instructions (Signed)
Take carbidopa/levodopa 25/100 CR, 2 at 8am, 2 at noon, 1 at 5pm.    As a reminder, carbidopa/levodopa can be taken at the same time as a carbohydrate, but we like to have you take your pill either 30 minutes before a protein source or 1 hour after as protein can interfere with carbidopa/levodopa absorption.

## 2021-05-07 ENCOUNTER — Other Ambulatory Visit: Payer: Self-pay | Admitting: Neurology

## 2021-05-12 ENCOUNTER — Telehealth: Payer: Self-pay | Admitting: Neurology

## 2021-05-12 NOTE — Telephone Encounter (Signed)
It definitely wouldn't case SOB and needs to f/u with PCP if he is SOB.  I personally wouldn't decrease it because I increased it because he was weak!  Instead of carbidopa/levodopa 25/100, 2 at 7am, 2 at noon, 1 at 4 pm, he could try 2 at 7am, 1 at 11am, 1 at 3pm, 1 at 7pm and see if he does better but he would have to remember it 4 times per day this way

## 2021-05-12 NOTE — Telephone Encounter (Signed)
Pt called in, would like to know if he could decrease his carbidopa levo. Since it was increased, he has been SOB and weak. [phone]

## 2021-05-13 NOTE — Telephone Encounter (Signed)
Pt called in inquiring about his call yesterday. [phone]

## 2021-05-13 NOTE — Telephone Encounter (Signed)
Patients daughter called and I Informed her Per Dr. Carles Collet recommendations.  "It definitely wouldn't case SOB and needs to f/u with PCP if he is SOB.  I personally wouldn't decrease it because I increased it because he was weak!  Instead of carbidopa/levodopa 25/100, 2 at 7am, 2 at noon, 1 at 4 pm, he could try 2 at 7am, 1 at 11am, 1 at 3pm, 1 at 7pm and see if he does better but he would have to remember it 4 times per day this way"  Patients daughter stated that she will go over this with her dad and try the adjustments. If it doesn't help any they will call our office back.

## 2021-07-28 ENCOUNTER — Other Ambulatory Visit: Payer: Self-pay | Admitting: Neurology

## 2021-07-28 DIAGNOSIS — G20 Parkinson's disease: Secondary | ICD-10-CM

## 2021-07-29 ENCOUNTER — Other Ambulatory Visit: Payer: Self-pay

## 2021-10-01 ENCOUNTER — Ambulatory Visit: Payer: Medicare Other | Admitting: Neurology

## 2021-10-14 NOTE — Progress Notes (Signed)
Assessment/Plan:   1.  Parkinsons Disease  -will continue carbidopa/levodopa 25/100, 2/1/1.  He didn't quite tolerate 2 po tid.  He looks underdosed today but he is also past due for medication.  He is happy with his current degree of control  -discussed PT but he has not wanted to do that.      2.  RBD  -No medication required right now.  Safety discussed.     Subjective:   Jeremiah Zimmerman was seen today in follow up for Parkinsons disease.  My previous records were reviewed prior to todays visit as well as outside records available to me.  Patient accompanied by his daughter who supplements the history.  I increased his levodopa last visit.  They called me a few weeks later to ask if he could decrease it.  They thought that he had been more short of breath and weak.  Discussed with him that it would not cause shortness of breath, and we increased the medicine because of weakness.  Gave them an alternative dosing schedule so that he could spread the dosages out, but also told them that this would involve 4 times per day dosing.  He didn't do that and went back to carbidopa/levodopa 25/100, 2/1/1.    Current prescribed movement disorder medications: Carbidopa/levodopa 25/100 CR, 2 at 7am, 1 at 11am, 1 at 3pm, 1 at 7pm (taking carbidopa/levodopa 25/100 2/1/1)   ALLERGIES:   Allergies  Allergen Reactions   Erythromycin Nausea Only    REACTION: Intolerance to this medication with upset stomach   Nitroglycerin Other (See Comments)    REACTION: Intolerance to this medication.  It drops his BP too low and quickly.   Sulfa Antibiotics Nausea Only and Rash   Tetracyclines & Related Nausea Only   Amoxicillin Diarrhea and Nausea Only   Penicillins Swelling and Rash    Has patient had a PCN reaction causing immediate rash, facial/tongue/throat swelling, SOB or lightheadedness with hypotension: Yes Has patient had a PCN reaction causing severe rash involving mucus membranes or skin  necrosis: No Has patient had a PCN reaction that required hospitalization: No Has patient had a PCN reaction occurring within the last 10 years: No If all of the above answers are "NO", then may proceed with Cephalosporin use.   REACTION: Patient develops a rash and swelling at inject    CURRENT MEDICATIONS:  Outpatient Encounter Medications as of 10/16/2021  Medication Sig   AMBULATORY NON FORMULARY MEDICATION Lift Chair Dx:  G20   Carbidopa-Levodopa ER (SINEMET CR) 25-100 MG tablet controlled release TAKE 1 TABLET BY MOUTH THREE TIMES A DAY (Patient taking differently: 2 in the morning, 1 at noon and 1 at dinner)   docusate sodium (COLACE) 100 MG capsule Take 100 mg by mouth 3 (three) times daily.   Multiple Vitamin (MULTIVITAMIN) capsule Take 1 capsule by mouth daily.   polyethylene glycol (MIRALAX) packet Take 17 g by mouth daily as needed for mild constipation.   Tamsulosin HCl (FLOMAX) 0.4 MG CAPS Take 0.4 mg by mouth daily.    AMBULATORY NON FORMULARY MEDICATION Rollator Dx: G20 (Patient not taking: Reported on 10/16/2021)   No facility-administered encounter medications on file as of 10/16/2021.    Objective:   PHYSICAL EXAMINATION:    VITALS:   Vitals:   10/16/21 1308  BP: 130/60  Pulse: 79  SpO2: 99%  Weight: 153 lb 12.8 oz (69.8 kg)  Height: 5\' 10"  (1.778 m)      GEN:  The patient appears stated age and is in NAD. HEENT:  Normocephalic, atraumatic.  The mucous membranes are moist. The superficial temporal arteries are without ropiness or tenderness. CV:  RRR Lungs:  CTAB Neck/HEME:  There are no carotid bruits bilaterally.  Neurological examination:  Orientation: The patient is alert and oriented x3. Cranial nerves: There is good facial symmetry with min facial hypomimia. The speech is fluent and clear. Soft palate rises symmetrically and there is no tongue deviation. Hearing is intact to conversational tone. Sensation: Sensation is intact to light touch  throughout Motor: Strength is at least antigravity x4.  Movement examination: Tone: There is mild increased tone on the R Abnormal movements: there is intermittent RUE rest tremor Coordination:  There is decremation, with any form of RAMS, including alternating supination and pronation of the forearm, hand opening and closing, finger taps, heel taps and toe taps, R>L Gait and Station: The patient has significant difficulty arising out of a deep-seated chair without the use of the hands. The patient's stride length is decreased and he is off balanced even with the cane.  He does better the more he walks.  I have reviewed and interpreted the following labs independently    Chemistry      Component Value Date/Time   NA 142 12/13/2019 1124   K 4.3 12/13/2019 1124   CL 105 12/13/2019 1124   CO2 29 12/13/2019 1124   BUN 18 12/13/2019 1124   CREATININE 0.98 12/13/2019 1124      Component Value Date/Time   CALCIUM 9.5 12/13/2019 1124   ALKPHOS 84 12/13/2019 1124   AST 14 (L) 12/13/2019 1124   ALT <6 12/13/2019 1124   BILITOT 1.0 12/13/2019 1124       Lab Results  Component Value Date   WBC 6.4 12/13/2019   HGB 16.0 12/13/2019   HCT 46.9 12/13/2019   HCT 48.6 12/13/2019   MCV 93.5 12/13/2019   PLT 136 (L) 12/13/2019    Lab Results  Component Value Date   TSH 2.385 04/20/2019      Cc:  Shon Baton, MD

## 2021-10-16 ENCOUNTER — Other Ambulatory Visit: Payer: Self-pay

## 2021-10-16 ENCOUNTER — Ambulatory Visit: Payer: Medicare Other | Admitting: Neurology

## 2021-10-16 ENCOUNTER — Encounter: Payer: Self-pay | Admitting: Neurology

## 2021-10-16 DIAGNOSIS — G20 Parkinson's disease: Secondary | ICD-10-CM | POA: Diagnosis not present

## 2021-10-16 MED ORDER — CARBIDOPA-LEVODOPA ER 25-100 MG PO TBCR: EXTENDED_RELEASE_TABLET | ORAL | 1 refills | Status: DC

## 2021-10-16 NOTE — Patient Instructions (Signed)
Let us know if you would like home PT  Online Resources for Power over Parkinson's Group November 2022  [place] over Parkinson's Group :   Power Over Parkinson's Patient Education Group will be Wednesday, November 9th-*Hybrid meting*- in person at [place] location and via [facility] at 2:00 pm.   Upcoming Power over Parkinson's Meetings:  2nd Wednesdays of the month at 2 pm:  November 9th, December 14th [place] at [email or web address] if interested in participating in this online group Parkinson's Care Partners Group:    3rd Mondays, Contact Misty Paladino Atypical Parkinsonian Patient Group:   4th Wednesdays, [place] If you are interested in participating in these online groups with Misty, please contact her directly for how to join those meetings.  Her contact information is [email or web address].   [place]:  [email or web address] PD Health at Home continues:  Mindfulness Mondays, Expert Briefing Tuesdays, Wellness Wednesdays, Take Time Thursdays, Fitness Fridays -Listings for June 2022 are on the website Upcoming Webinar:  Expert Briefing:  Let's Talk about Dementia.  Wednesday, November 2nd  at 1 pm. Register for Armed forces operational officer) at [email or web address]  Please check out their website to sign up for emails and see their full online offerings  [place]:  [email or web address]  Upcoming Webinar:   2022 in Review:  Progress Toward Better Treatment and Prevention.  Thursday, November 17th at 12 noon Check out additional information on their website to see their full online offerings  [place]:  [email or web address] Upcoming Webinar:  NOH (Neurogenic Orthostatic Hypotension):  What it is, How to Know if you Have it, and How to Manage it.  Tuesday, November 15th at 3 pm.  Webinar  Series:  Living with Parkinson's Meetup.   Third Thursdays of each month at 3 pm; next one is November 17th Care Partner Monthly Meetup.  With Robin Searing Phinney.  First Tuesday of each month, 2 pm Joy Breaks:  First Wednesday of each month, 2-3 pm. There will be art, doodling, making, crafting, listening, laughing, stories, and everything in between. No art experience necessary. No supplies required. Just show up for joy!  Register on their website. Check out additional information to Live Well Today on their website  Parkinson and Movement Disorders (PMD) Alliance:  www.[email or web address] [employer]Life Online:  Online Education Events Sign up for emails, which are sent weekly to give you updates on programming and online offerings  Parkinson's Association of [place]:  www.[email or web address] Information on online support groups, education events, and online exercises including Yoga, Parkinson's exercises and more-LOTS of information on links to PD resources and online events Virtual Support Group through Parkinson's Association of the [place]; next one is scheduled for Wednesday, November 2nd  at 2 pm.  (These are typically scheduled for the 1st Wednesday of the month at 2 pm).  Visit website for details.  Additional links for movement activities: Parkinson's DRUMMING Classes/Music Therapy with Doylene Canning:  This is a returning class and it's FREE!  2nd Mondays, continuing November 14th.  Contact *Misty Taylor-Paladino at [email or web address] or Doylene Canning at [phone] or [email or web address]  PWR! Moves Classes at [place].  Wednesdays 10 and 11 am.  Contact Amy Marriott, PT [email or web address] if interested. Here is a link to the PWR!Moves classes on Zoom from [place] - Daily Mon-Sat at 10:00. Via Zoom, FREE and open to all.  There is also a link below via Facebook if you use that  platform. [email or web address]  [email or web address] Parkinson's Wellness Recovery (PWR! Moves)  www.[email or web address] Info on the PWR! Virtual Experience:  You will have access to our expertise through self-assessment, guided plans that start with the PD-specific fundamentals, educational content, tips, Q&A with an expert, and a growing Art therapist of PD-specific pre-recorded and live exercise classes of varying types and intensity - both physical and cognitive! If that is not enough, we offer 1:1 wellness consultations (in-person or virtual) to personalize your PWR! Research scientist (medical).  [place] Fridays:  As part of the PD Health @ Home program, this free video series focuses each week on one aspect of fitness designed to support people living with Parkinson's.  These weekly videos highlight the [place] recent fitness guidelines for people with Parkinson's disease.  HollywoodSale.dk Dance for PD website is offering free, live-stream classes throughout the week, as well as links to [employer] of classes:  [email or web address] Dance for Parkinson's Class:  [place].  Free offering for people with Parkinson's and care partners; virtual class.  For more information, contact [phone] or email Ruffin Frederick at [email or web address] Virtual dance and Pilates for Parkinson's classes: Click on the Community Tab> Parkinson's Movement Initiative Tab.  To register for classes and for more information, visit [email or web address] and click the "community" tab.  [employer] Parkinson's Cycling Classes  [employer]: 1pm on Fridays-Live classes at [employer] ([employer] at  [email or web address] or [phone]) [employer]: Virtual Classes Mondays and Thursdays Jeanette Caprice classes Tuesday, Wednesday and Thursday (contact [place] at [email or web address]  or [phone]) [place] Varied levels of classes are offered Mondays, Tuesdays and Thursdays at [employer].  To observe a class or for more information, call [phone] or email [email or web address] [employer]: Online Caregiver Education Opportunities:  www.[email or web address]/caregiver-[email or web address]/[email or web address]-support-group.  You may also contact Vickki Muff at [email or web address] or [phone].   *Multiple opportunities below, as November is Caregiver Month!* A Guide to Physical and Mental Fitness for Family Caregivers.  Wednesday, November 9th, 12:30-2 pm at [facility] of You!  Tuesday, November 15th, 11:30-12:45.  [facility], [place] 05-22-1992 Understanding Care Options and Advanced Care Planning.  Monday, November 21st, 4:30-5:45.  [place], [place] above to register. Well-Spring Navigator:  [address] program, a free service to help individuals and families through the journey of determining care for older adults.  The "Navigator" is a [employer], Education officer, museum, who will speak with a prospective client and/or loved ones to provide an assessment of the situation and a set of recommendations for a personalized care plan -- all free of charge, and whether [facility] [employer] offers the needed service or not. If the need is not a service we provide, we are well-connected with reputable programs in town that we can refer you to.  www.well-[email or web address] or to speak with the Navigator, call [phone].

## 2022-04-20 NOTE — Progress Notes (Signed)
Assessment/Plan:   1.  Parkinsons Disease  -will continue carbidopa/levodopa 25/100, 2/1/1.  He didn't quite tolerate 2 po tid. he also did not tolerate a 4 times per day dosing regimen.  ***  -discussed PT but he has not wanted to do that.      2.  RBD  -No medication required right now.  Safety discussed.     Subjective:   Jeremiah Zimmerman was seen today in follow up for Parkinsons disease.  My previous records were reviewed prior to todays visit as well as outside records available to me.  Patient accompanied by his daughter who supplements the history.  No falls since last visit.  No lightheadedness or near syncope.  Current prescribed movement disorder medications: Carbidopa/levodopa 25/100 CR, 2/1/1   ALLERGIES:   Allergies  Allergen Reactions   Erythromycin Nausea Only    REACTION: Intolerance to this medication with upset stomach   Nitroglycerin Other (See Comments)    REACTION: Intolerance to this medication.  It drops his BP too low and quickly.   Sulfa Antibiotics Nausea Only and Rash   Tetracyclines & Related Nausea Only   Amoxicillin Diarrhea and Nausea Only   Penicillins Swelling and Rash    Has patient had a PCN reaction causing immediate rash, facial/tongue/throat swelling, SOB or lightheadedness with hypotension: Yes Has patient had a PCN reaction causing severe rash involving mucus membranes or skin necrosis: No Has patient had a PCN reaction that required hospitalization: No Has patient had a PCN reaction occurring within the last 10 years: No If all of the above answers are "NO", then may proceed with Cephalosporin use.   REACTION: Patient develops a rash and swelling at inject    CURRENT MEDICATIONS:  Outpatient Encounter Medications as of 04/21/2022  Medication Sig   AMBULATORY NON FORMULARY MEDICATION Lift Chair Dx:  G20   AMBULATORY NON FORMULARY MEDICATION Rollator Dx: G20 (Patient not taking: Reported on 10/16/2021)   Carbidopa-Levodopa ER  (SINEMET CR) 25-100 MG tablet controlled release 2 in the morning, 1 at noon and 1 at dinner   docusate sodium (COLACE) 100 MG capsule Take 100 mg by mouth 3 (three) times daily.   Multiple Vitamin (MULTIVITAMIN) capsule Take 1 capsule by mouth daily.   polyethylene glycol (MIRALAX) packet Take 17 g by mouth daily as needed for mild constipation.   Tamsulosin HCl (FLOMAX) 0.4 MG CAPS Take 0.4 mg by mouth daily.    No facility-administered encounter medications on file as of 04/21/2022.    Objective:   PHYSICAL EXAMINATION:    VITALS:   There were no vitals filed for this visit.     GEN:  The patient appears stated age and is in NAD. HEENT:  Normocephalic, atraumatic.  The mucous membranes are moist. The superficial temporal arteries are without ropiness or tenderness. CV:  RRR Lungs:  CTAB Neck/HEME:  There are no carotid bruits bilaterally.  Neurological examination:  Orientation: The patient is alert and oriented x3. Cranial nerves: There is good facial symmetry with min facial hypomimia. The speech is fluent and clear. Soft palate rises symmetrically and there is no tongue deviation. Hearing is intact to conversational tone. Sensation: Sensation is intact to light touch throughout Motor: Strength is at least antigravity x4.  Movement examination: Tone: There is mild increased tone on the R Abnormal movements: there is intermittent RUE rest tremor Coordination:  There is decremation, with any form of RAMS, including alternating supination and pronation of the forearm, hand opening  and closing, finger taps, heel taps and toe taps, R>L Gait and Station: The patient has significant difficulty arising out of a deep-seated chair without the use of the hands. The patient's stride length is decreased and he is off balanced even with the cane.  He does better the more he walks.  I have reviewed and interpreted the following labs independently    Chemistry      Component Value  Date/Time   NA 142 12/13/2019 1124   K 4.3 12/13/2019 1124   CL 105 12/13/2019 1124   CO2 29 12/13/2019 1124   BUN 18 12/13/2019 1124   CREATININE 0.98 12/13/2019 1124      Component Value Date/Time   CALCIUM 9.5 12/13/2019 1124   ALKPHOS 84 12/13/2019 1124   AST 14 (L) 12/13/2019 1124   ALT <6 12/13/2019 1124   BILITOT 1.0 12/13/2019 1124       Lab Results  Component Value Date   WBC 6.4 12/13/2019   HGB 16.0 12/13/2019   HCT 46.9 12/13/2019   HCT 48.6 12/13/2019   MCV 93.5 12/13/2019   PLT 136 (L) 12/13/2019    Lab Results  Component Value Date   TSH 2.385 04/20/2019   Total time spent on today's visit was *** minutes, including both face-to-face time and nonface-to-face time.  Time included that spent on review of records (prior notes available to me/labs/imaging if pertinent), discussing treatment and goals, answering patient's questions and coordinating care.    Cc:  Shon Baton, MD

## 2022-04-21 ENCOUNTER — Ambulatory Visit: Payer: Medicare Other | Admitting: Neurology

## 2022-04-21 ENCOUNTER — Encounter: Payer: Self-pay | Admitting: Neurology

## 2022-04-21 VITALS — BP 136/60 | HR 73 | Ht 70.0 in | Wt 150.0 lb

## 2022-04-21 DIAGNOSIS — G20 Parkinson's disease: Secondary | ICD-10-CM | POA: Diagnosis not present

## 2022-04-21 NOTE — Patient Instructions (Signed)
Take carbidopa/levodopa 25/100, 2 at 7am, 1 at 11am, 1 at 4pm.  This will still be underdosed.  You will need more than that.  Take your medication with carbohydrate, such as applesauce, toast or cracker  I would love to send you to physical therapy.  Let me know if you change your mind.    Exercise!  Local and Online Resources for Power over Parkinson's Group June 2023  LOCAL [place] PARKINSON'S GROUPS  Power over Parkinson's Group:   Power Over Parkinson's Patient Education Group will be Wednesday, June 14th-*Hybrid meting*- in person at [facility] location and via [facility] at 2:00 pm.   Upcoming Power over [employer] Meetings:  2nd Wednesdays of the month at 2 pm:   June 14th, July 12th Contact Amy Marriott at [email or web address] if interested in participating in this group Parkinson's Care Partners Group:    3rd Mondays, Contact Misty Paladino Atypical Parkinsonian Patient Group:   4th Wednesdays, [place] If you are interested in participating in these groups with Misty, please contact her directly for how to join those meetings.  Her contact information is [email or web address].    LOCAL EVENTS AND NEW OFFERINGS Dance Class for People with Parkinson's at [facility].  Friday, June 9th at 2 pm.  Dora Sims by Tyler Deis DPT students.  Contact [email or web address] to register or with questions. Ice Cream Social at [place]!  Thursday, June 15th, 5:30-7:00 pm.  RSVP to [email or web address] for attendance and free ice cream. Parkinson's T-shirts for sale!  Designed by a local group member, with funds going to [place].  $25.00  Investment banker, corporate to purchase  [employer]! Moves Dynegy Instructor-Led Class offering at [employer]!  Wednesdays 1-2 pm, starting April 12th.   Contact Bryson Dames, Acupuncturist at [employer].  Manuela [email or web address]  [place]:  [email or web address] PD Health  at Home continues:  Mindfulness Mondays, Wellness Wednesdays, Fitness Fridays  Upcoming Education: Parkinson's 101:  What You and Your Family Should Know.  Wednesday, June 7th at 1:00 pm Register for expert briefings (webinars) at [email or web address] Please check out their website to sign up for emails and see their full online offerings   [place]:  [email or web address]  Third Thursday Webinars:  On the third Thursday of every month at 12 p.m. ET, join our free live webinars to learn about various aspects of living with Parkinson's disease and our work to speed medical breakthroughs. Upcoming Webinar: REPLAY:  From Low Blood Pressure to Bladder Problems:  A Look at Lesser Known Parkinson's Symptoms.  Thursday, June 15th at 12 noon. Check out additional information on their website to see their full online offerings  [facility]:  [email or web address] Upcoming Webinar:   Stay tuned Webinar Series:  Living with Parkinson's Meetup.   Third Thursdays each month, 3 pm Care Partner Monthly Meetup.  With Robin Searing [place].  First Tuesday of each month, 2 pm Check out additional information to Live Well Today on their website  Parkinson and Movement Disorders (PMD) Alliance:  www.pmdalliance.org [employer] Online:  Online Education Events Sign up for emails, which are sent weekly to give you updates on programming and online offerings  Parkinson's Association of the [place]:  www.[email or web address] Information on online support groups, education events, and online exercises including Yoga, Parkinson's exercises and more-LOTS of information on links to PD resources and online events Virtual Support Group through Parkinson's Association of the [place]; next one is scheduled for Wednesday, June 7th at 2 pm. (These are typically  scheduled for the 1st Wednesday of the month at 2 pm).   Visit website for details. Save the date for "Caring for Parkinson's-Caring for You", 9th Annual Symposium.  In-person event in [place].  September 9th.  More info on registration to come. MOVEMENT AND EXERCISE OPPORTUNITIES PWR! Moves Classes at [place].  Wednesdays 10 and 11 am.   Contact Amy Marriott, PT [email or web address] if interested. NEW PWR! Moves Class offering at [employer].  Wednesdays 1-2 pm, starting April 12th.  Contact Bryson Dames, Acupuncturist at [employer].  [email or web address] Here is a link to the PWR!Moves classes on Zoom from [place] - Daily Mon-Sat at 10:00. Via Zoom, FREE and open to all.  There is also a link below via Facebook if you use that platform.  [email or web address].si [email or web address]  Parkinson's Wellness Recovery (PWR! Moves)  www.[email or web address] Info on the PWR! Virtual Experience:  You will have access to our expertise through self-assessment, guided plans that start with the PD-specific fundamentals, educational content, tips, Q&A with an expert, and a growing Art therapist of PD-specific pre-recorded and live exercise classes of varying types and intensity - both physical and cognitive! If that is not enough, we offer 1:1 wellness consultations (in-person or virtual) to personalize your PWR! Research scientist (medical).  [place] Fridays:  As part of the PD Health @ Home program, this free video series focuses each week on one aspect of fitness designed to support people living with Parkinson's.  These weekly videos highlight the [place] recent fitness guidelines for people with Parkinson's disease. ModemGamers.si Dance for PD website is offering free,  live-stream classes throughout the week, as well as links to [employer] of classes:  [email or web address]/ Virtual dance and Pilates for Parkinson's classes: Click on the Community Tab> Parkinson's Movement Initiative Tab.  To register for classes and for more information, visit [email or web address] and click the "community" tab.  [employer] Parkinson's Cycling Classes  [employer]:  Thursdays @ Noon-Live classes at [employer] ([employer] at [email or web address] or ([phone]) [employer]: Virtual Classes Mondays and Thursdays Jeanette Caprice classes Tuesday, Wednesday and Thursday (contact [place] at [email or web address]  or [phone]) [place] Varied levels of classes are offered Tuesdays and Thursdays at [facility].  Stretching with Verdis Frederickson weekly class is also offered for people with Parkinson's To observe a class or for more information, call [phone] or email Hezzie Bump at [email or web address] ADDITIONAL SUPPORT AND RESOURCES Well-Spring Solutions:Online Caregiver Education Opportunities:  www.well-springsolutions.org/caregiver-education/caregiver-support-group.  You may also contact Vickki Muff at [email or web address] or [phone].    Well-Spring Navigator:  [phone] program, a free service to help individuals and families through the journey of determining care for older adults.  The "Navigator" is a [employer], Education officer, museum, who will speak with a prospective client and/or loved ones to provide an assessment of the situation and a set of recommendations for a personalized care plan -- all free of charge, and whether Well-Spring [employer] offers the needed service or not. If the need is not a service we provide, we are well-connected with reputable programs in town that we can refer you to.  www.well-springsolutions.org or to speak with the Navigator, call [phone]. Family  Caregiver Programming in June:  [employer], Thursday, June 15th 11-12:30 at 01-04-1970. [place].  Call [phone] to register

## 2022-05-31 ENCOUNTER — Encounter (HOSPITAL_BASED_OUTPATIENT_CLINIC_OR_DEPARTMENT_OTHER): Payer: Self-pay | Admitting: Emergency Medicine

## 2022-05-31 ENCOUNTER — Emergency Department (HOSPITAL_BASED_OUTPATIENT_CLINIC_OR_DEPARTMENT_OTHER)
Admission: EM | Admit: 2022-05-31 | Discharge: 2022-05-31 | Disposition: A | Discharge status: Home / Self Care | Payer: Medicare Other | Attending: Emergency Medicine | Admitting: Emergency Medicine

## 2022-05-31 ENCOUNTER — Emergency Department (HOSPITAL_BASED_OUTPATIENT_CLINIC_OR_DEPARTMENT_OTHER): Payer: Medicare Other

## 2022-05-31 ENCOUNTER — Other Ambulatory Visit: Payer: Self-pay

## 2022-05-31 DIAGNOSIS — I447 Left bundle-branch block, unspecified: Secondary | ICD-10-CM

## 2022-05-31 DIAGNOSIS — G20 Parkinson's disease: Secondary | ICD-10-CM | POA: Diagnosis not present

## 2022-05-31 DIAGNOSIS — D696 Thrombocytopenia, unspecified: Secondary | ICD-10-CM | POA: Diagnosis not present

## 2022-05-31 DIAGNOSIS — S0990XA Unspecified injury of head, initial encounter: Secondary | ICD-10-CM | POA: Diagnosis present

## 2022-05-31 DIAGNOSIS — W19XXXA Unspecified fall, initial encounter: Secondary | ICD-10-CM

## 2022-05-31 DIAGNOSIS — Y92009 Unspecified place in unspecified non-institutional (private) residence as the place of occurrence of the external cause: Secondary | ICD-10-CM | POA: Insufficient documentation

## 2022-05-31 DIAGNOSIS — S0081XA Abrasion of other part of head, initial encounter: Secondary | ICD-10-CM | POA: Insufficient documentation

## 2022-05-31 DIAGNOSIS — W01198A Fall on same level from slipping, tripping and stumbling with subsequent striking against other object, initial encounter: Secondary | ICD-10-CM | POA: Diagnosis not present

## 2022-05-31 DIAGNOSIS — I6201 Nontraumatic acute subdural hemorrhage: Secondary | ICD-10-CM | POA: Diagnosis not present

## 2022-05-31 DIAGNOSIS — I6200 Nontraumatic subdural hemorrhage, unspecified: Secondary | ICD-10-CM

## 2022-05-31 LAB — URINALYSIS, ROUTINE W REFLEX MICROSCOPIC
Bilirubin Urine: NEGATIVE
Glucose, UA: NEGATIVE mg/dL
Hgb urine dipstick: NEGATIVE
Ketones, ur: NEGATIVE mg/dL
Leukocytes,Ua: NEGATIVE
Nitrite: NEGATIVE
Protein, ur: NEGATIVE mg/dL
Specific Gravity, Urine: 1.018 (ref 1.005–1.030)
pH: 7 (ref 5.0–8.0)

## 2022-05-31 LAB — BASIC METABOLIC PANEL
Anion gap: 8 (ref 5–15)
BUN: 15 mg/dL (ref 8–23)
CO2: 29 mmol/L (ref 22–32)
Calcium: 9.3 mg/dL (ref 8.9–10.3)
Chloride: 104 mmol/L (ref 98–111)
Creatinine, Ser: 0.89 mg/dL (ref 0.61–1.24)
GFR, Estimated: >60 mL/min (ref >60)
Glucose, Bld: 117 mg/dL — ABNORMAL HIGH (ref 70–99)
Potassium: 3.9 mmol/L (ref 3.5–5.1)
Sodium: 141 mmol/L (ref 135–145)

## 2022-05-31 LAB — CBC WITH DIFFERENTIAL/PLATELET
Abs Immature Granulocytes: 0.07 10*3/uL (ref 0.00–0.07)
Basophils Absolute: 0 10*3/uL (ref 0.0–0.1)
Basophils Relative: 0 %
Eosinophils Absolute: 0.1 10*3/uL (ref 0.0–0.5)
Eosinophils Relative: 1 %
HCT: 41 % (ref 39.0–52.0)
Hemoglobin: 13.6 g/dL (ref 13.0–17.0)
Immature Granulocytes: 1 %
Lymphocytes Relative: 12 %
Lymphs Abs: 1.1 10*3/uL (ref 0.7–4.0)
MCH: 30.6 pg (ref 26.0–34.0)
MCHC: 33.2 g/dL (ref 30.0–36.0)
MCV: 92.3 fL (ref 80.0–100.0)
Monocytes Absolute: 0.7 10*3/uL (ref 0.1–1.0)
Monocytes Relative: 7 %
Neutro Abs: 7.2 10*3/uL (ref 1.7–7.7)
Neutrophils Relative %: 79 %
Platelets: 138 10*3/uL — ABNORMAL LOW (ref 150–400)
RBC: 4.44 MIL/uL (ref 4.22–5.81)
RDW: 12.1 % (ref 11.5–15.5)
WBC: 9.1 10*3/uL (ref 4.0–10.5)
nRBC: 0 % (ref 0.0–0.2)

## 2022-05-31 MED ORDER — ONDANSETRON HCL 4 MG/2ML IJ SOLN
4.0000 mg | Freq: Once | INTRAMUSCULAR | Status: AC
  Administered 2022-05-31: 4 mg via INTRAVENOUS
  Filled 2022-05-31: qty 2

## 2022-05-31 MED ORDER — FENTANYL CITRATE PF 50 MCG/ML IJ SOSY
20.0000 ug | PREFILLED_SYRINGE | Freq: Once | INTRAMUSCULAR | Status: AC
  Administered 2022-05-31: 20 ug via INTRAVENOUS
  Filled 2022-05-31: qty 1

## 2022-05-31 MED ORDER — ONDANSETRON 4 MG PO TBDP: ORAL_TABLET | ORAL | 0 refills | Status: DC

## 2022-05-31 NOTE — ED Triage Notes (Signed)
Pt fell approx 1 hours ago. Pt has abrasion on forehead and abrasion to back of head. Pt complains of neck pain. Son states pt hit the kitchen cabinet but unsure if he fell and hit head. Pt does not remember falling. Pt remembers walking into kitchen to get water and then remembers waking up sitting in the floor.

## 2022-05-31 NOTE — ED Notes (Signed)
Pt discharged home after verbalizing understanding of discharge instructions; nad noted. 

## 2022-05-31 NOTE — Discharge Instructions (Signed)
Use tylenol every 4 hrs as needed for pain. Ice packs for swelling as tolerated.  Return for confusion, vomiting, or new concerns. Use zofran for vomiting or nausea.

## 2022-05-31 NOTE — ED Triage Notes (Signed)
Pt is alert and oriented at this time x 4.

## 2022-05-31 NOTE — ED Provider Notes (Signed)
[facility] EMERGENCY DEPT Provider Note   CSN: [id number] Arrival date & time: 05/31/22  1808     History  Chief Complaint  Patient presents with   Jeremiah Zimmerman is a 88 y.o. male.  Patient presents with nausea, head injury and fall.  Patient has Parkinson's on home medications and lives with significant other and family was called to help him up as he was sitting leaning against the cupboards prior to arrival.  Patient hit his head and has mild neck discomfort.  Patient unsure if fell because of Parkinson's.  Does not think he syncopized.  No chest pain or shortness of breath.  No fevers chills or urinary symptoms.  No blood thinner use.  Family is nearby his residence.       Home Medications Prior to Admission medications   Medication Sig Start Date End Date Taking? Authorizing Provider  ondansetron (ZOFRAN-ODT) 4 MG disintegrating tablet '4mg'$  ODT q4 hours prn nausea/vomit 05/31/22  Yes Elnora Morrison, MD  AMBULATORY NON FORMULARY MEDICATION Lift Chair Dx:  G20 04/30/21   TatEustace Quail, DO  AMBULATORY NON FORMULARY MEDICATION Rollator Dx: G20 Patient not taking: Reported on 10/16/2021 04/30/21   Tat, Eustace Quail, DO  Carbidopa-Levodopa ER (SINEMET CR) 25-100 MG tablet controlled release 2 in the morning, 1 at noon and 1 at dinner Patient taking differently: 2 in the morning, 1 at noon and 1 at dinner 10/16/21   Tat, Wells Guiles S, DO  docusate sodium (COLACE) 100 MG capsule Take 100 mg by mouth 3 (three) times daily.    [provider]  Multiple Vitamin (MULTIVITAMIN) capsule Take 1 capsule by mouth daily.    [provider]  polyethylene glycol (MIRALAX) packet Take 17 g by mouth daily as needed for mild constipation. Patient taking differently: Take 17 g by mouth daily. 07/21/18   Norm Parcel, PA-C  Tamsulosin HCl (FLOMAX) 0.4 MG CAPS Take 0.4 mg by mouth daily.     [provider]      Allergies    Erythromycin, Nitroglycerin,  Sulfa antibiotics, Tetracyclines & related, Amoxicillin, and Penicillins    Review of Systems   Review of Systems  Constitutional:  Negative for chills and fever.  HENT:  Negative for congestion.   Eyes:  Negative for visual disturbance.  Respiratory:  Negative for shortness of breath.   Cardiovascular:  Negative for chest pain.  Gastrointestinal:  Positive for nausea. Negative for abdominal pain and vomiting.  Genitourinary:  Negative for dysuria and flank pain.  Musculoskeletal:  Negative for back pain, neck pain and neck stiffness.  Skin:  Negative for rash.  Neurological:  Positive for weakness (general) and headaches. Negative for seizures, speech difficulty and light-headedness.    Physical Exam Updated Vital Signs BP (!) 154/71   Pulse 72   Temp 97.6 F (36.4 C) (Oral)   Resp 18   Ht '5\' 10"'$  (1.778 m)   Wt 68 kg   SpO2 97%   BMI 21.52 kg/m  Physical Exam Vitals and nursing note reviewed.  Constitutional:      General: He is not in acute distress.    Appearance: He is well-developed.  HENT:     Head: Normocephalic and atraumatic.     Comments: Patient is superficial abrasion/contusion anterior mid scalp and vertex region without step-off, mild hematoma.    Mouth/Throat:     Mouth: Mucous membranes are moist.  Eyes:     General:  Right eye: No discharge.        Left eye: No discharge.     Conjunctiva/sclera: Conjunctivae normal.  Neck:     Trachea: No tracheal deviation.  Cardiovascular:     Rate and Rhythm: Normal rate and regular rhythm.     Heart sounds: No murmur heard. Pulmonary:     Effort: Pulmonary effort is normal.     Breath sounds: Normal breath sounds.  Abdominal:     General: There is no distension.     Palpations: Abdomen is soft.     Tenderness: There is no abdominal tenderness. There is no guarding.  Musculoskeletal:     Cervical back: Normal range of motion and neck supple. No rigidity.     Comments: Patient has no midline cervical  tenderness, mild paraspinal.  Skin:    General: Skin is warm.     Capillary Refill: Capillary refill takes less than 2 seconds.     Findings: No rash.  Neurological:     General: No focal deficit present.     Mental Status: He is alert.     Comments: Patient has mild tremor right arm, history of Parkinson's this is similar.  Patient has mild general weakness on exam but moves all extremities equal bilateral finger-nose intact.  Sensation intact bilateral.  Pupils equal horizontal eye movements intact.  Visual fields intact.  Psychiatric:        Mood and Affect: Mood normal.     ED Results / Procedures / Treatments   Labs (all labs ordered are listed, but only abnormal results are displayed) Labs Reviewed  BASIC METABOLIC PANEL - Abnormal; Notable for the following components:      Result Value   Glucose, Bld 117 (*)    All other components within normal limits  CBC WITH DIFFERENTIAL/PLATELET - Abnormal; Notable for the following components:   Platelets 138 (*)    All other components within normal limits  URINALYSIS, ROUTINE W REFLEX MICROSCOPIC    EKG EKG Interpretation  Date/Time:  Sunday May 31 2022 18:24:25 EDT Ventricular Rate:  77 PR Interval:    QRS Duration: 156 QT Interval:  420 QTC Calculation: 476 R Axis:   23 Text Interpretation: Left bundle branch block Confirmed by Elnora Morrison [phone]) on 05/31/2022 6:59:59 PM  Radiology CT Head Wo Contrast  Result Date: 05/31/2022 CLINICAL DATA:  Head trauma. EXAM: CT HEAD WITHOUT CONTRAST TECHNIQUE: Contiguous axial images were obtained from the base of the skull through the vertex without intravenous contrast. RADIATION DOSE REDUCTION: This exam was performed according to the departmental dose-optimization program which includes automated exposure control, adjustment of the mA and/or kV according to patient size and/or use of iterative reconstruction technique. COMPARISON:  Head CT dated 05/26/2010. FINDINGS: Brain: Mild  age-related atrophy and chronic microvascular ischemic changes. There is a small subdural hemorrhage over the left frontal convexity measuring approximately 5 mm in thickness. No mass effect or midline shift. Vascular: No hyperdense vessel or unexpected calcification. Skull: Normal. Negative for fracture or focal lesion. Sinuses/Orbits: No acute finding. Other: None IMPRESSION: 1. Small left frontal subdural hemorrhage. No mass effect or midline shift. 2. Mild age-related atrophy and chronic microvascular ischemic changes. These results were called by telephone at the time of interpretation on 05/31/2022 at 8:15 pm to provider [facility] , who verbally acknowledged these results. Electronically Signed   By: Anner Crete M.D.   On: 05/31/2022 20:23   CT Cervical Spine Wo Contrast  Result Date: 05/31/2022 CLINICAL  DATA:  Trauma, fall EXAM: CT CERVICAL SPINE WITHOUT CONTRAST TECHNIQUE: Multidetector CT imaging of the cervical spine was performed without intravenous contrast. Multiplanar CT image reconstructions were also generated. RADIATION DOSE REDUCTION: This exam was performed according to the departmental dose-optimization program which includes automated exposure control, adjustment of the mA and/or kV according to patient size and/or use of iterative reconstruction technique. COMPARISON:  None Available. FINDINGS: Alignment: Unremarkable. Skull base and vertebrae: No recent fracture is seen. Degenerative changes are noted with small bony spurs at multiple levels. Soft tissues and spinal canal: There is no central spinal stenosis. Disc levels: There is encroachment of neural foramina from C4-C7 levels. Upper chest: Unremarkable. Other: There is inhomogeneous attenuation and thyroid. Scattered arterial calcifications are seen. IMPRESSION: No recent fracture is seen in cervical spine. Cervical spondylosis with encroachment of neural foramina from C4-C7 levels. Electronically Signed   By: Elmer Picker M.D.   On: 05/31/2022 20:13    Procedures Procedures    Medications Ordered in ED Medications  fentaNYL (SUBLIMAZE) injection 20 mcg (20 mcg Intravenous Given 05/31/22 2103)  ondansetron (ZOFRAN) injection 4 mg (4 mg Intravenous Given 05/31/22 2101)    ED Course/ Medical Decision Making/ A&P                           Medical Decision Making Amount and/or Complexity of Data Reviewed Labs: ordered. Radiology: ordered. ECG/medicine tests: ordered.  Risk Prescription drug management.   Patient presents after fall possibly mechanical with Parkinson's history and no witnessed seizures or syncope.  Patient overall at baseline with Parkinson's family there to help provide details confirmation.  General blood work ordered and reassuring normal white count, normal hemoglobin, electrolytes unremarkable, kidney function within normal limits.  Urinalysis reviewed no signs of infection explain mild general weakness.  Platelets mild thrombocytopenia, patient's been lower in the past.  Patient observed, Tylenol/fentanyl discussed as needed.  CT scan discussed with radiology of the head and neck no acute fracture, small subdural.  Discussed with neurosurgeon Dr. Ellene Route and subdural very tiny patient is on blood thinners and neurologically at baseline.  He recommends follow-up with primary doctor supportive care.  Updated family and they have enough support to help him the next week.  Patient stable for discharge at this time.  Discussed follow-up with primary doctor and cardiology for left bundle branch block and to help look into other possible reasons for the fall.        Final Clinical Impression(s) / ED Diagnoses Final diagnoses:  Subdural hemorrhage ([place])  Fall, initial encounter  Acute head injury, initial encounter  Left bundle branch block    Rx / DC Orders ED Discharge Orders          Ordered    Ambulatory referral to Cardiology       Comments: If you have not  heard from the Cardiology office within the next 72 hours please call [phone].   05/31/22 2143    ondansetron (ZOFRAN-ODT) 4 MG disintegrating tablet        05/31/22 2226              Elnora Morrison, MD 05/31/22 2232

## 2022-06-01 ENCOUNTER — Telehealth: Payer: Self-pay | Admitting: Neurology

## 2022-06-01 DIAGNOSIS — G20 Parkinson's disease: Secondary | ICD-10-CM

## 2022-06-01 NOTE — Telephone Encounter (Signed)
Called patients daughter Jocelyn Lamer back and left a message for a call back.

## 2022-06-01 NOTE — Telephone Encounter (Signed)
Patients daughter called stating she would like to get a referral for home health physical therapy.  He had a fall and went to ED yesterday.  Please call her for more information.

## 2022-06-02 NOTE — Telephone Encounter (Signed)
Sent referral to [facility]. Pending response if she can take patient.

## 2022-06-02 NOTE — Telephone Encounter (Signed)
Heard back from [place] and she is going to attempt to send in the referral, but not sure if they can accept another [facility] Medicare at the moment.

## 2022-06-02 NOTE — Telephone Encounter (Signed)
Received notification from [facility] that they have accepted patient.

## 2022-06-03 ENCOUNTER — Telehealth: Payer: Self-pay | Admitting: Neurology

## 2022-06-03 NOTE — Telephone Encounter (Signed)
Kenney Houseman from [facility] called and left a message requesting an order to delay the start of care for a patient.

## 2022-06-03 NOTE — Telephone Encounter (Signed)
Jeremiah Zimmerman called back no answer when she calls back we need to see how long the delay of care will be for?

## 2022-06-04 NOTE — Telephone Encounter (Signed)
Pt home health care is going to start 06/05/22 because he had appointments

## 2022-06-11 ENCOUNTER — Telehealth: Payer: Self-pay | Admitting: Neurology

## 2022-06-11 NOTE — Telephone Encounter (Signed)
The following message was left with [facility] on 06/11/22 at 12:51 PM.  Caller states she has a referral for home therapy. Home care will begin tomorrow, 06/12/22.

## 2022-06-12 ENCOUNTER — Telehealth: Payer: Self-pay | Admitting: Neurology

## 2022-06-12 NOTE — Telephone Encounter (Signed)
[facility] [place] called in needing verbal orders. Frequency requested 1 week 1, 2 times for 3 weeks, and once for 3 weeks. They need to work on gate, balance, and transferring

## 2022-06-15 NOTE — Telephone Encounter (Signed)
[facility] called with the verbal orders. Frequency requested 1 week 1, 2 times for 3 weeks, and once for 3 weeks. They need to work on gate, balance, and transferring

## 2022-06-15 NOTE — Telephone Encounter (Signed)
Ok to send, thanks

## 2022-06-18 ENCOUNTER — Encounter: Payer: Self-pay | Admitting: *Deleted

## 2022-07-07 ENCOUNTER — Telehealth: Payer: Self-pay

## 2022-07-07 NOTE — Telephone Encounter (Signed)
[facility] calling for verbal orders for  Home health OT and Eval and treat for shoulder and wrist pain Return number [phone]

## 2022-07-07 NOTE — Telephone Encounter (Signed)
Returned call and left voicemail for [facility] to reach out to the PCP for these orders

## 2022-07-23 ENCOUNTER — Ambulatory Visit: Payer: Medicare Other | Admitting: Neurology

## 2022-07-31 NOTE — Progress Notes (Unsigned)
Assessment/Plan:   1.  Parkinsons Disease  -pt is markedly underdosed.  He has been consistently backing down his dose of levodopa, and we had discussed the consequences of falls, and unfortunately he did suffer a subdural hematoma.   His complaints regarding higher dosages of levodopa are really complaints about disease, rather than medication side effects.  I would really like him to try and increase his dose back to carbidopa/levodopa 25/100, ***  -discussed PT but he has not wanted to do that.  He has declined that the last few visits.  -Discussed importance of exercise at length.  He is not particularly interested.  2.  RBD  -No medication required right now.  Safety discussed.  3.  Left bundle branch block  -When patient was in the emergency room with the subdural hematoma, the emergency room noted left bundle branch block.  They were not sure if it contributed to the fall, but told them to follow-up with cardiology.  I do not see that that has been done.  I encouraged them to do that.   Subjective:   Jeremiah Zimmerman was seen today in follow up for Parkinsons disease.  My previous records were reviewed prior to todays visit as well as outside records available to me.  Patient accompanied by his daughter who supplements the history.  I saw the patient last in June, at which point he was markedly underdosed because patient had been backing down on his dose of levodopa.  His complaints about levodopa were really complaints about disease rather than medication side effects, and I expressed this to him again last visit.  I tried to just slowly increase his dose again last visit, but also told him that he would still be underdosed as it would not be enough levodopa and we would need to continue to increase the dose.  Patient declined physical therapy and exercise at our last visit.  We discussed in detail that I was worried about consequences of this.  Unfortunately, only a month later he was in  the emergency room with a subdural hematoma after a fall.  It was very small (I personally reviewed the images with evidence of small left frontal convexity subdural) and did not require hospitalization.  He saw Dr. Ellene Route.  Emergency room also noted a left bundle branch block and it was recommended that he follow-up with cardiology to make sure no other source for the fall such as a passing out spell.  They did call here after that requesting physical therapy, and those orders were sent.  Current prescribed movement disorder medications: Carbidopa/levodopa 25/100 CR, 2/1/1   ALLERGIES:   Allergies  Allergen Reactions   Erythromycin Nausea Only    REACTION: Intolerance to this medication with upset stomach   Nitroglycerin Other (See Comments)    REACTION: Intolerance to this medication.  It drops his BP too low and quickly.   Sulfa Antibiotics Nausea Only and Rash   Tetracyclines & Related Nausea Only   Amoxicillin Diarrhea and Nausea Only   Penicillins Swelling and Rash    Has patient had a PCN reaction causing immediate rash, facial/tongue/throat swelling, SOB or lightheadedness with hypotension: Yes Has patient had a PCN reaction causing severe rash involving mucus membranes or skin necrosis: No Has patient had a PCN reaction that required hospitalization: No Has patient had a PCN reaction occurring within the last 10 years: No If all of the above answers are "NO", then may proceed with Cephalosporin use.  REACTION: Patient develops a rash and swelling at inject    CURRENT MEDICATIONS:  Outpatient Encounter Medications as of 08/03/2022  Medication Sig   AMBULATORY NON FORMULARY MEDICATION Lift Chair Dx:  G20   AMBULATORY NON FORMULARY MEDICATION Rollator Dx: G20 (Patient not taking: Reported on 10/16/2021)   Carbidopa-Levodopa ER (SINEMET CR) 25-100 MG tablet controlled release 2 in the morning, 1 at noon and 1 at dinner (Patient taking differently: 2 in the morning, 1 at noon and  1 at dinner)   docusate sodium (COLACE) 100 MG capsule Take 100 mg by mouth 3 (three) times daily.   Multiple Vitamin (MULTIVITAMIN) capsule Take 1 capsule by mouth daily.   ondansetron (ZOFRAN-ODT) 4 MG disintegrating tablet '4mg'$  ODT q4 hours prn nausea/vomit   polyethylene glycol (MIRALAX) packet Take 17 g by mouth daily as needed for mild constipation. (Patient taking differently: Take 17 g by mouth daily.)   Tamsulosin HCl (FLOMAX) 0.4 MG CAPS Take 0.4 mg by mouth daily.    No facility-administered encounter medications on file as of 08/03/2022.    Objective:   PHYSICAL EXAMINATION:    VITALS:   There were no vitals filed for this visit.      GEN:  The patient appears stated age and is in NAD. HEENT:  Normocephalic, atraumatic.  The mucous membranes are moist. The superficial temporal arteries are without ropiness or tenderness. CV:  RRR Lungs:  CTAB Neck/HEME:  There are no carotid bruits bilaterally.  Neurological examination:  Orientation: The patient is alert and oriented x3. Cranial nerves: There is good facial symmetry with min facial hypomimia. The speech is fluent and clear. Soft palate rises symmetrically and there is no tongue deviation. Hearing is intact to conversational tone. Sensation: Sensation is intact to light touch throughout Motor: Strength is at least antigravity x4.  Movement examination: Tone: There is mod increased tone on the L (even though tremor is on the R) Abnormal movements: there is intermittent RUE rest tremor Coordination:  There is mod decremation, with any form of RAMS, including alternating supination and pronation of the forearm, hand opening and closing, finger taps, heel taps and toe taps, L>>R Gait and Station: The patient has significant difficulty arising out of a deep-seated chair without the use of the hands. The patient's stride length is decreased and he is off balanced even with the cane.  He does better the more he walks.  I  have reviewed and interpreted the following labs independently    Chemistry      Component Value Date/Time   NA 141 05/31/2022 1953   K 3.9 05/31/2022 1953   CL 104 05/31/2022 1953   CO2 29 05/31/2022 1953   BUN 15 05/31/2022 1953   CREATININE 0.89 05/31/2022 1953   CREATININE 0.98 12/13/2019 1124      Component Value Date/Time   CALCIUM 9.3 05/31/2022 1953   ALKPHOS 84 12/13/2019 1124   AST 14 (L) 12/13/2019 1124   ALT <6 12/13/2019 1124   BILITOT 1.0 12/13/2019 1124       Lab Results  Component Value Date   WBC 9.1 05/31/2022   HGB 13.6 05/31/2022   HCT 41.0 05/31/2022   MCV 92.3 05/31/2022   PLT 138 (L) 05/31/2022    Lab Results  Component Value Date   TSH 2.385 04/20/2019   Total time spent on today's visit was *** minutes, including both face-to-face time and nonface-to-face time.  Time included that spent on review of records (prior notes available  to me/labs/imaging if pertinent), discussing treatment and goals, answering patient's questions and coordinating care.    Cc:  Shon Baton, MD

## 2022-08-03 ENCOUNTER — Encounter: Payer: Self-pay | Admitting: Neurology

## 2022-08-03 ENCOUNTER — Ambulatory Visit: Payer: Medicare Other | Admitting: Neurology

## 2022-08-03 VITALS — BP 122/76 | HR 67 | Ht 66.0 in | Wt 150.6 lb

## 2022-08-03 DIAGNOSIS — G20 Parkinson's disease: Secondary | ICD-10-CM

## 2022-08-03 NOTE — Patient Instructions (Signed)
Local and Online Resources for Power over Parkinson's Group September 2023  LOCAL [place] PARKINSON'S GROUPS  Power over Parkinson's Group:   Power Over Parkinson's Patient Education Group will be Wednesday, September 13th-*Hybrid meting*- in person at [facility] location and via [facility] at 2 pm.   Upcoming Power over [employer] Meetings:  2nd Wednesdays of the month at 2 pm:  September 13th, October 11th, November 8th Contact Amy Marriott at [email or web address] if interested in participating in this group Parkinson's Care Partners Group:    3rd Mondays, Contact Misty Paladino Atypical Parkinsonian Patient Group:   4th Wednesdays, [place] If you are interested in participating in these groups with Misty, please contact her directly for how to join those meetings.  Her contact information is [email or web address].    LOCAL EVENTS AND NEW OFFERINGS New PWR! Moves [employer] Instructor-Led Classes offering at [employer]!  Wednesdays 1-2 pm.   Contact Vonna Kotyk at  [email or web address] or Caron Presume at [place], [email or web address] Dance for Parkinson 's classes will be on Tuesdays 9:30am-10:30am starting October 3-December 12 with a break the week of November 21 . Located in the [employer]  which is in the first floor of the [employer] ([place] for Parkinson's will be held on 2nd and 4th Mondays at 11:00am . First class will start  September 25th.  Located at the [place] ([place] Through support from the [place] and Drumming for Parkinson's classes are free for both patients and caregivers.  Contact Misty Taylor-Paladino for more details about registering.  [place]:  www.[email or web address] PD Health at Home continues:  Mindfulness Mondays, Wellness  Wednesdays, Fitness Fridays  Upcoming Education:   Navigating Nutrition with PD.  Wednesday, Sept. 6th 1:00-2:00 pm Understanding Mind and Memory.  Wednesday, Sept. 20th 1:00-2:00 pm  Expert Briefing:    Parkinson's Disease and the Bladder.  Wednesday, Sept. 13th 1:00-2:00 pm Parkinson's and the Gut-Brain Connection.  Wednesday, Oct. 11th 1:00-2:00 pm Register for expert briefings (webinars) at [email or web address] Please check out their website to sign up for emails and see their full online offerings   [place]:  [email or web address]  Third Thursday Webinars:  On the third Thursday of every month at 12 p.m. ET, join our free live webinars to learn about various aspects of living with Parkinson's disease and our work to speed medical breakthroughs. Upcoming Webinar:  Stay tuned Check out additional information on their website to see their full online offerings  [employer]:  [email or web address] Upcoming Webinar:   Stay tuned Webinar Series:  Living with Parkinson's Meetup.   Third Thursdays each month, 3 pm Care Partner Monthly Meetup.  With Robin Searing [place]ney.  First Tuesday of each month, 2 pm Check out additional information to Live Well Today on their website  Parkinson and Movement Disorders (PMD) Alliance:  www.[email or web address] [employer] Online:  Online Education Events Sign up for emails, which are sent weekly to give you updates on programming and online offerings  Parkinson's Association of the [place]:  www.[email or web address] Information on online support groups, education events, and online exercises including Yoga, Parkinson's exercises and more-LOTS of information on links to PD resources and online events Virtual Support Group through Parkinson's Association of the [place]; next one is scheduled for Wednesday, October 4th at 2 pm. (No September meeting  due to the symposium.  These are typically scheduled for the 1st Wednesday  of the month at 2 pm).  Visit website for details. Register for "Caring for Parkinson's-Caring for You", 9th Annual Symposium.  In-person event in [place].  September 9th.  To register:  [email or web address]_aid=[email or web address] MOVEMENT AND EXERCISE OPPORTUNITIES PWR! Moves Classes at [place].  Wednesdays 10 and 11 am.   Contact Amy Marriott, PT [email or web address] if interested. NEW PWR! Moves Class offerings at [employer].  Wednesdays 1-2 pm.  Contact Vonna Kotyk at  [email or web address] or Caron Presume at [place],  [email or web address] Parkinson's Wellness Recovery (PWR! Moves)  www.[email or web address] Info on the PWR! Virtual Experience:  You will have access to our expertise through self-assessment, guided plans that start with the PD-specific fundamentals, educational content, tips, Q&A with an expert, and a growing [employer] of PD-specific pre-recorded and live exercise classes of varying types and intensity - both physical and cognitive! If that is not enough, we offer 1:1 wellness consultations (in-person or virtual) to personalize your PWR! Art therapist.  [place] Fridays:  As part of the PD Health @ Home program, this free video series focuses each week on one aspect of fitness designed to support people living with Parkinson's.  These weekly videos highlight the [place] recent fitness guidelines for people with Parkinson's disease. [address] Dance for PD website is offering free, live-stream classes throughout the week, as well as links to [email or web address].si of classes:  [email or web address] Virtual dance and Pilates for Parkinson's classes: Click on the Community Tab> Parkinson's Movement Initiative Tab.  To register for classes and for more  information, visit www.[employer] and click the "community" tab.  [employer] Parkinson's Cycling Classes  [employer]:  Thursdays @ Noon-Live classes at 12-31-1983 ([employer] at [email or web address] or [phone]) [employer]: Virtual Classes Mondays and Thursdays 12-31-1983 classes Tuesday, Wednesday and Thursday (contact Pajonal at [email or web address] [email or web address]  or [phone]) [place] Varied levels of classes are offered Tuesdays and Thursdays at [facility].  Stretching with [facility] weekly class is also offered for people with Parkinson's To observe a class or for more information, call [phone] or email [phone] at [email or web address] ADDITIONAL SUPPORT AND RESOURCES [facility] [employer]:Online Caregiver Education Opportunities:  www.well-[email or web address]/caregiver-education/caregiver-support-group.  You may also contact Hezzie Bump at [email or web address] or [phone].    Coping with Difficult Caregiver Emotions.  Wednesday, September 20th, 10:30 am-12.  The [facility], [facility] Navigating the Maze of Senior Care Options.  Thursday, September 28th, 4-5:15 pm.  The [facility]. Well-Spring Navigator:  05-16-1995 program, a free service to help individuals and families through the journey of determining care for older adults.  The "Navigator" is a [facility], [employer], who will speak with a prospective client and/or loved ones to provide an assessment of the situation and a set of recommendations for a personalized care plan -- all free of charge, and whether [facility] offers the needed service or not. If the need is not a service we provide, we are well-connected with reputable programs in town that we can refer you to.  www.well-springsolutions.org or to speak with the Navigator, call [phone].

## 2022-09-14 ENCOUNTER — Other Ambulatory Visit: Payer: Self-pay | Admitting: Neurology

## 2022-09-14 DIAGNOSIS — G20A1 Parkinson's disease without dyskinesia, without mention of fluctuations: Secondary | ICD-10-CM

## 2022-09-14 NOTE — Telephone Encounter (Signed)
Has follow up 02/09/2023, last seen 08/03/2022

## 2022-12-07 ENCOUNTER — Ambulatory Visit: Payer: Medicare Other | Admitting: Neurology

## 2022-12-09 ENCOUNTER — Other Ambulatory Visit: Payer: Self-pay | Admitting: Neurology

## 2022-12-09 DIAGNOSIS — G20A1 Parkinson's disease without dyskinesia, without mention of fluctuations: Secondary | ICD-10-CM

## 2023-02-08 NOTE — Progress Notes (Unsigned)
Assessment/Plan:   1.  Parkinsons Disease  -pt had been backing down his doses of levodopa, and we had discussed the consequences of falls, and unfortunately he did suffer a subdural hematoma in July, 2023 as a consequence of a fall and dose reduction.   His complaints regarding higher dosages of levodopa were really complaints about disease, rather than medication side effects.    -He will continue carbidopa/levodopa 25/100 CR, 2/1/1.  2.  RBD  -No medication required right now.  Safety discussed.  3.  Left bundle branch block  -When patient was in the emergency room with the subdural hematoma, the emergency room noted left bundle branch block.  They were not sure if it contributed to the fall, but told them to follow-up with cardiology.  I do not see that that has been done but they state that PCP noted that he has had that finding on ekg for years and told them no need to f/u with cardiology.    4.  Dizziness  -discussed that tamsulosin and levodopa can both contribute  -increase water intake  -he has a hx of BPPV.  They decided to hold off on meclizine.   Subjective:   Jeremiah Zimmerman was seen today in follow up for Parkinsons disease.  My previous records were reviewed prior to todays visit as well as outside records available to me.  Patient accompanied by his daughter who supplements the history.  Last visit, we encouraged the patient to continue his carbidopa levodopa as directed and he reports today that ***.  He has had no near syncopal episodes.  Current prescribed movement disorder medications: Carbidopa/levodopa 25/100 CR, 2/1/1   ALLERGIES:   Allergies  Allergen Reactions   Erythromycin Nausea Only    REACTION: Intolerance to this medication with upset stomach   Nitroglycerin Other (See Comments)    REACTION: Intolerance to this medication.  It drops his BP too low and quickly.   Sulfa Antibiotics Nausea Only and Rash   Tetracyclines & Related Nausea Only    Amoxicillin Diarrhea and Nausea Only   Penicillins Swelling and Rash    Has patient had a PCN reaction causing immediate rash, facial/tongue/throat swelling, SOB or lightheadedness with hypotension: Yes Has patient had a PCN reaction causing severe rash involving mucus membranes or skin necrosis: No Has patient had a PCN reaction that required hospitalization: No Has patient had a PCN reaction occurring within the last 10 years: No If all of the above answers are "NO", then may proceed with Cephalosporin use.   REACTION: Patient develops a rash and swelling at inject    CURRENT MEDICATIONS:  Outpatient Encounter Medications as of 02/09/2023  Medication Sig   AMBULATORY NON FORMULARY MEDICATION Lift Chair Dx:  G20   AMBULATORY NON FORMULARY MEDICATION Rollator Dx: G20   Carbidopa-Levodopa ER (SINEMET CR) 25-100 MG tablet controlled release 2 IN THE MORNING, 1 AT NOON AND 1 AT DINNER   docusate sodium (COLACE) 100 MG capsule Take 100 mg by mouth 3 (three) times daily.   Multiple Vitamin (MULTIVITAMIN) capsule Take 1 capsule by mouth daily.   polyethylene glycol (MIRALAX) packet Take 17 g by mouth daily as needed for mild constipation. (Patient taking differently: Take 17 g by mouth daily.)   Tamsulosin HCl (FLOMAX) 0.4 MG CAPS Take 0.4 mg by mouth daily.    No facility-administered encounter medications on file as of 02/09/2023.    Objective:   PHYSICAL EXAMINATION:    VITALS:   There  were no vitals filed for this visit.       GEN:  The patient appears stated age and is in NAD. HEENT:  Normocephalic, atraumatic.  The mucous membranes are moist. The superficial temporal arteries are without ropiness or tenderness. CV:  RRR Lungs:  CTAB Neck/HEME:  There are no carotid bruits bilaterally.  Neurological examination:  Orientation: The patient is alert and oriented x3. Cranial nerves: There is good facial symmetry with min facial hypomimia. The speech is fluent and clear. Soft  palate rises symmetrically and there is no tongue deviation. Hearing is intact to conversational tone. Sensation: Sensation is intact to light touch throughout Motor: Strength is at least antigravity x4.  Movement examination: Tone: There is nl tone in the UE/LE (marked improvement) Abnormal movements: there is intermittent RUE rest tremor Coordination:  There is min decremation, with any form of RAMS, including alternating supination and pronation of the forearm, hand opening and closing, finger taps, heel taps and toe taps, L>R (marked improvement) Gait and Station: The patient pushes off to arise. The patient's stride length is good with the walker.  He turns en bloc.    I have reviewed and interpreted the following labs independently    Chemistry      Component Value Date/Time   NA 141 05/31/2022 1953   K 3.9 05/31/2022 1953   CL 104 05/31/2022 1953   CO2 29 05/31/2022 1953   BUN 15 05/31/2022 1953   CREATININE 0.89 05/31/2022 1953   CREATININE 0.98 12/13/2019 1124      Component Value Date/Time   CALCIUM 9.3 05/31/2022 1953   ALKPHOS 84 12/13/2019 1124   AST 14 (L) 12/13/2019 1124   ALT <6 12/13/2019 1124   BILITOT 1.0 12/13/2019 1124       Lab Results  Component Value Date   WBC 9.1 05/31/2022   HGB 13.6 05/31/2022   HCT 41.0 05/31/2022   MCV 92.3 05/31/2022   PLT 138 (L) 05/31/2022    Lab Results  Component Value Date   TSH 2.385 04/20/2019   Total time spent on today's visit was *** minutes, including both face-to-face time and nonface-to-face time.  Time included that spent on review of records (prior notes available to me/labs/imaging if pertinent), discussing treatment and goals, answering patient's questions and coordinating care.    Cc:  Shon Baton, MD

## 2023-02-09 ENCOUNTER — Ambulatory Visit: Payer: Medicare Other | Admitting: Neurology

## 2023-02-09 ENCOUNTER — Encounter: Payer: Self-pay | Admitting: Neurology

## 2023-02-09 VITALS — BP 124/72 | HR 63 | Ht 70.0 in | Wt 147.8 lb

## 2023-02-09 DIAGNOSIS — G20A1 Parkinson's disease without dyskinesia, without mention of fluctuations: Secondary | ICD-10-CM | POA: Diagnosis not present

## 2023-02-09 NOTE — Patient Instructions (Addendum)
Wake up by 10am! No naps after 2pm and naps should not last longer than 45 min We discussed eating Halo Top Ice Cream and we discussed trying Muscle Milk for added protein.    Local and Online Resources for Power over Parkinson's Group  March 2024   LOCAL [place] PARKINSON'S GROUPS   Power over Parkinson's Group:    Power Over Parkinson's Patient Education Group will be Wednesday, March 13th-*Hybrid meting*- in person at [facility] location and via [facility], 2:00-3:00 pm.   Starting in November 2023, Power over [employer] and Care Partner Groups will meet together, with plans for separate break out session for caregivers (*this will be evolving over the next few months) Upcoming Power over Parkinson's Meetings/Care Partner Support:  2nd Wednesdays of the month at 2 pm:  March 13th, April 10th [place] at [email or web address] if interested in participating in this group    [place] OFFERINGS  Let's Try Pickleball-$25 for 6 weeks of Pickleball, starting February 2nd.  Contact Corwin Levins for more details.  [email or web address] NEW:  Parkinson's Social Game Night.  First Thursday of each month, 2:00-4:00 pm.  *Next date is MARCH 7th*.  [place], [employer].  Contact [email or web address] if interested. Parkinson's CarePartner Group for Men is in the works, if interested email [email or web address] ACT FITNESS Chair Yoga classes "Train and Gain", Fridays 10 am, [employer].  Contact Gina at [phone].  PWR! Moves [employer] Instructor-Led Classes offering at [employer]!  TUESDAYS and Wednesdays 1-2 pm.   Contact Vonna Kotyk at  [email or web address]  or [phone] (Tuesday classes are modified for chair and standing only) Drumming for Parkinson's will be held on 2nd and 4th Mondays at 11:00 am.   Located at the [place] ([place].)  Contact Doylene Canning at [email or web address] or [phone]  Dance for Parkinson 's classes will be on Tuesdays 10-11 am starting in February. Located in the [employer] , in the first floor of the [employer] ([place].) To register:  [email or web address] or [phone] [facility] Optima Class, Mondays at 11 am.  Call [phone] for details Moving Day [facility].  Saturday, May 4th, 10 am start.  Register at 01-17-1990.[place]:  www.[email or web address].org  PD Health at Home continues:  Mindfulness Mondays, Wellness Wednesdays, Fitness Fridays  (PWR! Moves as part of Fitness Fridays March 22nd, 1-1:45 pm) Upcoming Education:   Managing "Off" Periods:  Return of Parkinson's Symptoms.  Wednesday, March 20th, 1-2 pm Parkinson's 101.  Wednesday, April 3rd, 1-2 pm Expert Briefing:  Understanding Pain in Parkinson's.   Wednesday, March 13th, 1-2 pm  Research Update:  Working to 02-13-1976 PD.  Wednesday, April 10th, 1-2 pm Register for virtual education and 03-14-1998 (webinars) at Patent attorney Please check out their website to sign up for emails and see their full online offerings     [place]:  [email or web address]   Third Thursday Webinars:  On the third Thursday of every month at 12 p.m. ET, join our free live webinars to learn about various aspects of living with Parkinson's disease and our work to speed medical breakthroughs.  Upcoming Webinar:  Everyday Exposure to Parkinson's:  Environmental Connections to the Disease.  Thursday, March 21st at 12 noon. Check out additional information on their website to see their full online offerings    02-16-1987  Foundation:  www.[email or web address]  Upcoming Webinar:   Nutrition and Parkinson's.  Wednesday, March 6th, 12 noon Webinar Series:  Living with  Parkinson's Meetup.   Third Thursdays each month, 3 pm  Care Partner Monthly Meetup.  With Robin Searing Phinney.  First Tuesday of each month, 2 pm  Check out additional information to Live Well Today on their website    Parkinson and Movement Disorders (PMD) Alliance:  www.pmdalliance.org  [employer] Online:  Online Education Events  Sign up for emails, which are sent weekly to give you updates on programming and online offerings    Parkinson's Association of the [place]:  www.parkinsonassociation.org  Information on online support groups, education events, and online exercises including Yoga, Parkinson's exercises and more-LOTS of information on links to PD resources and online events  Virtual Support Group through Parkinson's Association of the [place]; next one is scheduled for Wednesday, March 6th  MOVEMENT AND EXERCISE OPPORTUNITIES  PWR! Moves Classes at [place].  Wednesdays 10 and 11 am.   Contact Amy Marriott, PT [email or web address] if interested.  PWR! Moves Class offerings at [employer]. *TUESDAYS* and Wednesdays 1-2 pm.    Contact Vonna Kotyk at  [email or web address]    Parkinson's Wellness Recovery (PWR! Moves)  www.[email or web address]  Info on the PWR! Virtual Experience:  You will have access to our expertise?through self-assessment, guided plans that start with the PD-specific fundamentals, educational content, tips, Q&A with an expert, and a growing Art therapist of PD-specific pre-recorded and live exercise classes of varying types and intensity - both physical and cognitive! If that is not enough, we offer 1:1 wellness consultations (in-person or virtual) to personalize your PWR! Research scientist (medical).   [place] Fridays:   As part of the PD Health @ Home program, this free video series focuses each week on one aspect of fitness designed to support people living with Parkinson's.? These weekly videos highlight the [place] fitness guidelines for people with Parkinson's disease.  ModemGamers.si  Dance for PD website is offering free, live-stream classes throughout the week, as well as links to [employer] of classes:  [email or web address]  Virtual dance and Pilates for Parkinson's classes: Click on the Community Tab> Parkinson's Movement Initiative Tab.  To register for classes and for more information, visit [email or web address] and click the "community" tab.   [employer] Parkinson's Cycling Classes   [employer]:  Thursdays @ Noon-Live classes at [employer] ([employer] at [email or web address]?or [phone])  [employer]: Virtual Classes Mondays and Thursdays Jeanette Caprice classes Tuesday, Wednesday and Thursday (contact [employer] at [email or web address] ?or [phone])  [place]  Varied levels of classes are offered Tuesdays and Thursdays at [employer].   Stretching with Verdis Frederickson weekly class is also offered for people with Parkinson's  To observe a class or for more information, call [phone] or email Hezzie Bump at [email or web address]   ADDITIONAL SUPPORT AND RESOURCES  Well[employer]Spring [employer]:Online Caregiver Education Opportunities:  www.well-springsolutions.org/caregiver-education/caregiver-support-group.  You may also contact Vickki Muff at [email or web address] or [phone].     Family Caregiver [phone].  Thursday, March 7th, 10:15-1:45 at [facility].  Register with [facility] (see above) Well-Spring Navigator:  Just1Navigator program, a?free service to help individuals and families through the journey of determining care for older adults.  The "Navigator" is a Vickki Muff, Education officer, museum, who will speak with a prospective client and/or loved ones to provide an assessment of the situation and a set of recommendations for a personalized  care plan -- all free of  charge, and whether?Well-Spring [employer] offers the needed service or not. If the need is not a service we provide, we are well-connected with reputable programs in town that we can refer you to.  www.well-springsolutions.org or to speak with the Navigator, call [phone].

## 2023-03-02 ENCOUNTER — Other Ambulatory Visit: Payer: Self-pay | Admitting: Neurology

## 2023-03-02 DIAGNOSIS — G20A1 Parkinson's disease without dyskinesia, without mention of fluctuations: Secondary | ICD-10-CM

## 2023-05-29 ENCOUNTER — Other Ambulatory Visit: Payer: Self-pay | Admitting: Physician Assistant

## 2023-05-29 DIAGNOSIS — G20A1 Parkinson's disease without dyskinesia, without mention of fluctuations: Secondary | ICD-10-CM

## 2023-06-08 ENCOUNTER — Ambulatory Visit: Payer: Medicare Other | Admitting: Podiatry

## 2023-06-08 DIAGNOSIS — M79675 Pain in left toe(s): Secondary | ICD-10-CM | POA: Diagnosis not present

## 2023-06-08 DIAGNOSIS — B351 Tinea unguium: Secondary | ICD-10-CM

## 2023-06-08 DIAGNOSIS — M79674 Pain in right toe(s): Secondary | ICD-10-CM | POA: Diagnosis not present

## 2023-06-13 NOTE — Progress Notes (Signed)
Subjective:   Patient ID: Jeremiah Zimmerman, male   DOB: 89 y.o.   MRN: [id number]   HPI Chief Complaint  Patient presents with   Toe Pain    Pt states that the 2nd toe nails on both feet are thick, he tried trimming them down but they kept coming back.     89 year old male presents with his daughter for the above concerns.  Nails are thickened discolored and not able to trim himself and is causing discomfort.  No swelling, redness or drainage.  No conditions.  No other concerns.   Review of Systems  All other systems reviewed and are negative.  Past Medical History:  Diagnosis Date   Adenomatous colon polyp    Bladder cancer (HCC) 2010   BPH (benign prostatic hyperplasia)    Heart murmur    Hyperlipidemia    Hypertension    MVP (mitral valve prolapse) 01-13-13   hx. LBBB previous EKG 11'11  in Epic.   Parkinson's disease    SBO (small bowel obstruction) (HCC) 07/18/2018   Seasonal allergies 01-13-13    Past Surgical History:  Procedure Laterality Date   APPENDECTOMY     BLADDER SURGERY  2010   bladder cancer in 2010/Post surgery bleed   CARDIAC CATHETERIZATION  01-13-13   greater than 5 yrs ago-negative findings   CHOLECYSTECTOMY N/A 01/27/2017   Procedure: LAPAROSCOPIC CHOLECYSTECTOMY WITH INTRAOPERATIVE CHOLANGIOGRAM;  Surgeon: Emelia Loron, MD;  Location: [facility] OR;  Service: General;  Laterality: N/A;   COLONOSCOPY  01-13-13   hx.colon polyps excised-last 3-4 yrs ago   ELBOW SURGERY     left   KNEE ARTHROSCOPY WITH MEDIAL MENISECTOMY Right 01/20/2013   Procedure: KNEE ARTHROSCOPY WITH MEDIAL MENISECTOMY;  Surgeon: Loanne Drilling, MD;  Location: [facility] ORS;  Service: Orthopedics;  Laterality: Right;  right knee arthroscopy with medial meniscal debridement and chrondroplasty   TONSILLECTOMY     TONSILLECTOMY AND ADENOIDECTOMY       Current Outpatient Medications:    AMBULATORY NON FORMULARY MEDICATION, Lift Chair Dx:  G20, Disp: 1 Device, Rfl: 0   AMBULATORY NON FORMULARY  MEDICATION, Rollator Dx: G20, Disp: 1 Device, Rfl: 0   Carbidopa-Levodopa ER (SINEMET CR) 25-100 MG tablet controlled release, 2 IN THE MORNING, 1 AT NOON AND 1 AT DINNER, Disp: 360 tablet, Rfl: 0   docusate sodium (COLACE) 100 MG capsule, Take 100 mg by mouth 3 (three) times daily., Disp: , Rfl:    Multiple Vitamin (MULTIVITAMIN) capsule, Take 1 capsule by mouth daily., Disp: , Rfl:    polyethylene glycol (MIRALAX) packet, Take 17 g by mouth daily as needed for mild constipation. (Patient taking differently: Take 17 g by mouth daily.), Disp: , Rfl: 0   Tamsulosin HCl (FLOMAX) 0.4 MG CAPS, Take 0.4 mg by mouth daily. , Disp: , Rfl:   Allergies  Allergen Reactions   Erythromycin Nausea Only    REACTION: Intolerance to this medication with upset stomach   Nitroglycerin Other (See Comments)    REACTION: Intolerance to this medication.  It drops his BP too low and quickly.   Sulfa Antibiotics Nausea Only and Rash   Tetracyclines & Related Nausea Only   Amoxicillin Diarrhea and Nausea Only   Penicillins Swelling and Rash    Has patient had a PCN reaction causing immediate rash, facial/tongue/throat swelling, SOB or lightheadedness with hypotension: Yes Has patient had a PCN reaction causing severe rash involving mucus membranes or skin necrosis: No Has patient had a PCN reaction that  required hospitalization: No Has patient had a PCN reaction occurring within the last 10 years: No If all of the above answers are "NO", then may proceed with Cephalosporin use.   REACTION: Patient develops a rash and swelling at inject          Objective:  Physical Exam  General: AAO x3, NAD  Dermatological: Nails are hypertrophic, dystrophic, brittle, discolored, elongated 10.  Second nails are most notably hypertrophic.  No surrounding redness or drainage. Tenderness nails 1-5 bilaterally. No open lesions or pre-ulcerative lesions are identified today.   Vascular: Dorsalis Pedis artery and Posterior  Tibial artery pedal pulses are palpable bilateral with immedate capillary fill time.  There is no pain with calf compression, swelling, warmth, erythema.   Neruologic: Grossly intact via light touch bilateral.   Musculoskeletal: No other areas of discomfort.    Assessment:   Symptomatic onychomycosis     Plan:  -Treatment options discussed including all alternatives, risks, and complications -Etiology of symptoms were discussed -Nails debrided 10 without complications or bleeding. -Daily foot inspection -Follow-up in 3 months or sooner if any problems arise. In the meantime, encouraged to call the office with any questions, concerns, change in symptoms.   Ovid Curd, DPM

## 2023-08-09 NOTE — Progress Notes (Unsigned)
Assessment/Plan:   1.  Parkinsons Disease  -In the past, pt had been backing down his doses of levodopa, and we had discussed the consequences of falls, and unfortunately he did suffer a subdural hematoma in July, 2023 as a consequence of a fall and dose reduction.   His complaints regarding higher dosages of levodopa were really complaints about disease, rather than medication side effects.    -He will continue carbidopa/levodopa 25/100 CR, 2/1/1.  -discussed extensively proper schedule.  He has a long nap in the late afternoon (2-5pm) and this is a bit too long.  2.  RBD  -No medication required right now.  Safety discussed.  3.  Dizziness  -improving  -discussed that tamsulosin and levodopa can both contribute  -increase water intake  -he has a hx of BPPV.    4.  Weight loss  -Discussed protein intake and various protein sources that may be agreeable to the patient.  Discussed ice cream, boost, ensure, etc  -offered mirtazapine to help stimulate weight gain but he declined for now.   Subjective:   Jeremiah Zimmerman was seen today in follow up for Parkinsons disease.  My previous records were reviewed prior to todays visit as well as outside records available to me.  Patient accompanied by his daughter who supplements the history.  We have been encouraging the patient to take the medication on time and at regular timings.  We have also been encouraging proper sleep schedule and getting out of the bed before 10 AM.  Primary care notes that are available to me are reviewed.  His blood pressures have been in the low/normal range.  He is still having trouble with weight loss but hasn't been doing the increased protein that we discussed last visit.  He did talk to his primary care recently about the weight loss, and boost was recommended again, and he just recently started to do that  Current prescribed movement disorder medications: Carbidopa/levodopa 25/100 CR, 2/1/1   ALLERGIES:    Allergies  Allergen Reactions   Erythromycin Nausea Only    REACTION: Intolerance to this medication with upset stomach   Nitroglycerin Other (See Comments)    REACTION: Intolerance to this medication.  It drops his BP too low and quickly.   Sulfa Antibiotics Nausea Only and Rash   Tetracyclines & Related Nausea Only   Amoxicillin Diarrhea and Nausea Only   Penicillins Swelling and Rash    Has patient had a PCN reaction causing immediate rash, facial/tongue/throat swelling, SOB or lightheadedness with hypotension: Yes Has patient had a PCN reaction causing severe rash involving mucus membranes or skin necrosis: No Has patient had a PCN reaction that required hospitalization: No Has patient had a PCN reaction occurring within the last 10 years: No If all of the above answers are "NO", then may proceed with Cephalosporin use.   REACTION: Patient develops a rash and swelling at inject    CURRENT MEDICATIONS:  Outpatient Encounter Medications as of 08/10/2023  Medication Sig   Carbidopa-Levodopa ER (SINEMET CR) 25-100 MG tablet controlled release 2 IN THE MORNING, 1 AT NOON AND 1 AT DINNER   docusate sodium (COLACE) 100 MG capsule Take 100 mg by mouth 3 (three) times daily.   Multiple Vitamin (MULTIVITAMIN) capsule Take 1 capsule by mouth daily.   polyethylene glycol (MIRALAX) packet Take 17 g by mouth daily as needed for mild constipation. (Patient taking differently: Take 17 g by mouth daily.)   Tamsulosin HCl (FLOMAX) 0.4  MG CAPS Take 0.4 mg by mouth daily.    AMBULATORY NON FORMULARY MEDICATION Lift Chair Dx:  G20   AMBULATORY NON FORMULARY MEDICATION Rollator Dx: G20   No facility-administered encounter medications on file as of 08/10/2023.    Objective:   PHYSICAL EXAMINATION:    VITALS:   Vitals:   08/10/23 1501  BP: 124/74  Pulse: 73  SpO2: 96%  Weight: 142 lb 3.2 oz (64.5 kg)    Wt Readings from Last 3 Encounters:  08/10/23 142 lb 3.2 oz (64.5 kg)  02/09/23 147  lb 12.8 oz (67 kg)  08/03/22 150 lb 9.6 oz (68.3 kg)     GEN:  The patient appears stated age and is in NAD. HEENT:  Normocephalic, atraumatic.  The mucous membranes are moist. The superficial temporal arteries are without ropiness or tenderness. CV:  RRR Lungs:  CTAB Neck/HEME:  There are no carotid bruits bilaterally.  Neurological examination:  Orientation: The patient is alert and oriented x3. Cranial nerves: There is good facial symmetry with min facial hypomimia. The speech is fluent and clear. Soft palate rises symmetrically and there is no tongue deviation. Hearing is intact to conversational tone. Sensation: Sensation is intact to light touch throughout Motor: Strength is at least antigravity x4.  Movement examination: Tone: There is nl tone in the UE/LE  Abnormal movements: there is RUE rest tremor Coordination:  There is min decremation, with any form of RAMS, including alternating supination and pronation of the forearm, hand opening and closing, finger taps, heel taps and toe taps, L>R.  This is most prominent with the toe taps on the L  Gait and Station: The patient pushes off to arise. The patient's stride length is good with the walker.  He turns en bloc.    I have reviewed and interpreted the following labs independently    Chemistry      Component Value Date/Time   NA 141 05/31/2022 1953   K 3.9 05/31/2022 1953   CL 104 05/31/2022 1953   CO2 29 05/31/2022 1953   BUN 15 05/31/2022 1953   CREATININE 0.89 05/31/2022 1953   CREATININE 0.98 12/13/2019 1124      Component Value Date/Time   CALCIUM 9.3 05/31/2022 1953   ALKPHOS 84 12/13/2019 1124   AST 14 (L) 12/13/2019 1124   ALT <6 12/13/2019 1124   BILITOT 1.0 12/13/2019 1124       Lab Results  Component Value Date   WBC 9.1 05/31/2022   HGB 13.6 05/31/2022   HCT 41.0 05/31/2022   MCV 92.3 05/31/2022   PLT 138 (L) 05/31/2022    Lab Results  Component Value Date   TSH 2.385 04/20/2019   Total  time spent on today's visit was 30 minutes, including both face-to-face time and nonface-to-face time.  Time included that spent on review of records (prior notes available to me/labs/imaging if pertinent), discussing treatment and goals, answering patient's questions and coordinating care.    Cc:  Creola Corn, MD

## 2023-08-10 ENCOUNTER — Ambulatory Visit: Payer: Medicare Other | Admitting: Neurology

## 2023-08-10 ENCOUNTER — Encounter: Payer: Self-pay | Admitting: Neurology

## 2023-08-10 VITALS — BP 124/74 | HR 73 | Wt 142.2 lb

## 2023-08-10 DIAGNOSIS — G20A1 Parkinson's disease without dyskinesia, without mention of fluctuations: Secondary | ICD-10-CM | POA: Diagnosis not present

## 2023-08-10 DIAGNOSIS — R634 Abnormal weight loss: Secondary | ICD-10-CM | POA: Diagnosis not present

## 2023-08-10 NOTE — Patient Instructions (Signed)
SAVE THE DATE!  We are planning a Parkinsons Disease educational symposium at [facility] in [place] on October 11.  To sign up, you can email [email or web address].  We hope to see you there!

## 2023-08-25 ENCOUNTER — Other Ambulatory Visit: Payer: Self-pay | Admitting: Neurology

## 2023-08-25 DIAGNOSIS — G20A1 Parkinson's disease without dyskinesia, without mention of fluctuations: Secondary | ICD-10-CM

## 2023-11-21 ENCOUNTER — Other Ambulatory Visit: Payer: Self-pay | Admitting: Neurology

## 2023-11-21 DIAGNOSIS — G20A1 Parkinson's disease without dyskinesia, without mention of fluctuations: Secondary | ICD-10-CM

## 2024-02-09 NOTE — Progress Notes (Unsigned)
 Assessment/Plan:   1.  Parkinsons Disease  -In the past, pt had been backing down his doses of levodopa, and we had discussed the consequences of falls, and unfortunately he did suffer a subdural hematoma in July, 2023 as a consequence of a fall and dose reduction.   His complaints regarding higher dosages of levodopa were really complaints about disease, rather than medication side effects.  He has since been taking his medication faithfully  -Increase carbidopa/levodopa 25/100 CR, 2/2/2.  -Discussed again his sleep schedule and really would like to avoid such a long nap at the end of the day (2 to 3 hours).  -Discussed exercise, but he is a bit resistant to that.  2.  RBD  -No medication required right now.  Safety discussed.  3.   Weight loss  -Fairly stable since last visit.  We did discuss proper diet and protein intake.  -offered mirtazapine to help stimulate weight gain but he declined for now (today and last visit)   Subjective:   Jeremiah Zimmerman was seen today in follow up for Parkinsons disease.  My previous records were reviewed prior to todays visit as well as outside records available to me.  Patient accompanied by his daughter who supplements the history.  He has had no falls since last visit.  No lightheadedness or near syncope.  We have been encouraging the patient not to take the 3-hour nap in the afternoon and reports today that he is still taking that 2-3 hour nap.  Current prescribed movement disorder medications: Carbidopa/levodopa 25/100 CR, 2/1/1   ALLERGIES:   Allergies  Allergen Reactions   Erythromycin Nausea Only    REACTION: Intolerance to this medication with upset stomach   Nitroglycerin Other (See Comments)    REACTION: Intolerance to this medication.  It drops his BP too low and quickly.   Sulfa Antibiotics Nausea Only and Rash   Tetracyclines & Related Nausea Only   Amoxicillin Diarrhea and Nausea Only   Penicillins Swelling and Rash    Has  patient had a PCN reaction causing immediate rash, facial/tongue/throat swelling, SOB or lightheadedness with hypotension: Yes Has patient had a PCN reaction causing severe rash involving mucus membranes or skin necrosis: No Has patient had a PCN reaction that required hospitalization: No Has patient had a PCN reaction occurring within the last 10 years: No If all of the above answers are "NO", then may proceed with Cephalosporin use.   REACTION: Patient develops a rash and swelling at inject    CURRENT MEDICATIONS:  Outpatient Encounter Medications as of 02/10/2024  Medication Sig   AMBULATORY NON FORMULARY MEDICATION Lift Chair Dx:  G20   AMBULATORY NON FORMULARY MEDICATION Rollator Dx: G20   Carbidopa-Levodopa ER (SINEMET CR) 25-100 MG tablet controlled release TAKE 2 TABS IN THE MORNING, TAKE 1 TAB AT NOON AND 1 TAB AT DINNER   docusate sodium (COLACE) 100 MG capsule Take 100 mg by mouth 3 (three) times daily.   Multiple Vitamin (MULTIVITAMIN) capsule Take 1 capsule by mouth daily.   polyethylene glycol (MIRALAX) packet Take 17 g by mouth daily as needed for mild constipation. (Patient taking differently: Take 17 g by mouth daily.)   Tamsulosin HCl (FLOMAX) 0.4 MG CAPS Take 0.4 mg by mouth daily.    No facility-administered encounter medications on file as of 02/10/2024.    Objective:   PHYSICAL EXAMINATION:    VITALS:   Vitals:   02/10/24 1441  BP: 124/60  Pulse: 81  SpO2: 96%  Weight: 142 lb (64.4 kg)  Height: 5\' 10"  (1.778 m)     Wt Readings from Last 3 Encounters:  02/10/24 142 lb (64.4 kg)  08/10/23 142 lb 3.2 oz (64.5 kg)  02/09/23 147 lb 12.8 oz (67 kg)     GEN:  The patient appears stated age and is in NAD. HEENT:  Normocephalic, atraumatic.  The mucous membranes are moist. The superficial temporal arteries are without ropiness or tenderness. CV:  RRR Lungs:  CTAB Neck/HEME:  There are no carotid bruits bilaterally.  Neurological  examination:  Orientation: The patient is alert and oriented x3. Cranial nerves: There is good facial symmetry with min facial hypomimia. The speech is fluent and clear. Soft palate rises symmetrically and there is no tongue deviation. Hearing is intact to conversational tone. Sensation: Sensation is intact to light touch throughout Motor: Strength is at least antigravity x4.  Movement examination: Tone: There is mild increased tone in the RUE Abnormal movements: there is RUE rest tremor Coordination:  There is mild decremation, with any form of RAMS, including alternating supination and pronation of the forearm, hand opening and closing, finger taps, heel taps and toe taps, bilaterally Gait and Station: The patient pushes off to arise. The patient's stride length is good with the walker.  He turns en bloc.  This is the same as previous  I have reviewed and interpreted the following labs independently    Chemistry      Component Value Date/Time   NA 141 05/31/2022 1953   K 3.9 05/31/2022 1953   CL 104 05/31/2022 1953   CO2 29 05/31/2022 1953   BUN 15 05/31/2022 1953   CREATININE 0.89 05/31/2022 1953   CREATININE 0.98 12/13/2019 1124      Component Value Date/Time   CALCIUM 9.3 05/31/2022 1953   ALKPHOS 84 12/13/2019 1124   AST 14 (L) 12/13/2019 1124   ALT <6 12/13/2019 1124   BILITOT 1.0 12/13/2019 1124       Lab Results  Component Value Date   WBC 9.1 05/31/2022   HGB 13.6 05/31/2022   HCT 41.0 05/31/2022   MCV 92.3 05/31/2022   PLT 138 (L) 05/31/2022    Lab Results  Component Value Date   TSH 2.385 04/20/2019   Total time spent on today's visit was 30 minutes, including both face-to-face time and nonface-to-face time.  Time included that spent on review of records (prior notes available to me/labs/imaging if pertinent), discussing treatment and goals, answering patient's questions and coordinating care.    Cc:  Creola Corn, MD

## 2024-02-10 ENCOUNTER — Ambulatory Visit: Payer: Medicare Other | Admitting: Neurology

## 2024-02-10 ENCOUNTER — Encounter: Payer: Self-pay | Admitting: Neurology

## 2024-02-10 DIAGNOSIS — G20A1 Parkinson's disease without dyskinesia, without mention of fluctuations: Secondary | ICD-10-CM

## 2024-02-10 MED ORDER — CARBIDOPA-LEVODOPA ER 25-100 MG PO TBCR: 2.0000 | EXTENDED_RELEASE_TABLET | Freq: Three times a day (TID) | ORAL | 1 refills | Status: DC

## 2024-02-10 NOTE — Patient Instructions (Signed)
 Increase carbidopa/levodopa 25/100 CR, 2 tablets three times per day at 8am/noon/4pm

## 2024-02-15 ENCOUNTER — Other Ambulatory Visit: Payer: Self-pay | Admitting: Neurology

## 2024-02-15 DIAGNOSIS — G20A1 Parkinson's disease without dyskinesia, without mention of fluctuations: Secondary | ICD-10-CM

## 2024-04-05 ENCOUNTER — Emergency Department (HOSPITAL_COMMUNITY)
Admission: EM | Admit: 2024-04-05 | Discharge: 2024-04-05 | Disposition: A | Discharge status: Home / Self Care | Attending: Emergency Medicine | Admitting: Emergency Medicine

## 2024-04-05 ENCOUNTER — Emergency Department (HOSPITAL_COMMUNITY)

## 2024-04-05 ENCOUNTER — Other Ambulatory Visit: Payer: Self-pay

## 2024-04-05 ENCOUNTER — Encounter (HOSPITAL_COMMUNITY): Payer: Self-pay

## 2024-04-05 DIAGNOSIS — I10 Essential (primary) hypertension: Secondary | ICD-10-CM | POA: Insufficient documentation

## 2024-04-05 DIAGNOSIS — G20A1 Parkinson's disease without dyskinesia, without mention of fluctuations: Secondary | ICD-10-CM | POA: Insufficient documentation

## 2024-04-05 DIAGNOSIS — S0990XA Unspecified injury of head, initial encounter: Secondary | ICD-10-CM | POA: Diagnosis not present

## 2024-04-05 DIAGNOSIS — S161XXA Strain of muscle, fascia and tendon at neck level, initial encounter: Secondary | ICD-10-CM | POA: Insufficient documentation

## 2024-04-05 DIAGNOSIS — W19XXXA Unspecified fall, initial encounter: Secondary | ICD-10-CM | POA: Diagnosis not present

## 2024-04-05 DIAGNOSIS — Y92009 Unspecified place in unspecified non-institutional (private) residence as the place of occurrence of the external cause: Secondary | ICD-10-CM | POA: Diagnosis not present

## 2024-04-05 DIAGNOSIS — R519 Headache, unspecified: Secondary | ICD-10-CM | POA: Diagnosis present

## 2024-04-05 LAB — URINALYSIS, W/ REFLEX TO CULTURE (INFECTION SUSPECTED)
Bacteria, UA: NONE SEEN
Bilirubin Urine: NEGATIVE
Glucose, UA: NEGATIVE mg/dL
Hgb urine dipstick: NEGATIVE
Ketones, ur: 5 mg/dL — AB
Leukocytes,Ua: NEGATIVE
Nitrite: NEGATIVE
Protein, ur: NEGATIVE mg/dL
Specific Gravity, Urine: 1.017 (ref 1.005–1.030)
pH: 7 (ref 5.0–8.0)

## 2024-04-05 LAB — CBC WITH DIFFERENTIAL/PLATELET
Abs Immature Granulocytes: 0.03 10*3/uL (ref 0.00–0.07)
Basophils Absolute: 0 10*3/uL (ref 0.0–0.1)
Basophils Relative: 0 %
Eosinophils Absolute: 0.1 10*3/uL (ref 0.0–0.5)
Eosinophils Relative: 1 %
HCT: 44 % (ref 39.0–52.0)
Hemoglobin: 14 g/dL (ref 13.0–17.0)
Immature Granulocytes: 1 %
Lymphocytes Relative: 13 %
Lymphs Abs: 0.8 10*3/uL (ref 0.7–4.0)
MCH: 30.3 pg (ref 26.0–34.0)
MCHC: 31.8 g/dL (ref 30.0–36.0)
MCV: 95.2 fL (ref 80.0–100.0)
Monocytes Absolute: 0.5 10*3/uL (ref 0.1–1.0)
Monocytes Relative: 8 %
Neutro Abs: 5 10*3/uL (ref 1.7–7.7)
Neutrophils Relative %: 77 %
Platelets: 141 10*3/uL — ABNORMAL LOW (ref 150–400)
RBC: 4.62 MIL/uL (ref 4.22–5.81)
RDW: 12 % (ref 11.5–15.5)
WBC: 6.4 10*3/uL (ref 4.0–10.5)
nRBC: 0 % (ref 0.0–0.2)

## 2024-04-05 LAB — COMPREHENSIVE METABOLIC PANEL WITH GFR
ALT: 7 U/L (ref 0–44)
AST: 16 U/L (ref 15–41)
Albumin: 3.7 g/dL (ref 3.5–5.0)
Alkaline Phosphatase: 72 U/L (ref 38–126)
Anion gap: 7 (ref 5–15)
BUN: 17 mg/dL (ref 8–23)
CO2: 27 mmol/L (ref 22–32)
Calcium: 9 mg/dL (ref 8.9–10.3)
Chloride: 106 mmol/L (ref 98–111)
Creatinine, Ser: 0.68 mg/dL (ref 0.61–1.24)
GFR, Estimated: >60 mL/min (ref >60)
Glucose, Bld: 108 mg/dL — ABNORMAL HIGH (ref 70–99)
Potassium: 4.1 mmol/L (ref 3.5–5.1)
Sodium: 140 mmol/L (ref 135–145)
Total Bilirubin: 1.1 mg/dL (ref 0.0–1.2)
Total Protein: 6.9 g/dL (ref 6.5–8.1)

## 2024-04-05 LAB — TROPONIN I (HIGH SENSITIVITY): Troponin I (High Sensitivity): 6 ng/L (ref <18)

## 2024-04-05 MED ORDER — ACETAMINOPHEN 500 MG PO TABS
1000.0000 mg | ORAL_TABLET | Freq: Once | ORAL | Status: AC
  Administered 2024-04-05: 1000 mg via ORAL
  Filled 2024-04-05: qty 2

## 2024-04-05 MED ORDER — SODIUM CHLORIDE 0.9 % IV BOLUS
500.0000 mL | Freq: Once | INTRAVENOUS | Status: AC
  Administered 2024-04-05: 500 mL via INTRAVENOUS

## 2024-04-05 MED ORDER — KETOROLAC TROMETHAMINE 15 MG/ML IJ SOLN
15.0000 mg | Freq: Once | INTRAMUSCULAR | Status: AC
  Administered 2024-04-05: 15 mg via INTRAVENOUS
  Filled 2024-04-05: qty 1

## 2024-04-05 NOTE — ED Provider Notes (Signed)
 Emergency Department Provider Note   I have reviewed the triage vital signs and the nursing notes.   HISTORY  Chief Complaint Fall and Head Injury (Pain to Back of Head)   HPI Jeremiah Zimmerman is a 89 y.o. male with past history of Parkinson's presents to the emergency department presents to the Emergency Department for evaluation after unwitnessed fall at home.  He lives at home with his wife help from his multiple children.  He got up and went to the bathroom this morning or stumbled and fell. This was not witnessed by family and patient does not recall. He does have some memory issues related to Parkinson's disease. Daughter at bedside reports he is at his baseline mental status. He reports pain to the back of his head. No new medications. No fever. No UTI symptoms. No current CP.   Past Medical History:  Diagnosis Date   Adenomatous colon polyp    Bladder cancer (HCC) 2010   BPH (benign prostatic hyperplasia)    Heart murmur    Hyperlipidemia    Hypertension    MVP (mitral valve prolapse) 01-13-13   hx. LBBB previous EKG 11'11  in Epic.   Parkinson's disease (HCC)    SBO (small bowel obstruction) (HCC) 07/18/2018   Seasonal allergies 01-13-13    Review of Systems  Constitutional: No fever/chills Cardiovascular: Denies chest pain. Respiratory: Denies shortness of breath. Gastrointestinal: No abdominal pain.  No nausea, no vomiting.  No diarrhea.  No constipation. Genitourinary: Negative for dysuria. Musculoskeletal: Negative for back pain. Skin: Negative for rash. Neurological: Negative for focal weakness or numbness. Positive HA.   ____________________________________________   PHYSICAL EXAM:  VITAL SIGNS: ED Triage Vitals  Encounter Vitals Group     BP 04/05/24 1044 (!) 171/70     Pulse Rate 04/05/24 1044 71     Resp 04/05/24 1044 12     Temp 04/05/24 1044 98.1 F (36.7 C)     Temp Source 04/05/24 1044 Oral     SpO2 04/05/24 1044 96 %   Constitutional:  Alert and oriented. Well appearing and in no acute distress. Eyes: Conjunctivae are normal. PERRL. Head: Atraumatic. Nose: No congestion/rhinnorhea. Mouth/Throat: Mucous membranes are moist.   Neck: No stridor.   Cardiovascular: Normal rate, regular rhythm. Good peripheral circulation. Grossly normal heart sounds.   Respiratory: Normal respiratory effort.  No retractions. Lungs CTAB. Gastrointestinal: Soft and nontender. No distention.  Musculoskeletal: No lower extremity tenderness nor edema. No gross deformities of extremities. Normal ROM of the bilateral upper and lower extremities.  Neurologic:  Normal speech and language. No gross focal neurologic deficits are appreciated. Mild intention tremor on exam.  Skin:  Skin is warm, dry and intact. No rash noted.  ____________________________________________   LABS (all labs ordered are listed, but only abnormal results are displayed)  Labs Reviewed  COMPREHENSIVE METABOLIC PANEL WITH GFR - Abnormal; Notable for the following components:      Result Value   Glucose, Bld 108 (*)    All other components within normal limits  CBC WITH DIFFERENTIAL/PLATELET - Abnormal; Notable for the following components:   Platelets 141 (*)    All other components within normal limits  URINALYSIS, W/ REFLEX TO CULTURE (INFECTION SUSPECTED) - Abnormal; Notable for the following components:   Ketones, ur 5 (*)    All other components within normal limits  TROPONIN I (HIGH SENSITIVITY)  TROPONIN I (HIGH SENSITIVITY)   ____________________________________________  EKG   EKG Interpretation Date/Time:  Wednesday Apr 05 2024 10:38:16 EDT Ventricular Rate:  75 PR Interval:  201 QRS Duration:  151 QT Interval:  396 QTC Calculation: 443 R Axis:   -14  Text Interpretation: Sinus arrhythmia Left bundle branch block Confirmed by Abby Hocking [id number]) on 04/05/2024 12:06:47 PM        ____________________________________________  RADIOLOGY  DG Chest  2 View Result Date: 04/05/2024 CLINICAL DATA:  Unwitnessed fall. EXAM: CHEST - 2 VIEW COMPARISON:  April 19, 2019. FINDINGS: The heart size and mediastinal contours are within normal limits. Both lungs are clear. The visualized skeletal structures are unremarkable. IMPRESSION: No active cardiopulmonary disease. Electronically Signed   By: Rosalene Colon M.D.   On: 04/05/2024 14:37   CT Head Wo Contrast Result Date: 04/05/2024 CLINICAL DATA:  Unwitnessed fall EXAM: CT HEAD WITHOUT CONTRAST CT CERVICAL SPINE WITHOUT CONTRAST TECHNIQUE: Multidetector CT imaging of the head and cervical spine was performed following the standard protocol without intravenous contrast. Multiplanar CT image reconstructions of the cervical spine were also generated. RADIATION DOSE REDUCTION: This exam was performed according to the departmental dose-optimization program which includes automated exposure control, adjustment of the mA and/or kV according to patient size and/or use of iterative reconstruction technique. COMPARISON:  05/31/2022 FINDINGS: CT HEAD FINDINGS Brain: No evidence of acute infarction, hemorrhage, hydrocephalus, extra-axial collection or mass lesion/mass effect. Mild periventricular white matter hypodensity. Vascular: No hyperdense vessel or unexpected calcification. Skull: Normal. Negative for fracture or focal lesion. Sinuses/Orbits: No acute finding. Other: Soft tissue contusion of the left parietal scalp (series 3, image 18). CT CERVICAL SPINE FINDINGS Alignment: Normal. Skull base and vertebrae: No acute fracture. No primary bone lesion or focal pathologic process. Soft tissues and spinal canal: No prevertebral fluid or swelling. No visible canal hematoma. Disc levels:  Mild multilevel cervical disc degenerative disease. Upper chest: Negative. Other: None. IMPRESSION: 1. No acute intracranial pathology. Mild small-vessel white matter disease. 2. Soft tissue contusion of the left parietal scalp. 3. No fracture or  static subluxation of the cervical spine. 4. Mild multilevel cervical disc degenerative disease. Electronically Signed   By: Fredricka Jenny M.D.   On: 04/05/2024 12:43   CT Cervical Spine Wo Contrast Result Date: 04/05/2024 CLINICAL DATA:  Unwitnessed fall EXAM: CT HEAD WITHOUT CONTRAST CT CERVICAL SPINE WITHOUT CONTRAST TECHNIQUE: Multidetector CT imaging of the head and cervical spine was performed following the standard protocol without intravenous contrast. Multiplanar CT image reconstructions of the cervical spine were also generated. RADIATION DOSE REDUCTION: This exam was performed according to the departmental dose-optimization program which includes automated exposure control, adjustment of the mA and/or kV according to patient size and/or use of iterative reconstruction technique. COMPARISON:  05/31/2022 FINDINGS: CT HEAD FINDINGS Brain: No evidence of acute infarction, hemorrhage, hydrocephalus, extra-axial collection or mass lesion/mass effect. Mild periventricular white matter hypodensity. Vascular: No hyperdense vessel or unexpected calcification. Skull: Normal. Negative for fracture or focal lesion. Sinuses/Orbits: No acute finding. Other: Soft tissue contusion of the left parietal scalp (series 3, image 18). CT CERVICAL SPINE FINDINGS Alignment: Normal. Skull base and vertebrae: No acute fracture. No primary bone lesion or focal pathologic process. Soft tissues and spinal canal: No prevertebral fluid or swelling. No visible canal hematoma. Disc levels:  Mild multilevel cervical disc degenerative disease. Upper chest: Negative. Other: None. IMPRESSION: 1. No acute intracranial pathology. Mild small-vessel white matter disease. 2. Soft tissue contusion of the left parietal scalp. 3. No fracture or static subluxation of the cervical spine. 4. Mild multilevel cervical disc degenerative  disease. Electronically Signed   By: Fredricka Jenny M.D.   On: 04/05/2024 12:43     ____________________________________________   PROCEDURES  Procedure(s) performed:   Procedures  None ____________________________________________   INITIAL IMPRESSION / ASSESSMENT AND PLAN / ED COURSE  Pertinent labs & imaging results that were available during my care of the patient were reviewed by me and considered in my medical decision making (see chart for details).   This patient is Presenting for Evaluation of fall, which does require a range of treatment options, and is a complaint that involves a high risk of morbidity and mortality.  The Differential Diagnoses includes subdural hematoma, epidural hematoma, acute concussion, traumatic subarachnoid hemorrhage, cerebral contusions, etc.   Critical Interventions-    Medications  sodium chloride  0.9 % bolus 500 mL (0 mLs Intravenous Stopped 04/05/24 1617)  acetaminophen  (TYLENOL ) tablet 1,000 mg (1,000 mg Oral Given 04/05/24 1142)  ketorolac  (TORADOL ) 15 MG/ML injection 15 mg (15 mg Intravenous Given 04/05/24 1430)    Reassessment after intervention: symptoms improved.    I did obtain Additional Historical Information from daughter at bedside.   Clinical Laboratory Tests Ordered, included UA without infection. CBC without anemia. Troponin negative. CMP without AKI.   Radiologic Tests Ordered, included CT head and c spine. I independently interpreted the images and agree with radiology interpretation.   Cardiac Monitor Tracing which shows NSR.    Social Determinants of Health Risk patient lives at home with wife.   Medical Decision Making: Summary:  Patient presents to the ED with head injury after fall vs syncope. Plan for CT imaging, labs with unknown circumstances surrounding the fall, and reassess.   Reevaluation with update and discussion with patient. Imaging and labs are reassuring. Considered admit but symptoms have resolved and workup is reassuring. Patient stable for discharge with close PCP follow  up.   Patient's presentation is most consistent with acute presentation with potential threat to life or bodily function.   Disposition: discharge  ____________________________________________  FINAL CLINICAL IMPRESSION(S) / ED DIAGNOSES  Final diagnoses:  Injury of head, initial encounter  Strain of neck muscle, initial encounter  Fall, initial encounter    Note:  This document was prepared using Dragon voice recognition software and may include unintentional dictation errors.  Abby Hocking, MD, [facility]    Arihanna Estabrook, Shereen Dike, MD 04/06/24 [phone]

## 2024-04-05 NOTE — ED Triage Notes (Signed)
 Pt BIB EMS from Home due to unwitnessed fall. No blood thinner. Pt does not remember falling, possible syncope episode. Pt hit back of head on door frame, no deformities noted pain in the general area. Pt c/o pain to the back of head; C-collar in place. Hx of vertigo, Parkinson Disease, and BPH. AAOx3.

## 2024-04-05 NOTE — Discharge Instructions (Signed)
 You were seen in the Emergency Department (ED) today for a head injury.  Based on your evaluation, you may have sustained a concussion. Your CT scan was done, it did not show any evidence of serious injury or bleeding.    Symptoms to expect from a concussion include nausea, mild to moderate headache, difficulty concentrating or sleeping, and mild lightheadedness.  These symptoms should improve over the next few days to weeks, but it may take many weeks before you feel back to normal.  Return to the emergency department or follow-up with your primary care doctor if your symptoms are not improving over this time.  Signs of a more serious head injury include vomiting, severe headache, excessive sleepiness or confusion, and weakness or numbness in your face, arms or legs.  Return immediately to the Emergency Department if you experience any of these more concerning symptoms.    Rest, avoid strenuous physical or mental activity, and avoid activities that could potentially result in another head injury until all your symptoms from this head injury are completely resolved for at least 2-3 weeks.  You may take acetaminophen  over the counter according to label instructions for mild headache or scalp soreness.

## 2024-07-05 ENCOUNTER — Other Ambulatory Visit: Payer: Self-pay

## 2024-07-05 ENCOUNTER — Emergency Department (HOSPITAL_COMMUNITY)
Admission: EM | Admit: 2024-07-05 | Discharge: 2024-07-05 | Disposition: A | Discharge status: Home / Self Care | Attending: Emergency Medicine | Admitting: Emergency Medicine

## 2024-07-05 ENCOUNTER — Encounter (HOSPITAL_COMMUNITY): Payer: Self-pay

## 2024-07-05 DIAGNOSIS — R339 Retention of urine, unspecified: Secondary | ICD-10-CM

## 2024-07-05 DIAGNOSIS — D72829 Elevated white blood cell count, unspecified: Secondary | ICD-10-CM | POA: Insufficient documentation

## 2024-07-05 DIAGNOSIS — N390 Urinary tract infection, site not specified: Secondary | ICD-10-CM | POA: Diagnosis not present

## 2024-07-05 LAB — BASIC METABOLIC PANEL WITH GFR
Anion gap: 11 (ref 5–15)
BUN: 14 mg/dL (ref 8–23)
CO2: 25 mmol/L (ref 22–32)
Calcium: 9.1 mg/dL (ref 8.9–10.3)
Chloride: 97 mmol/L — ABNORMAL LOW (ref 98–111)
Creatinine, Ser: 0.9 mg/dL (ref 0.61–1.24)
GFR, Estimated: >60 mL/min (ref >60)
Glucose, Bld: 127 mg/dL — ABNORMAL HIGH (ref 70–99)
Potassium: 3.9 mmol/L (ref 3.5–5.1)
Sodium: 133 mmol/L — ABNORMAL LOW (ref 135–145)

## 2024-07-05 LAB — URINALYSIS, ROUTINE W REFLEX MICROSCOPIC
Bilirubin Urine: NEGATIVE
Glucose, UA: NEGATIVE mg/dL
Ketones, ur: NEGATIVE mg/dL
Nitrite: POSITIVE — AB
Protein, ur: 30 mg/dL — AB
Specific Gravity, Urine: 1.011 (ref 1.005–1.030)
pH: 5 (ref 5.0–8.0)

## 2024-07-05 LAB — CBC
HCT: 41.2 % (ref 39.0–52.0)
Hemoglobin: 13.3 g/dL (ref 13.0–17.0)
MCH: 30.2 pg (ref 26.0–34.0)
MCHC: 32.3 g/dL (ref 30.0–36.0)
MCV: 93.6 fL (ref 80.0–100.0)
Platelets: 114 K/uL — ABNORMAL LOW (ref 150–400)
RBC: 4.4 MIL/uL (ref 4.22–5.81)
RDW: 12.1 % (ref 11.5–15.5)
WBC: 11.3 K/uL — ABNORMAL HIGH (ref 4.0–10.5)
nRBC: 0 % (ref 0.0–0.2)

## 2024-07-05 MED ORDER — SODIUM CHLORIDE 0.9 % IV BOLUS
1000.0000 mL | Freq: Once | INTRAVENOUS | Status: AC
  Administered 2024-07-05: 1000 mL via INTRAVENOUS

## 2024-07-05 MED ORDER — CIPROFLOXACIN HCL 500 MG PO TABS
500.0000 mg | ORAL_TABLET | Freq: Once | ORAL | Status: AC
  Administered 2024-07-05: 500 mg via ORAL
  Filled 2024-07-05: qty 1

## 2024-07-05 MED ORDER — FOSFOMYCIN TROMETHAMINE 3 G PO PACK
3.0000 g | PACK | Freq: Once | ORAL | Status: AC
  Administered 2024-07-05: 3 g via ORAL
  Filled 2024-07-05: qty 3

## 2024-07-05 MED ORDER — LIDOCAINE HCL URETHRAL/MUCOSAL 2 % EX GEL
1.0000 | Freq: Once | CUTANEOUS | Status: DC | PRN
  Filled 2024-07-05: qty 11

## 2024-07-05 NOTE — ED Triage Notes (Addendum)
 Pt BIBA from home, c/o urinary retention and abd pain starting this morning. Went to the doctor yesterday and was told he had a UTI and was given cipro . Taken a dose yesterday and this morning. Has an history of prostate problems and bladder cancer.  VSS. A&Ox4

## 2024-07-05 NOTE — Discharge Instructions (Signed)
 Please continue taking the ciprofloxacin  tomorrow morning at home and complete the full course of antibiotic.  Follow-up with urologist by calling to schedule an appointment in the next 2 weeks to have the catheter potentially removed and a void trial.  If Jeremiah Zimmerman begins having high fevers, confusion, worsening abdominal pain, please return to the ER.

## 2024-07-05 NOTE — ED Notes (Signed)
 Pt urine was an orange color but reports taking AZO

## 2024-07-05 NOTE — ED Provider Notes (Signed)
 [place] EMERGENCY DEPARTMENT AT [facility] Provider Note   CSN: [id number] Arrival date & time: 07/05/24  1725     Patient presents with: Urinary Retention and Abdominal Pain   Jeremiah Zimmerman is a 89 y.o. male presented to ED with 3 days of fever and complaint of urinary retention.  Patient reports for the past 24 hours she has had a hard time urinating.  His daughter at the bedside reports he went to see urologist yesterday and was diagnosed with a bad UTI based on a urine sample and started on ciprofloxacin .  He has taken 2 doses so far.  Exam he was have difficulty with urinating this morning, although he did have a small trickle at 10 AM.  He said he has not had a bowel movement in 2 days as well.  He says his abdomen is hurting him.  He does have a history of prostate cancer and bladder cancer   HPI     Prior to Admission medications   Medication Sig Start Date End Date Taking? Authorizing Provider  AMBULATORY NON FORMULARY MEDICATION Lift Chair Dx:  G20 04/30/21   TatAsberry RAMAN, DO  AMBULATORY NON FORMULARY MEDICATION Rollator Dx: G20 04/30/21   TatAsberry RAMAN, DO  Carbidopa -Levodopa  ER (SINEMET  CR) 25-100 MG tablet controlled release Take 2 tablets by mouth 3 (three) times daily. 8am/noon/4pm 02/10/24   Tat, Asberry RAMAN, DO  docusate sodium  (COLACE) 100 MG capsule Take 100 mg by mouth 3 (three) times daily.    [provider]  Multiple Vitamin (MULTIVITAMIN) capsule Take 1 capsule by mouth daily.    [provider]  polyethylene glycol (MIRALAX ) packet Take 17 g by mouth daily as needed for mild constipation. Patient taking differently: Take 17 g by mouth daily. 07/21/18   Vicci Burnard SAUNDERS, PA-C  Tamsulosin  HCl (FLOMAX ) 0.4 MG CAPS Take 0.4 mg by mouth daily.     [provider]    Allergies: Erythromycin, Nitroglycerin, Sulfa antibiotics, Tetracyclines & related, Amoxicillin, and Penicillins    Review of Systems  Updated Vital  Signs BP 129/78   Pulse 88   Temp 98.8 F (37.1 C) (Oral)   Resp 20   Ht 5' 10 (1.778 m)   Wt 61.2 kg   SpO2 95%   BMI 19.37 kg/m   Physical Exam Constitutional:      General: He is not in acute distress. HENT:     Head: Normocephalic and atraumatic.  Eyes:     Conjunctiva/sclera: Conjunctivae normal.     Pupils: Pupils are equal, round, and reactive to light.  Cardiovascular:     Rate and Rhythm: Normal rate and regular rhythm.  Pulmonary:     Effort: Pulmonary effort is normal. No respiratory distress.  Abdominal:     General: There is no distension.     Tenderness: There is abdominal tenderness in the suprapubic area. There is guarding. There is no rebound. Negative signs include Murphy's sign.  Skin:    General: Skin is warm and dry.  Neurological:     General: No focal deficit present.     Mental Status: He is alert. Mental status is at baseline.  Psychiatric:        Mood and Affect: Mood normal.        Behavior: Behavior normal.     (all labs ordered are listed, but only abnormal results are displayed) Labs Reviewed  URINALYSIS, ROUTINE W REFLEX MICROSCOPIC - Abnormal; Notable for the following  components:      Result Value   Color, Urine ORANGE (*)    Hgb urine dipstick MODERATE (*)    Protein, ur 30 (*)    Nitrite POSITIVE (*)    Leukocytes,Ua MODERATE (*)    Bacteria, UA RARE (*)    All other components within normal limits  BASIC METABOLIC PANEL WITH GFR - Abnormal; Notable for the following components:   Sodium 133 (*)    Chloride 97 (*)    Glucose, Bld 127 (*)    All other components within normal limits  CBC - Abnormal; Notable for the following components:   WBC 11.3 (*)    Platelets 114 (*)    All other components within normal limits  URINE CULTURE    EKG: None  Radiology: No results found.   Procedures   Medications Ordered in the ED  lidocaine  (XYLOCAINE ) 2 % jelly 1 Application (has no administration in time range)  sodium  chloride 0.9 % bolus 1,000 mL (0 mLs Intravenous Stopped 07/05/24 2127)  ciprofloxacin  (CIPRO ) tablet 500 mg (500 mg Oral Given 07/05/24 1944)  fosfomycin (MONUROL ) packet 3 g (3 g Oral Given 07/05/24 2109)    Clinical Course as of 07/05/24 2343  Wed Jul 05, 2024  1822 RN reports 500 cc + on bladder scan. I advised foley placement [MT]  1931 Pt had complete relief of discomfort with foley placement.  Urine orange tinged (he took azo this morning).  Given that his symptoms are likely urinary in nature, therefore, I no longer feel that we need an emergent CT scan at this time. [MT]    Clinical Course User Index [MT] Ender Rorke, Donnice PARAS, MD                                 Medical Decision Making Amount and/or Complexity of Data Reviewed Labs: ordered.  Risk Prescription drug management.   This patient presents to the ED with concern for lower abdominal pain, urinary retention. This involves an extensive number of treatment options, and is a complaint that carries with it a high risk of complications and morbidity.  The differential diagnosis includes cystitis vs BPH vs urinary retention vs constipation vs colitis vs other intraabdominal process  Co-morbidities that complicate the patient evaluation: hx of bladder cancer, BPH  Additional history obtained from family at bedside  I ordered and personally interpreted labs.  The pertinent results include: UA consistent with infection.  White blood cell count 11.3   I ordered medication including ciprofloxacin  for home antibiotic and fosfomycin for her single dose double coverage of UTI.  It is not clear whether the patient would have truly failed ciprofloxacin  as an outpatient or whether he has not had he had enough doses to take her Effexor, so we will continue this medication.  I have reviewed the patients home medicines and have made adjustments as needed  Test Considered: CT imaging of the abdomen was initially considered, but after  complete resolution of the patient's symptoms with Foley placement, I no longer felt that there was a secondary process beyond cystitis causing abdominal pain.  After the interventions noted above, I reevaluated the patient and found that they have: improved    Disposition:  After consideration of the diagnostic results and the patients response to treatment, I feel that the patent would benefit from outpatient follow-up with urology.      Final diagnoses:  Urinary tract  infection with hematuria, site unspecified  Urinary retention    ED Discharge Orders     None          Kalyiah Saintil, Donnice PARAS, MD 07/05/24 [phone]

## 2024-07-07 ENCOUNTER — Encounter (HOSPITAL_COMMUNITY): Payer: Self-pay

## 2024-07-07 ENCOUNTER — Emergency Department (HOSPITAL_COMMUNITY)

## 2024-07-07 ENCOUNTER — Other Ambulatory Visit: Payer: Self-pay

## 2024-07-07 ENCOUNTER — Observation Stay (HOSPITAL_COMMUNITY)
Admission: EM | Admit: 2024-07-07 | Discharge: 2024-07-12 | Disposition: A | Discharge status: Home / Self Care | Attending: Internal Medicine | Admitting: Internal Medicine

## 2024-07-07 DIAGNOSIS — R531 Weakness: Secondary | ICD-10-CM | POA: Diagnosis not present

## 2024-07-07 DIAGNOSIS — N390 Urinary tract infection, site not specified: Principal | ICD-10-CM | POA: Diagnosis present

## 2024-07-07 DIAGNOSIS — N3001 Acute cystitis with hematuria: Secondary | ICD-10-CM

## 2024-07-07 DIAGNOSIS — Z515 Encounter for palliative care: Secondary | ICD-10-CM

## 2024-07-07 DIAGNOSIS — R339 Retention of urine, unspecified: Secondary | ICD-10-CM | POA: Diagnosis not present

## 2024-07-07 DIAGNOSIS — M6281 Muscle weakness (generalized): Secondary | ICD-10-CM | POA: Insufficient documentation

## 2024-07-07 DIAGNOSIS — N472 Paraphimosis: Secondary | ICD-10-CM | POA: Diagnosis not present

## 2024-07-07 DIAGNOSIS — Z7189 Other specified counseling: Secondary | ICD-10-CM | POA: Insufficient documentation

## 2024-07-07 DIAGNOSIS — R338 Other retention of urine: Secondary | ICD-10-CM

## 2024-07-07 DIAGNOSIS — G20C Parkinsonism, unspecified: Secondary | ICD-10-CM | POA: Insufficient documentation

## 2024-07-07 DIAGNOSIS — N401 Enlarged prostate with lower urinary tract symptoms: Secondary | ICD-10-CM

## 2024-07-07 DIAGNOSIS — R509 Fever, unspecified: Secondary | ICD-10-CM | POA: Diagnosis present

## 2024-07-07 DIAGNOSIS — R7881 Bacteremia: Secondary | ICD-10-CM | POA: Insufficient documentation

## 2024-07-07 LAB — URINALYSIS, ROUTINE W REFLEX MICROSCOPIC
Bilirubin Urine: NEGATIVE
Glucose, UA: NEGATIVE mg/dL
Ketones, ur: NEGATIVE mg/dL
Nitrite: NEGATIVE
Protein, ur: 30 mg/dL — AB
Specific Gravity, Urine: 1.017 (ref 1.005–1.030)
WBC, UA: >50 WBC/hpf (ref 0–5)
pH: 5 (ref 5.0–8.0)

## 2024-07-07 LAB — LIPASE, BLOOD: Lipase: 21 U/L (ref 11–51)

## 2024-07-07 LAB — CBC WITH DIFFERENTIAL/PLATELET
Abs Immature Granulocytes: 0.13 K/uL — ABNORMAL HIGH (ref 0.00–0.07)
Basophils Absolute: 0 K/uL (ref 0.0–0.1)
Basophils Relative: 0 %
Eosinophils Absolute: 0 K/uL (ref 0.0–0.5)
Eosinophils Relative: 0 %
HCT: 37.7 % — ABNORMAL LOW (ref 39.0–52.0)
Hemoglobin: 11.8 g/dL — ABNORMAL LOW (ref 13.0–17.0)
Immature Granulocytes: 2 %
Lymphocytes Relative: 11 %
Lymphs Abs: 0.8 K/uL (ref 0.7–4.0)
MCH: 29.5 pg (ref 26.0–34.0)
MCHC: 31.3 g/dL (ref 30.0–36.0)
MCV: 94.3 fL (ref 80.0–100.0)
Monocytes Absolute: 1.4 K/uL — ABNORMAL HIGH (ref 0.1–1.0)
Monocytes Relative: 18 %
Neutro Abs: 5.2 K/uL (ref 1.7–7.7)
Neutrophils Relative %: 69 %
Platelets: 119 K/uL — ABNORMAL LOW (ref 150–400)
RBC: 4 MIL/uL — ABNORMAL LOW (ref 4.22–5.81)
RDW: 12.3 % (ref 11.5–15.5)
WBC: 7.6 K/uL (ref 4.0–10.5)
nRBC: 0 % (ref 0.0–0.2)

## 2024-07-07 LAB — COMPREHENSIVE METABOLIC PANEL WITH GFR
ALT: 12 U/L (ref 0–44)
AST: 39 U/L (ref 15–41)
Albumin: 2.4 g/dL — ABNORMAL LOW (ref 3.5–5.0)
Alkaline Phosphatase: 112 U/L (ref 38–126)
Anion gap: 9 (ref 5–15)
BUN: 13 mg/dL (ref 8–23)
CO2: 24 mmol/L (ref 22–32)
Calcium: 8.4 mg/dL — ABNORMAL LOW (ref 8.9–10.3)
Chloride: 102 mmol/L (ref 98–111)
Creatinine, Ser: 0.76 mg/dL (ref 0.61–1.24)
GFR, Estimated: >60 mL/min (ref >60)
Glucose, Bld: 110 mg/dL — ABNORMAL HIGH (ref 70–99)
Potassium: 3.5 mmol/L (ref 3.5–5.1)
Sodium: 135 mmol/L (ref 135–145)
Total Bilirubin: 0.8 mg/dL (ref 0.0–1.2)
Total Protein: 5.8 g/dL — ABNORMAL LOW (ref 6.5–8.1)

## 2024-07-07 LAB — RESP PANEL BY RT-PCR (RSV, FLU A&B, COVID)  RVPGX2
Influenza A by PCR: NEGATIVE
Influenza B by PCR: NEGATIVE
Resp Syncytial Virus by PCR: NEGATIVE
SARS Coronavirus 2 by RT PCR: NEGATIVE

## 2024-07-07 LAB — URINE CULTURE: Culture: >=10000 — AB

## 2024-07-07 LAB — I-STAT CG4 LACTIC ACID, ED: Lactic Acid, Venous: 1.6 mmol/L (ref 0.5–1.9)

## 2024-07-07 MED ORDER — SODIUM CHLORIDE 0.9 % IV SOLN
1.0000 g | Freq: Once | INTRAVENOUS | Status: AC
  Administered 2024-07-07: 1 g via INTRAVENOUS
  Filled 2024-07-07: qty 10

## 2024-07-07 MED ORDER — CARBIDOPA-LEVODOPA ER 25-100 MG PO TBCR
2.0000 | EXTENDED_RELEASE_TABLET | Freq: Three times a day (TID) | ORAL | Status: DC
  Administered 2024-07-07 – 2024-07-12 (×14): 2 via ORAL
  Filled 2024-07-07 (×16): qty 2

## 2024-07-07 MED ORDER — ACETAMINOPHEN 650 MG RE SUPP
650.0000 mg | Freq: Four times a day (QID) | RECTAL | Status: DC | PRN
Start: 2024-07-07 — End: 2024-07-08

## 2024-07-07 MED ORDER — BISACODYL 5 MG PO TBEC: 5.0000 mg | DELAYED_RELEASE_TABLET | Freq: Every day | ORAL | Status: DC | PRN

## 2024-07-07 MED ORDER — ACETAMINOPHEN 325 MG PO TABS
650.0000 mg | ORAL_TABLET | Freq: Four times a day (QID) | ORAL | Status: DC | PRN
  Administered 2024-07-07 – 2024-07-08 (×3): 650 mg via ORAL
  Filled 2024-07-07 (×3): qty 2

## 2024-07-07 MED ORDER — TAMSULOSIN HCL 0.4 MG PO CAPS
0.4000 mg | ORAL_CAPSULE | Freq: Every day | ORAL | Status: DC
  Administered 2024-07-08 – 2024-07-12 (×5): 0.4 mg via ORAL
  Filled 2024-07-07 (×5): qty 1

## 2024-07-07 MED ORDER — SODIUM CHLORIDE 0.9 % IV BOLUS
1000.0000 mL | Freq: Once | INTRAVENOUS | Status: AC
  Administered 2024-07-07: 1000 mL via INTRAVENOUS

## 2024-07-07 MED ORDER — IOHEXOL 300 MG/ML  SOLN
100.0000 mL | Freq: Once | INTRAMUSCULAR | Status: AC | PRN
  Administered 2024-07-07: 100 mL via INTRAVENOUS

## 2024-07-07 MED ORDER — SENNOSIDES-DOCUSATE SODIUM 8.6-50 MG PO TABS: 1.0000 | ORAL_TABLET | Freq: Every evening | ORAL | Status: DC | PRN

## 2024-07-07 MED ORDER — ENOXAPARIN SODIUM 40 MG/0.4ML IJ SOSY
40.0000 mg | PREFILLED_SYRINGE | Freq: Every day | INTRAMUSCULAR | Status: DC
  Administered 2024-07-07 – 2024-07-11 (×5): 40 mg via SUBCUTANEOUS
  Filled 2024-07-07 (×5): qty 0.4

## 2024-07-07 MED ORDER — ONDANSETRON HCL 4 MG PO TABS: 4.0000 mg | ORAL_TABLET | Freq: Four times a day (QID) | ORAL | Status: DC | PRN

## 2024-07-07 MED ORDER — ONDANSETRON HCL 4 MG/2ML IJ SOLN
4.0000 mg | Freq: Four times a day (QID) | INTRAMUSCULAR | Status: DC | PRN
  Filled 2024-07-07: qty 2

## 2024-07-07 NOTE — ED Triage Notes (Signed)
 Pt BIB [facility] from home C/O fever since 8/17. Saw PCP 8/19, dx with UTI and prescribed Cipro . 8/20 seen in ED for urinary retention and continued fever. Continues to have high fevers (Tmax 104.8) at home, PCP recommended to come in for reevaluation. Per EMS, temp 100.7, gave 650 mg Tylenol .   150/80 84  95% RA CBG 111

## 2024-07-07 NOTE — ED Provider Notes (Signed)
 [place] EMERGENCY DEPARTMENT AT [facility] Provider Note  CSN: [id number] Arrival date & time: 07/07/24 1526  Chief Complaint(s) Fever  HPI Jeremiah Zimmerman is a 89 y.o. male history of Parkinson's disease, small bowel obstruction, prior bladder cancer, hypertension, hyperlipidemia presenting with fever.  Patient has been having fevers for around 5 or 6 days.  Recently was started on ciprofloxacin  by primary doctor for possible UTI.  Came to the emergency department couple days ago was found to have some urinary retention and Foley catheter was placed, ultimately was discharged.  Has been compliant with medication at home but has persistent fevers and fatigue, weakness.  Has some possible mild nausea, no vomiting.  No diarrhea.  No chest pain or shortness of breath.  No cough or runny nose, sore throat.  Denies any current headache.  Family called paramedics to bring him to the hospital.  EMS did find patient was febrile to 100.7.   Past Medical History Past Medical History:  Diagnosis Date   Adenomatous colon polyp    Bladder cancer (HCC) 2010   BPH (benign prostatic hyperplasia)    Heart murmur    Hyperlipidemia    Hypertension    MVP (mitral valve prolapse) 01-13-13   hx. LBBB previous EKG 11'11  in Epic.   Parkinson's disease (HCC)    SBO (small bowel obstruction) (HCC) 07/18/2018   Seasonal allergies 01-13-13   Patient Active Problem List   Diagnosis Date Noted   Acute lower UTI 04/19/2019   UTI (urinary tract infection) 04/19/2019   Sepsis (HCC) 04/18/2019   Sepsis due to gram-negative UTI (HCC) 04/17/2019   Parkinson's disease (HCC) 04/17/2019   Small bowel obstruction (HCC) 07/18/2018   Right upper quadrant abdominal pain 01/25/2017   Elevated LFTs 01/25/2017   Acute medial meniscal tear 01/19/2013   MUSCLE PAIN 10/15/2010   OTHER MALAISE AND FATIGUE 10/15/2010   DYSPNEA 10/15/2010   Malignant neoplasm of bladder (HCC) 10/14/2010   Dyslipidemia 10/14/2010    Essential hypertension 10/14/2010   BUNDLE BRANCH BLOCK, LEFT 10/14/2010   GROSS HEMATURIA 10/14/2010   Home Medication(s) Prior to Admission medications   Medication Sig Start Date End Date Taking? Authorizing Provider  AMBULATORY NON FORMULARY MEDICATION Lift Chair Dx:  G20 04/30/21   TatAsberry RAMAN, DO  AMBULATORY NON FORMULARY MEDICATION Rollator Dx: G20 04/30/21   TatAsberry RAMAN, DO  Carbidopa -Levodopa  ER (SINEMET  CR) 25-100 MG tablet controlled release Take 2 tablets by mouth 3 (three) times daily. 8am/noon/4pm 02/10/24   Tat, Asberry RAMAN, DO  docusate sodium  (COLACE) 100 MG capsule Take 100 mg by mouth 3 (three) times daily.    [provider]  Multiple Vitamin (MULTIVITAMIN) capsule Take 1 capsule by mouth daily.    [provider]  polyethylene glycol (MIRALAX ) packet Take 17 g by mouth daily as needed for mild constipation. Patient taking differently: Take 17 g by mouth daily. 07/21/18   Vicci Burnard SAUNDERS, PA-C  Tamsulosin  HCl (FLOMAX ) 0.4 MG CAPS Take 0.4 mg by mouth daily.     [provider]  Past Surgical History Past Surgical History:  Procedure Laterality Date   APPENDECTOMY     BLADDER SURGERY  2010   bladder cancer in 2010/Post surgery bleed   CARDIAC CATHETERIZATION  01-13-13   greater than 5 yrs ago-negative findings   CHOLECYSTECTOMY N/A 01/27/2017   Procedure: LAPAROSCOPIC CHOLECYSTECTOMY WITH INTRAOPERATIVE CHOLANGIOGRAM;  Surgeon: Donnice Bury, MD;  Location: [facility] OR;  Service: General;  Laterality: N/A;   COLONOSCOPY  01-13-13   hx.colon polyps excised-last 3-4 yrs ago   ELBOW SURGERY     left   KNEE ARTHROSCOPY WITH MEDIAL MENISECTOMY Right 01/20/2013   Procedure: KNEE ARTHROSCOPY WITH MEDIAL MENISECTOMY;  Surgeon: Dempsey LULLA Moan, MD;  Location: [facility] ORS;  Service: Orthopedics;  Laterality: Right;  right knee  arthroscopy with medial meniscal debridement and chrondroplasty   TONSILLECTOMY     TONSILLECTOMY AND ADENOIDECTOMY     Family History Family History  Problem Relation Age of Onset   Breast cancer Mother    Prostate cancer Father    Colon cancer Brother    Cervical cancer Daughter    Thyroid cancer Daughter    Uterine cancer Daughter    Prostate cancer Brother    Alzheimer's disease Sister    Alzheimer's disease Sister    Healthy Son     Social History Social History   Tobacco Use   Smoking status: Never   Smokeless tobacco: Never  Vaping Use   Vaping status: Never Used  Substance Use Topics   Alcohol use: No   Drug use: No   Allergies Erythromycin, Nitroglycerin, Sulfa antibiotics, Tetracyclines & related, Amoxicillin, and Penicillins  Review of Systems Review of Systems  All other systems reviewed and are negative.   Physical Exam Vital Signs  I have reviewed the triage vital signs BP (!) 138/59 (BP Location: Left Arm)   Pulse 82   Temp 98.3 F (36.8 C) (Oral)   Resp 20   Ht 5' 10 (1.778 m)   Wt 61.2 kg   SpO2 94%   BMI 19.37 kg/m  Physical Exam Vitals and nursing note reviewed.  Constitutional:      General: He is not in acute distress.    Appearance: Normal appearance.  HENT:     Mouth/Throat:     Mouth: Mucous membranes are moist.  Eyes:     Conjunctiva/sclera: Conjunctivae normal.  Cardiovascular:     Rate and Rhythm: Normal rate and regular rhythm.  Pulmonary:     Effort: Pulmonary effort is normal. No respiratory distress.     Breath sounds: Normal breath sounds.  Abdominal:     General: Abdomen is flat.     Palpations: Abdomen is soft.     Tenderness: There is abdominal tenderness (mild diffuse).  Musculoskeletal:     Right lower leg: No edema.     Left lower leg: No edema.  Skin:    General: Skin is warm and dry.     Capillary Refill: Capillary refill takes less than 2 seconds.  Neurological:     Mental Status: He is alert and  oriented to person, place, and time. Mental status is at baseline.  Psychiatric:        Mood and Affect: Mood normal.        Behavior: Behavior normal.     ED Results and Treatments Labs (all labs ordered are listed, but only abnormal results are displayed) Labs Reviewed  COMPREHENSIVE METABOLIC PANEL WITH GFR - Abnormal; Notable for the following components:  Result Value   Glucose, Bld 110 (*)    Calcium 8.4 (*)    Total Protein 5.8 (*)    Albumin 2.4 (*)    All other components within normal limits  CBC WITH DIFFERENTIAL/PLATELET - Abnormal; Notable for the following components:   RBC 4.00 (*)    Hemoglobin 11.8 (*)    HCT 37.7 (*)    Platelets 119 (*)    Monocytes Absolute 1.4 (*)    Abs Immature Granulocytes 0.13 (*)    All other components within normal limits  URINALYSIS, ROUTINE W REFLEX MICROSCOPIC - Abnormal; Notable for the following components:   APPearance HAZY (*)    Hgb urine dipstick LARGE (*)    Protein, ur 30 (*)    Leukocytes,Ua LARGE (*)    Bacteria, UA RARE (*)    All other components within normal limits  CULTURE, BLOOD (ROUTINE X 2)  CULTURE, BLOOD (ROUTINE X 2)  RESP PANEL BY RT-PCR (RSV, FLU A&B, COVID)  RVPGX2  URINE CULTURE  LIPASE, BLOOD  I-STAT CG4 LACTIC ACID, ED  I-STAT CG4 LACTIC ACID, ED                                                                                                                          Radiology CT ABDOMEN PELVIS W CONTRAST Result Date: 07/07/2024 CLINICAL DATA:  Acute abdominal pain.  Fever. EXAM: CT ABDOMEN AND PELVIS WITH CONTRAST TECHNIQUE: Multidetector CT imaging of the abdomen and pelvis was performed using the standard protocol following bolus administration of intravenous contrast. RADIATION DOSE REDUCTION: This exam was performed according to the departmental dose-optimization program which includes automated exposure control, adjustment of the mA and/or kV according to patient size and/or use of iterative  reconstruction technique. CONTRAST:  OMNIPAQUE  IOHEXOL  300 MG/ML  SOLN COMPARISON:  04/20/2019 FINDINGS: Lower Chest: No acute findings. Hepatobiliary: No suspicious hepatic masses identified. Prior cholecystectomy. No evidence of biliary obstruction. Pancreas:  No mass or inflammatory changes. Spleen: Within normal limits in size and appearance. Adrenals/Urinary Tract: No suspicious masses identified. Bilateral benign-appearing renal cysts again noted (No followup imaging is recommended). No evidence of ureteral calculi or hydronephrosis. Foley catheter seen in the bladder. Diffuse bladder wall thickening is seen, presumably due to chronic bladder outlet obstruction given enlarged prostate. Stomach/Bowel: No evidence of obstruction, inflammatory process or abnormal fluid collections. Vascular/Lymphatic: No pathologically enlarged lymph nodes. No acute vascular findings. Reproductive:  Stable markedly enlarged prostate. Other:  None. Musculoskeletal:  No suspicious bone lesions identified. IMPRESSION: No acute findings. Stable markedly enlarged prostate, and findings of chronic bladder outlet obstruction. Foley catheter within bladder. Electronically Signed   By: Norleen DELENA Kil M.D.   On: 07/07/2024 18:46   DG Chest Portable 1 View Result Date: 07/07/2024 CLINICAL DATA:  weakness EXAM: PORTABLE CHEST - 1 VIEW COMPARISON:  Apr 05, 2024 FINDINGS: No focal airspace consolidation, pleural effusion, or pneumothorax. No cardiomegaly. Aortic atherosclerosis. No acute fracture or destructive lesions. Multilevel thoracic osteophytosis.  IMPRESSION: No acute cardiopulmonary abnormality. Electronically Signed   By: Rogelia Myers M.D.   On: 07/07/2024 17:12    Pertinent labs & imaging results that were available during my care of the patient were reviewed by me and considered in my medical decision making (see MDM for details).  Medications Ordered in ED Medications  cefTRIAXone  (ROCEPHIN ) 1 g in sodium chloride   0.9 % 100 mL IVPB (has no administration in time range)  sodium chloride  0.9 % bolus 1,000 mL (1,000 mLs Intravenous New Bag/Given 07/07/24 1649)  iohexol  (OMNIPAQUE ) 300 MG/ML solution 100 mL (100 mLs Intravenous Contrast Given 07/07/24 1755)                                                                                                                                     Procedures Procedures  (including critical care time)  Medical Decision Making / ED Course   MDM:  89 year old presenting with persistent fevers.  Patient overall well-appearing, vital signs reassuring, afebrile in the ER but did have fever with paramedics.  Rectal temp checked and normal.  Patient has been on antibiotics for around 5 days now, however his symptoms have been persistent.  Suspect likely due to UTI.  Urine culture obtained few days ago was without growth however patient had been on antibiotics.  Patient did have some vague abdominal tenderness so CT scan was obtained without evidence of other acute process.  Also obtain chest x-ray without evidence of pneumonia.  Flu and COVID swab was obtained and negative.  No sign of skin or soft tissue infection.  Extremely low concern for other process such as CNS infection.  The patient is stable for admission.  Discussed with Dr. Lou was agreed to admit the patient for further management of fever and likely UTI.      Additional history obtained: -Additional history obtained from family and ems -External records from outside source obtained and reviewed including: Chart review including previous notes, labs, imaging, consultation notes including previous ER note    Lab Tests: -I ordered, reviewed, and interpreted labs.   The pertinent results include:   Labs Reviewed  COMPREHENSIVE METABOLIC PANEL WITH GFR - Abnormal; Notable for the following components:      Result Value   Glucose, Bld 110 (*)    Calcium 8.4 (*)    Total Protein 5.8 (*)    Albumin 2.4  (*)    All other components within normal limits  CBC WITH DIFFERENTIAL/PLATELET - Abnormal; Notable for the following components:   RBC 4.00 (*)    Hemoglobin 11.8 (*)    HCT 37.7 (*)    Platelets 119 (*)    Monocytes Absolute 1.4 (*)    Abs Immature Granulocytes 0.13 (*)    All other components within normal limits  URINALYSIS, ROUTINE W REFLEX MICROSCOPIC - Abnormal; Notable for the following components:   APPearance HAZY (*)    Hgb  urine dipstick LARGE (*)    Protein, ur 30 (*)    Leukocytes,Ua LARGE (*)    Bacteria, UA RARE (*)    All other components within normal limits  CULTURE, BLOOD (ROUTINE X 2)  CULTURE, BLOOD (ROUTINE X 2)  RESP PANEL BY RT-PCR (RSV, FLU A&B, COVID)  RVPGX2  URINE CULTURE  LIPASE, BLOOD  I-STAT CG4 LACTIC ACID, ED  I-STAT CG4 LACTIC ACID, ED    Notable for no leukocytosis, normal lactic acid    Imaging Studies ordered: I ordered imaging studies including CXR, CT abdomen On my interpretation imaging demonstrates no acute process I independently visualized and interpreted imaging. I agree with the radiologist interpretation   Medicines ordered and prescription drug management: Meds ordered this encounter  Medications   sodium chloride  0.9 % bolus 1,000 mL   iohexol  (OMNIPAQUE ) 300 MG/ML solution 100 mL   cefTRIAXone  (ROCEPHIN ) 1 g in sodium chloride  0.9 % 100 mL IVPB    Antibiotic Indication::   UTI    -I have reviewed the patients home medicines and have made adjustments as needed   Consultations Obtained: I requested consultation with the hospitalist,  and discussed lab and imaging findings as well as pertinent plan - they recommend: admission   Cardiac Monitoring: The patient was maintained on a cardiac monitor.  I personally viewed and interpreted the cardiac monitored which showed an underlying rhythm of: NSR  Reevaluation: After the interventions noted above, I reevaluated the patient and found that their symptoms have  improved  Co morbidities that complicate the patient evaluation  Past Medical History:  Diagnosis Date   Adenomatous colon polyp    Bladder cancer (HCC) 2010   BPH (benign prostatic hyperplasia)    Heart murmur    Hyperlipidemia    Hypertension    MVP (mitral valve prolapse) 01-13-13   hx. LBBB previous EKG 11'11  in Epic.   Parkinson's disease (HCC)    SBO (small bowel obstruction) (HCC) 07/18/2018   Seasonal allergies 01-13-13      Dispostion: Disposition decision including need for hospitalization was considered, and patient admitted to the hospital.    Final Clinical Impression(s) / ED Diagnoses Final diagnoses:  Urinary tract infection without hematuria, site unspecified     This chart was dictated using voice recognition software.  Despite best efforts to proofread,  errors can occur which can change the documentation meaning.    Francesca Elsie CROME, MD 07/07/24 [phone]

## 2024-07-07 NOTE — H&P (Signed)
 History and Physical  Jeremiah Zimmerman FMW:[id number] DOB: [birth year] DOA: 07/07/2024  PCP: Onita Rush, MD   Chief Complaint: Fever, generalized weakness  HPI: Jeremiah Zimmerman is a 89 y.o. male with medical history significant for Parkinson's disease, bladder cancer, BPH, HTN, HLD and recent urinary retention s/p Foley placement on 8/20 now presenting with persistent fevers.  Per daughter, patient started having fever last Sunday up to 102.3.  He was evaluated by PCP on Tuesday, diagnosed with UTI and prescribed Cipro . On Wednesday, he presented to the ED due to urinary retention, Foley catheter was placed and ultimately discharged to follow-up with urology.  Over the last 2 days, he has had persistent fever as well as dysuria and generalized weakness.  His PCP called to check on him and due to the persistent fevers, he advised to present patient to the ED for further evaluation.  Patient endorsed mild bladder pain but denies any nausea, vomiting, chills, shortness of breath, chest pain or hematuria. Patient found to be febrile to 100.7 by EMS.   ED Course: Initial vitals show temp 98.3, RR 18, HR 89, BP 128/65, SpO2 97% on room air. Initial labs significant for WBC 7.6, Hgb 11.8, normal kidney function and LFTs, albumin 2.4, negative flu, RSV and COVID test, UA shows large hemoglobinuria, negative nitrite, large leuks, RBC 21-50, WBC >50 and rare bacteria. CXR shows no active disease. CT A/P shows mildly enlarged prostate but no acute findings.  Pt received IV NS 1 L bolus and IV Rocephin . TRH was consulted for admission.   Review of Systems: Please see HPI for pertinent positives and negatives. A complete 10 system review of systems are otherwise negative.  Past Medical History:  Diagnosis Date   Adenomatous colon polyp    Bladder cancer (HCC) 2010   BPH (benign prostatic hyperplasia)    Heart murmur    Hyperlipidemia    Hypertension    MVP (mitral valve prolapse) 01-13-13   hx. LBBB previous  EKG 11'11  in Epic.   Parkinson's disease (HCC)    SBO (small bowel obstruction) (HCC) 07/18/2018   Seasonal allergies 01-13-13   Past Surgical History:  Procedure Laterality Date   APPENDECTOMY     BLADDER SURGERY  2010   bladder cancer in 2010/Post surgery bleed   CARDIAC CATHETERIZATION  01-13-13   greater than 5 yrs ago-negative findings   CHOLECYSTECTOMY N/A 01/27/2017   Procedure: LAPAROSCOPIC CHOLECYSTECTOMY WITH INTRAOPERATIVE CHOLANGIOGRAM;  Surgeon: Donnice Bury, MD;  Location: [facility] OR;  Service: General;  Laterality: N/A;   COLONOSCOPY  01-13-13   hx.colon polyps excised-last 3-4 yrs ago   ELBOW SURGERY     left   KNEE ARTHROSCOPY WITH MEDIAL MENISECTOMY Right 01/20/2013   Procedure: KNEE ARTHROSCOPY WITH MEDIAL MENISECTOMY;  Surgeon: Dempsey LULLA Moan, MD;  Location: [facility] ORS;  Service: Orthopedics;  Laterality: Right;  right knee arthroscopy with medial meniscal debridement and chrondroplasty   TONSILLECTOMY     TONSILLECTOMY AND ADENOIDECTOMY     Social History:  reports that he has never smoked. He has never used smokeless tobacco. He reports that he does not drink alcohol and does not use drugs.  Allergies  Allergen Reactions   Erythromycin Nausea Only    REACTION: Intolerance to this medication with upset stomach   Nitroglycerin Other (See Comments)    REACTION: Intolerance to this medication.  It drops his BP too low and quickly.   Sulfa Antibiotics Nausea Only and Rash   Tetracyclines & Related Nausea  Only   Amoxicillin Diarrhea and Nausea Only   Penicillins Swelling and Rash    Has patient had a PCN reaction causing immediate rash, facial/tongue/throat swelling, SOB or lightheadedness with hypotension: Yes Has patient had a PCN reaction causing severe rash involving mucus membranes or skin necrosis: No Has patient had a PCN reaction that required hospitalization: No Has patient had a PCN reaction occurring within the last 10 years: No If all of the above answers  are NO, then may proceed with Cephalosporin use.   REACTION: Patient develops a rash and swelling at inject    Family History  Problem Relation Age of Onset   Breast cancer Mother    Prostate cancer Father    Colon cancer Brother    Cervical cancer Daughter    Thyroid cancer Daughter    Uterine cancer Daughter    Prostate cancer Brother    Alzheimer's disease Sister    Alzheimer's disease Sister    Healthy Son      Prior to Admission medications   Medication Sig Start Date End Date Taking? Authorizing Provider  AMBULATORY NON FORMULARY MEDICATION Lift Chair Dx:  G20 04/30/21   TatAsberry RAMAN, DO  AMBULATORY NON FORMULARY MEDICATION Rollator Dx: G20 04/30/21   TatAsberry RAMAN, DO  Carbidopa -Levodopa  ER (SINEMET  CR) 25-100 MG tablet controlled release Take 2 tablets by mouth 3 (three) times daily. 8am/noon/4pm 02/10/24   Tat, Asberry RAMAN, DO  docusate sodium  (COLACE) 100 MG capsule Take 100 mg by mouth 3 (three) times daily.    [provider]  Multiple Vitamin (MULTIVITAMIN) capsule Take 1 capsule by mouth daily.    [provider]  polyethylene glycol (MIRALAX ) packet Take 17 g by mouth daily as needed for mild constipation. Patient taking differently: Take 17 g by mouth daily. 07/21/18   Vicci Burnard SAUNDERS, PA-C  Tamsulosin  HCl (FLOMAX ) 0.4 MG CAPS Take 0.4 mg by mouth daily.     [provider]    Physical Exam: BP 138/64   Pulse 71   Temp 98.3 F (36.8 C) (Oral)   Resp 20   Ht 5' 10 (1.778 m)   Wt 61.2 kg   SpO2 98%   BMI 19.37 kg/m  General: Pleasant, weak appearing elderly man laying in bed. No acute distress. HEENT: Jeremiah Zimmerman. Anicteric sclera CV: RRR. No murmurs, rubs, or gallops. No LE edema Pulmonary: Lungs CTAB. Normal effort. No wheezing or rales. Abdominal: Soft, nontender, nondistended. Normal bowel sounds. Extremities: Palpable radial and DP pulses. Normal ROM. Skin: Warm and dry. No obvious rash or lesions. GU: Foley catheter stable  in place with yellowish urine in bag Neuro: A&Ox3. Moves all extremities. Normal sensation to light touch. No focal deficit. Psych: Normal mood and affect          Labs on Admission:  Basic Metabolic Panel: Recent Labs  Lab 07/05/24 1811 07/07/24 1634  NA 133* 135  K 3.9 3.5  CL 97* 102  CO2 25 24  GLUCOSE 127* 110*  BUN 14 13  CREATININE 0.90 0.76  CALCIUM 9.1 8.4*   Liver Function Tests: Recent Labs  Lab 07/07/24 1634  AST 39  ALT 12  ALKPHOS 112  BILITOT 0.8  PROT 5.8*  ALBUMIN 2.4*   Recent Labs  Lab 07/07/24 1634  LIPASE 21   No results for input(s): AMMONIA in the last 168 hours. CBC: Recent Labs  Lab 07/05/24 1811 07/07/24 1634  WBC 11.3* 7.6  NEUTROABS  --  5.2  HGB 13.3 11.8*  HCT 41.2 37.7*  MCV 93.6 94.3  PLT 114* 119*   Cardiac Enzymes: No results for input(s): CKTOTAL, CKMB, CKMBINDEX, TROPONINI in the last 168 hours. BNP (last 3 results) No results for input(s): BNP in the last 8760 hours.  ProBNP (last 3 results) No results for input(s): PROBNP in the last 8760 hours.  CBG: No results for input(s): GLUCAP in the last 168 hours.  Radiological Exams on Admission: CT ABDOMEN PELVIS W CONTRAST Result Date: 07/07/2024 CLINICAL DATA:  Acute abdominal pain.  Fever. EXAM: CT ABDOMEN AND PELVIS WITH CONTRAST TECHNIQUE: Multidetector CT imaging of the abdomen and pelvis was performed using the standard protocol following bolus administration of intravenous contrast. RADIATION DOSE REDUCTION: This exam was performed according to the departmental dose-optimization program which includes automated exposure control, adjustment of the mA and/or kV according to patient size and/or use of iterative reconstruction technique. CONTRAST:  OMNIPAQUE  IOHEXOL  300 MG/ML  SOLN COMPARISON:  04/20/2019 FINDINGS: Lower Chest: No acute findings. Hepatobiliary: No suspicious hepatic masses identified. Prior cholecystectomy. No evidence of biliary  obstruction. Pancreas:  No mass or inflammatory changes. Spleen: Within normal limits in size and appearance. Adrenals/Urinary Tract: No suspicious masses identified. Bilateral benign-appearing renal cysts again noted (No followup imaging is recommended). No evidence of ureteral calculi or hydronephrosis. Foley catheter seen in the bladder. Diffuse bladder wall thickening is seen, presumably due to chronic bladder outlet obstruction given enlarged prostate. Stomach/Bowel: No evidence of obstruction, inflammatory process or abnormal fluid collections. Vascular/Lymphatic: No pathologically enlarged lymph nodes. No acute vascular findings. Reproductive:  Stable markedly enlarged prostate. Other:  None. Musculoskeletal:  No suspicious bone lesions identified. IMPRESSION: No acute findings. Stable markedly enlarged prostate, and findings of chronic bladder outlet obstruction. Foley catheter within bladder. Electronically Signed   By: Norleen DELENA Kil M.D.   On: 07/07/2024 18:46   DG Chest Portable 1 View Result Date: 07/07/2024 CLINICAL DATA:  weakness EXAM: PORTABLE CHEST - 1 VIEW COMPARISON:  Apr 05, 2024 FINDINGS: No focal airspace consolidation, pleural effusion, or pneumothorax. No cardiomegaly. Aortic atherosclerosis. No acute fracture or destructive lesions. Multilevel thoracic osteophytosis. IMPRESSION: No acute cardiopulmonary abnormality. Electronically Signed   By: Rogelia Myers M.D.   On: 07/07/2024 17:12   Assessment/Plan Jeremiah Zimmerman is a 89 y.o. male with medical history significant for Parkinson's disease, bladder cancer, BPH, HTN, HLD and recent urinary retention s/p Foley placement on 8/20 now presenting with persistent fevers and admitted for urinary tract infection  # Acute cystitis - Pt with recent urinary retention s/p foley placement presented with persistent fevers and dysuria - Failed outpatient treatment with ciprofloxacin  - Urinalysis shows persistent signs of infection but  improving - Continue IV rocephin  - F/u urine and blood cultures - Trend CBC and fever curve  # Urinary retention # BPH - Patient found to have urinary retention at ED visit on 8/20 now status post Foley placement - He has follow-up scheduled with urology for trial of void in 2 weeks - CT A/P shows stable markedly enlarged prostate and Foley catheter within bladder - Continue Foley catheter - Continue tamsulosin   # Parkinson's disease - Continue home carbidopa  levodopa   # Generalized weakness - In the setting of acute illness - PT/OT eval and treat   DVT prophylaxis: Lovenox      Code Status: Limited: Do not attempt resuscitation (DNR) -DNR-LIMITED -Do Not Intubate/DNI   Consults called: None  Family Communication: Discussed admission with son and daughter at bedside  Severity  of Illness: The appropriate patient status for this patient is INPATIENT. Inpatient status is judged to be reasonable and necessary in order to provide the required intensity of service to ensure the patient's safety. The patient's presenting symptoms, physical exam findings, and initial radiographic and laboratory data in the context of their chronic comorbidities is felt to place them at high risk for further clinical deterioration. Furthermore, it is not anticipated that the patient will be medically stable for discharge from the hospital within 2 midnights of admission.   * I certify that at the point of admission it is my clinical judgment that the patient will require inpatient hospital care spanning beyond 2 midnights from the point of admission due to high intensity of service, high risk for further deterioration and high frequency of surveillance required.*  Level of care: Med-Surg   This record has been created using Conservation officer, historic buildings. Errors have been sought and corrected, but may not always be located. Such creation errors do not reflect on the standard of care.   Lou Claretta HERO, MD 07/07/2024, 7:54 PM [facility] Pager: [phone] Isaiah 41:10   If 7PM-7AM, please contact night-coverage [email or web address] Password [email or web address]

## 2024-07-08 DIAGNOSIS — N390 Urinary tract infection, site not specified: Secondary | ICD-10-CM | POA: Diagnosis not present

## 2024-07-08 DIAGNOSIS — R531 Weakness: Secondary | ICD-10-CM | POA: Diagnosis not present

## 2024-07-08 DIAGNOSIS — R338 Other retention of urine: Secondary | ICD-10-CM | POA: Diagnosis not present

## 2024-07-08 LAB — URINE CULTURE: Culture: NO GROWTH

## 2024-07-08 LAB — BLOOD CULTURE ID PANEL (REFLEXED) - BCID2

## 2024-07-08 LAB — CBC
HCT: 36.4 % — ABNORMAL LOW (ref 39.0–52.0)
Hemoglobin: 11.3 g/dL — ABNORMAL LOW (ref 13.0–17.0)
MCH: 29.7 pg (ref 26.0–34.0)
MCHC: 31 g/dL (ref 30.0–36.0)
MCV: 95.8 fL (ref 80.0–100.0)
Platelets: 126 K/uL — ABNORMAL LOW (ref 150–400)
RBC: 3.8 MIL/uL — ABNORMAL LOW (ref 4.22–5.81)
RDW: 12.4 % (ref 11.5–15.5)
WBC: 7.7 K/uL (ref 4.0–10.5)
nRBC: 0 % (ref 0.0–0.2)

## 2024-07-08 LAB — BASIC METABOLIC PANEL WITH GFR
Anion gap: 7 (ref 5–15)
BUN: 12 mg/dL (ref 8–23)
CO2: 25 mmol/L (ref 22–32)
Calcium: 8.2 mg/dL — ABNORMAL LOW (ref 8.9–10.3)
Chloride: 105 mmol/L (ref 98–111)
Creatinine, Ser: 0.75 mg/dL (ref 0.61–1.24)
GFR, Estimated: >60 mL/min (ref >60)
Glucose, Bld: 120 mg/dL — ABNORMAL HIGH (ref 70–99)
Potassium: 3.5 mmol/L (ref 3.5–5.1)
Sodium: 137 mmol/L (ref 135–145)

## 2024-07-08 MED ORDER — KCL IN DEXTROSE-NACL 10-5-0.45 MEQ/L-%-% IV SOLN
INTRAVENOUS | Status: AC
  Filled 2024-07-08: qty 1000

## 2024-07-08 MED ORDER — ACETAMINOPHEN 160 MG/5ML PO SOLN
650.0000 mg | Freq: Four times a day (QID) | ORAL | Status: DC | PRN
  Administered 2024-07-09 – 2024-07-10 (×5): 650 mg via ORAL
  Filled 2024-07-08 (×5): qty 20.3

## 2024-07-08 MED ORDER — CHLORHEXIDINE GLUCONATE CLOTH 2 % EX PADS
6.0000 | MEDICATED_PAD | Freq: Every day | CUTANEOUS | Status: DC
  Administered 2024-07-09 – 2024-07-12 (×4): 6 via TOPICAL

## 2024-07-08 MED ORDER — SODIUM CHLORIDE 0.9 % IV SOLN
1.0000 g | INTRAVENOUS | Status: DC
  Administered 2024-07-08 – 2024-07-10 (×3): 1 g via INTRAVENOUS
  Filled 2024-07-08 (×3): qty 10

## 2024-07-08 MED ORDER — ACETAMINOPHEN 650 MG RE SUPP: 650.0000 mg | Freq: Four times a day (QID) | RECTAL | Status: DC | PRN

## 2024-07-08 NOTE — Plan of Care (Signed)
  Problem: Activity: Goal: Risk for activity intolerance will decrease Outcome: Progressing   Problem: Nutrition: Goal: Adequate nutrition will be maintained Outcome: Progressing   Problem: Coping: Goal: Level of anxiety will decrease Outcome: Progressing   Problem: Elimination: Goal: Will not experience complications related to bowel motility Outcome: Progressing   Problem: Pain Managment: Goal: General experience of comfort will improve and/or be controlled Outcome: Progressing

## 2024-07-08 NOTE — Progress Notes (Signed)
   07/08/24 1544  PT Visit Information  Last PT Received On 07/08/24  Reason Eval/Treat Not Completed Fatigue/lethargy limiting ability to participate  History of Present Illness Jeremiah Zimmerman is a 89 y.o. male admitted 07/07/24 with medical history significant for Parkinson's disease, bladder cancer, BPH, HTN, HLD and recent urinary retention s/p Foley placement on 8/20 now presenting with persistent fevers.   Pt very fatigued today, checked on at 1130 and 1540 today, pt defers consult to tomorrow. He has been getting up to use the bathroom and completing small bouts of activity as tolerated but has persistent fatigue. Will follow up in the morning.   Stann, PT Acute Rehabilitation Services Office: [phone] 07/08/2024

## 2024-07-08 NOTE — Progress Notes (Signed)
   07/08/24 1620  TOC Brief Assessment  Insurance and Status Reviewed  Patient has primary care physician Yes  Home environment has been reviewed single family home  Prior level of function: independent  Prior/Current Home Services No current home services  Social Drivers of Health Review SDOH reviewed no interventions necessary  Readmission risk has been reviewed Yes  Transition of care needs transition of care needs identified, TOC will continue to follow    Signed: Heather Saltness, MSW, LCSW Clinical Social Worker Inpatient Care Management 07/08/2024 4:21 PM

## 2024-07-08 NOTE — Progress Notes (Signed)
 PHARMACY - PHYSICIAN COMMUNICATION CRITICAL VALUE ALERT - BLOOD CULTURE IDENTIFICATION (BCID)  Jeremiah Zimmerman is an 89 y.o. male who presented to [facility] on 07/07/2024 with a chief complaint of fever, weakness. Recently treated for UTI with ciprofloxacin .  Assessment:  Bcx 1/4 staph species, likely contaminant Pt on ceftriaxone  for UTI  Name of physician (or Provider) Contacted: Dr. Drusilla  Current antibiotics: ceftriaxone   Changes to prescribed antibiotics recommended: none, likely contaminant. Do not recommend escalation of abx at this time  Results for orders placed or performed during the hospital encounter of 07/07/24  Blood Culture ID Panel (Reflexed) (Collected: 07/07/2024  4:34 PM)  Result Value Ref Range   Enterococcus faecalis NOT DETECTED NOT DETECTED   Enterococcus Faecium NOT DETECTED NOT DETECTED   Listeria monocytogenes NOT DETECTED NOT DETECTED   Staphylococcus species DETECTED (A) NOT DETECTED   Staphylococcus aureus (BCID) NOT DETECTED NOT DETECTED   Staphylococcus epidermidis NOT DETECTED NOT DETECTED   Staphylococcus lugdunensis NOT DETECTED NOT DETECTED   Streptococcus species NOT DETECTED NOT DETECTED   Streptococcus agalactiae NOT DETECTED NOT DETECTED   Streptococcus pneumoniae NOT DETECTED NOT DETECTED   Streptococcus pyogenes NOT DETECTED NOT DETECTED   A.calcoaceticus-baumannii NOT DETECTED NOT DETECTED   Bacteroides fragilis NOT DETECTED NOT DETECTED   Enterobacterales NOT DETECTED NOT DETECTED   Enterobacter cloacae complex NOT DETECTED NOT DETECTED   Escherichia coli NOT DETECTED NOT DETECTED   Klebsiella aerogenes NOT DETECTED NOT DETECTED   Klebsiella oxytoca NOT DETECTED NOT DETECTED   Klebsiella pneumoniae NOT DETECTED NOT DETECTED   Proteus species NOT DETECTED NOT DETECTED   Salmonella species NOT DETECTED NOT DETECTED   Serratia marcescens NOT DETECTED NOT DETECTED   Haemophilus influenzae NOT DETECTED NOT DETECTED   Neisseria meningitidis  NOT DETECTED NOT DETECTED   Pseudomonas aeruginosa NOT DETECTED NOT DETECTED   Stenotrophomonas maltophilia NOT DETECTED NOT DETECTED   Candida albicans NOT DETECTED NOT DETECTED   Candida auris NOT DETECTED NOT DETECTED   Candida glabrata NOT DETECTED NOT DETECTED   Candida krusei NOT DETECTED NOT DETECTED   Candida parapsilosis NOT DETECTED NOT DETECTED   Candida tropicalis NOT DETECTED NOT DETECTED   Cryptococcus neoformans/gattii NOT DETECTED NOT DETECTED    Stefano MARLA Bologna, PharmD, BCPS Clinical Pharmacist 07/08/2024 2:54 PM

## 2024-07-08 NOTE — Plan of Care (Signed)

## 2024-07-08 NOTE — Progress Notes (Signed)
 [facility]  PROGRESS NOTE  Jeremiah Zimmerman FMW:[id number] DOB: 13-Jan-1934 DOA: 07/07/2024 PCP: Onita Rush, MD   Brief HPI:    89 y.o. male with medical history significant for Parkinson's disease, bladder cancer, BPH, HTN, HLD and recent urinary retention s/p Foley placement on 8/20 now presenting with persistent fevers.  Per daughter, patient started having fever last Sunday up to 102.3.  He was evaluated by PCP on Tuesday, diagnosed with UTI and prescribed Cipro . On Wednesday, he presented to the ED due to urinary retention, Foley catheter was placed and ultimately discharged to follow-up with urology.     Assessment/Plan:   # Acute cystitis - Pt with recent urinary retention s/p foley placement presented with persistent fevers and dysuria - Failed outpatient treatment with ciprofloxacin ; patient has been on ciprofloxacin  for 3 days before coming to the hospital - Urinalysis shows persistent signs of infection but improving - Continue IV rocephin  - F/u urine and blood cultures - Trend CBC and fever curve   # Urinary retention # BPH - Patient found to have urinary retention at ED visit on 8/20 now status post Foley placement - He has follow-up scheduled with urology for trial of void in 2 weeks - CT A/P shows stable markedly enlarged prostate and Foley catheter within bladder - Continue Foley catheter - Continue tamsulosin    # Parkinson's disease - Continue home carbidopa  levodopa    # Generalized weakness - In the setting of acute illness - PT/OT eval and treat        Medications     Carbidopa -Levodopa  ER  2 tablet Oral TID   enoxaparin  (LOVENOX ) injection  40 mg Subcutaneous QHS   tamsulosin   0.4 mg Oral Daily     Data Reviewed:   CBG:  No results for input(s): GLUCAP in the last 168 hours.  SpO2: 94 %    Vitals:   07/07/24 1630 07/07/24 1915 07/07/24 2032 07/08/24 0607  BP: (!) 138/59 138/64 (!) 149/66 139/68  Pulse: 82 71 77 81  Resp: 20  18    Temp: 98.3 F (36.8 C)  98.2 F (36.8 C) 97.9 F (36.6 C)  TempSrc: Oral   Oral  SpO2: 94% 98% 96% 94%  Weight:      Height:          Data Reviewed:  Basic Metabolic Panel: Recent Labs  Lab 07/05/24 1811 07/07/24 1634 07/08/24 0354  NA 133* 135 137  K 3.9 3.5 3.5  CL 97* 102 105  CO2 25 24 25   GLUCOSE 127* 110* 120*  BUN 14 13 12   CREATININE 0.90 0.76 0.75  CALCIUM 9.1 8.4* 8.2*    CBC: Recent Labs  Lab 07/05/24 1811 07/07/24 1634 07/08/24 0354  WBC 11.3* 7.6 7.7  NEUTROABS  --  5.2  --   HGB 13.3 11.8* 11.3*  HCT 41.2 37.7* 36.4*  MCV 93.6 94.3 95.8  PLT 114* 119* 126*    LFT Recent Labs  Lab 07/07/24 1634  AST 39  ALT 12  ALKPHOS 112  BILITOT 0.8  PROT 5.8*  ALBUMIN 2.4*     Antibiotics: Anti-infectives (From admission, onward)    Start     Dose/Rate Route Frequency Ordered Stop   07/07/24 1915  cefTRIAXone  (ROCEPHIN ) 1 g in sodium chloride  0.9 % 100 mL IVPB        1 g 200 mL/hr over 30 Minutes Intravenous  Once 07/07/24 1905 07/07/24 2006        DVT prophylaxis: Lovenox   Code  Status: DNR  Family Communication: Discussed with patient's daughter at bedside   CONSULTS    Subjective   Complains of lower abdominal pain   Objective    Physical Examination:   General: Appears in no acute distress Cardiovascular: S1-S2, regular Respiratory: Lungs clear to auscultation bilaterally Abdomen: Abdominal tenderness in suprapubic region Extremities: No edema in the lower extremities Neurologic: Alert, oriented x 3, moving all extremities, intact insight and judgment   Status is: Inpatient:             Sabas GORMAN Brod   [facility] If 7PM-7AM, please contact night-coverage at [email or web address], Office  [phone]   07/08/2024, 8:50 AM  LOS: 1 day

## 2024-07-09 DIAGNOSIS — N390 Urinary tract infection, site not specified: Secondary | ICD-10-CM | POA: Diagnosis not present

## 2024-07-09 LAB — CBC
HCT: 36.3 % — ABNORMAL LOW (ref 39.0–52.0)
Hemoglobin: 11.5 g/dL — ABNORMAL LOW (ref 13.0–17.0)
MCH: 30.5 pg (ref 26.0–34.0)
MCHC: 31.7 g/dL (ref 30.0–36.0)
MCV: 96.3 fL (ref 80.0–100.0)
Platelets: 152 K/uL (ref 150–400)
RBC: 3.77 MIL/uL — ABNORMAL LOW (ref 4.22–5.81)
RDW: 12.5 % (ref 11.5–15.5)
WBC: 7.7 K/uL (ref 4.0–10.5)
nRBC: 0 % (ref 0.0–0.2)

## 2024-07-09 LAB — COMPREHENSIVE METABOLIC PANEL WITH GFR
ALT: 7 U/L (ref 0–44)
AST: 26 U/L (ref 15–41)
Albumin: 2.2 g/dL — ABNORMAL LOW (ref 3.5–5.0)
Alkaline Phosphatase: 105 U/L (ref 38–126)
Anion gap: 6 (ref 5–15)
BUN: 13 mg/dL (ref 8–23)
CO2: 25 mmol/L (ref 22–32)
Calcium: 8.1 mg/dL — ABNORMAL LOW (ref 8.9–10.3)
Chloride: 105 mmol/L (ref 98–111)
Creatinine, Ser: 0.57 mg/dL — ABNORMAL LOW (ref 0.61–1.24)
GFR, Estimated: >60 mL/min (ref >60)
Glucose, Bld: 125 mg/dL — ABNORMAL HIGH (ref 70–99)
Potassium: 3.7 mmol/L (ref 3.5–5.1)
Sodium: 136 mmol/L (ref 135–145)
Total Bilirubin: 0.5 mg/dL (ref 0.0–1.2)
Total Protein: 5.2 g/dL — ABNORMAL LOW (ref 6.5–8.1)

## 2024-07-09 LAB — CULTURE, BLOOD (ROUTINE X 2): Special Requests: ADEQUATE

## 2024-07-09 NOTE — Progress Notes (Signed)
 [facility]  PROGRESS NOTE  Jeremiah Zimmerman FMW:[id number] DOB: 07-29-1934 DOA: 07/07/2024 PCP: Jeremiah Rush, MD   Brief HPI:    89 y.o. male with medical history significant for Parkinson's disease, bladder cancer, BPH, HTN, HLD and recent urinary retention s/p Foley placement on 8/20 now presenting with persistent fevers.  Per daughter, patient started having fever last Sunday up to 102.3.  He was evaluated by PCP on Tuesday, diagnosed with UTI and prescribed Cipro . On Wednesday, he presented to the ED due to urinary retention, Foley catheter was placed and ultimately discharged to follow-up with urology.     Assessment/Plan:   # Acute cystitis - Pt with recent urinary retention s/p foley placement presented with persistent fevers and dysuria - Failed outpatient treatment with ciprofloxacin ; patient has been on ciprofloxacin  for 3 days before coming to the hospital - Urinalysis shows persistent signs of infection but improving - Continue IV rocephin  - F/u urine and blood cultures - Trend CBC and fever curve   # Urinary retention # BPH - Patient found to have urinary retention at ED visit on 8/20 now status post Foley placement - He has follow-up scheduled with urology for trial of void in 2 weeks - CT A/P shows stable markedly enlarged prostate and Foley catheter within bladder - Continue Foley catheter - Continue tamsulosin    # Parkinson's disease - Continue home carbidopa  levodopa    # Generalized weakness - In the setting of acute illness - PT/OT eval and treat        Medications     Carbidopa -Levodopa  ER  2 tablet Oral TID   Chlorhexidine  Gluconate Cloth  6 each Topical Daily   enoxaparin  (LOVENOX ) injection  40 mg Subcutaneous QHS   tamsulosin   0.4 mg Oral Daily     Data Reviewed:   CBG:  No results for input(s): GLUCAP in the last 168 hours.  SpO2: 95 %    Vitals:   07/08/24 0916 07/08/24 1420 07/08/24 2215 07/09/24 0537  BP: (!) 124/58 137/70  (!) 140/63 (!) 156/69  Pulse: 99 81 80 89  Resp: 14 16 16 18   Temp: 98.4 F (36.9 C) 98.6 F (37 C) 98.1 F (36.7 C) 99 F (37.2 C)  TempSrc: Oral Oral Oral Oral  SpO2: 97% 96% 97% 95%  Weight:      Height:          Data Reviewed:  Basic Metabolic Panel: Recent Labs  Lab 07/05/24 1811 07/07/24 1634 07/08/24 0354 07/09/24 0303  NA 133* 135 137 136  K 3.9 3.5 3.5 3.7  CL 97* 102 105 105  CO2 25 24 25 25   GLUCOSE 127* 110* 120* 125*  BUN 14 13 12 13   CREATININE 0.90 0.76 0.75 0.57*  CALCIUM 9.1 8.4* 8.2* 8.1*    CBC: Recent Labs  Lab 07/05/24 1811 07/07/24 1634 07/08/24 0354 07/09/24 0303  WBC 11.3* 7.6 7.7 7.7  NEUTROABS  --  5.2  --   --   HGB 13.3 11.8* 11.3* 11.5*  HCT 41.2 37.7* 36.4* 36.3*  MCV 93.6 94.3 95.8 96.3  PLT 114* 119* 126* 152    LFT Recent Labs  Lab 07/07/24 1634 07/09/24 0303  AST 39 26  ALT 12 7  ALKPHOS 112 105  BILITOT 0.8 0.5  PROT 5.8* 5.2*  ALBUMIN 2.4* 2.2*     Antibiotics: Anti-infectives (From admission, onward)    Start     Dose/Rate Route Frequency Ordered Stop   07/08/24 1800  cefTRIAXone  (ROCEPHIN ) 1  g in sodium chloride  0.9 % 100 mL IVPB        1 g 200 mL/hr over 30 Minutes Intravenous Every 24 hours 07/08/24 1153     07/07/24 1915  cefTRIAXone  (ROCEPHIN ) 1 g in sodium chloride  0.9 % 100 mL IVPB        1 g 200 mL/hr over 30 Minutes Intravenous  Once 07/07/24 1905 07/07/24 2006        DVT prophylaxis: Lovenox   Code Status: DNR  Family Communication: Discussed with patient's daughter at bedside   CONSULTS    Subjective   Complains of lower abdominal pain   Objective    Physical Examination:    General: Appearance:    Thin male in no acute distress     Lungs:     respirations unlabored  Heart:    Normal heart rate. Normal rhythm. No murmurs, rubs, or gallops.   MS:   All extremities are intact.   Neurologic:   Awake, alert,      Status is: Inpatient:             Jeremiah Zimmerman Bowl   [facility] If 7PM-7AM, please contact night-coverage at [email or web address], Office  [phone]   07/09/2024, 11:00 AM  LOS: 2 days

## 2024-07-09 NOTE — Progress Notes (Signed)
 OT Cancellation Note  Patient Details Name: [name] MRN: [id number] DOB: April 21, 1934   Cancelled Treatment:    Reason Eval/Treat Not Completed: Fatigue/lethargy limiting ability to participate. Pt was sleeping and his daughter asked if therapy could check back later this afternoon.   Delanna JINNY Lesches, OTR/L 07/09/2024, 5:57 PM

## 2024-07-09 NOTE — Evaluation (Signed)
 Physical Therapy Evaluation Patient Details Name: Jeremiah Zimmerman MRN: [id number] DOB: Apr 19, 1934 Today's Date: 07/09/2024  History of Present Illness  Jeremiah Zimmerman is a 89 y.o. male admitted 07/07/24 with medical history significant for Parkinson's disease, bladder cancer, BPH, HTN, HLD and recent urinary retention s/p Foley placement on 8/20 now presenting with persistent fevers.  Clinical Impression  Pt admitted with above diagnosis. Pt and family agreeable to session, pt resting in chair. He is able to complete step pivot to the bed at [facility] with RW with inc time consistent with PD. He is able to amb x69ft with RW at [facility] but limited in activity tolerance and endurance, pt returns to bed. There are 2 steps, 1+1 at his home from carport to sunroom and sunroom to kitchen with rails, I anticipate pt will restore functional strength and stability to complete this task during admission, will continue to follow. Pt currently with functional limitations due to the deficits listed below (see PT Problem List). Pt will benefit from acute skilled PT to increase their independence and safety with mobility to allow discharge.           If plan is discharge home, recommend the following: A little help with walking and/or transfers;A little help with bathing/dressing/bathroom;Assistance with cooking/housework;Help with stairs or ramp for entrance;Assist for transportation   Can travel by private vehicle        Equipment Recommendations Rolling walker (2 wheels)  Recommendations for Other Services       Functional Status Assessment Patient has had a recent decline in their functional status and demonstrates the ability to make significant improvements in function in a reasonable and predictable amount of time.     Precautions / Restrictions Precautions Precautions: Fall Recall of Precautions/Restrictions: Intact Restrictions Weight Bearing Restrictions Per Provider Order: No      Mobility  Bed  Mobility Overal bed mobility: Needs Assistance Bed Mobility: Sit to Supine       Sit to supine: Contact guard assist   General bed mobility comments: LE assist    Transfers Overall transfer level: Needs assistance Equipment used: Rolling walker (2 wheels) Transfers: Sit to/from Stand, Bed to chair/wheelchair/BSC Sit to Stand: Contact guard assist   Step pivot transfers: Contact guard assist       General transfer comment: Pt able to complete STS and step pivot bed<>chair at [facility] with RW, requires inc time    Ambulation/Gait Ambulation/Gait assistance: Contact guard assist Gait Distance (Feet): 40 Feet Assistive device: Rolling walker (2 wheels) Gait Pattern/deviations: Step-through pattern, Decreased stride length, Narrow base of support Gait velocity: dec     General Gait Details: small, slow steps completed for amb into the hallway, pt has decreased strength and endurance and is able to complete this distance near baseline function of quality/tolerance, but is not able to tolerate greater distances and requires physical assist to maintain safety. Directional change completed safely  Stairs            Wheelchair Mobility     Tilt Bed    Modified Rankin (Stroke Patients Only)       Balance Overall balance assessment: Needs assistance Sitting-balance support: Feet supported, No upper extremity supported Sitting balance-Leahy Scale: Fair     Standing balance support: During functional activity, Reliant on assistive device for balance Standing balance-Leahy Scale: Good  Pertinent Vitals/Pain Pain Assessment Pain Assessment: No/denies pain    Home Living Family/patient expects to be discharged to:: Private residence Living Arrangements: Spouse/significant other Available Help at Discharge: Family;Available 24 hours/day Type of Home: House Home Access: Stairs to enter Entrance Stairs-Rails: Right Entrance  Stairs-Number of Steps: 1   Home Layout: One level Home Equipment: Cane - single point;Rollator (4 wheels);Shower seat;Lift chair;Toilet riser      Prior Function Prior Level of Function : Independent/Modified Independent             Mobility Comments: able to walk around the home and complete basic mobility, does not leave house often       Extremity/Trunk Assessment   Upper Extremity Assessment Upper Extremity Assessment: Defer to OT evaluation    Lower Extremity Assessment Lower Extremity Assessment: Generalized weakness       Communication   Communication Communication: No apparent difficulties    Cognition Arousal: Alert Behavior During Therapy: WFL for tasks assessed/performed, Flat affect   PT - Cognitive impairments: No apparent impairments                         Following commands: Intact       Cueing Cueing Techniques: Verbal cues, Gestural cues     General Comments      Exercises     Assessment/Plan    PT Assessment Patient needs continued PT services  PT Problem List Decreased strength;Decreased balance;Decreased mobility;Decreased activity tolerance       PT Treatment Interventions DME instruction;Functional mobility training;Balance training;Patient/family education;Gait training;Therapeutic activities;Stair training;Therapeutic exercise    PT Goals (Current goals can be found in the Care Plan section)  Acute Rehab PT Goals Patient Stated Goal: restore strength and endurance PT Goal Formulation: With patient Time For Goal Achievement: 07/23/24 Potential to Achieve Goals: Good    Frequency Min 3X/week     Co-evaluation               AM-PAC PT 6 Clicks Mobility  Outcome Measure Help needed turning from your back to your side while in a flat bed without using bedrails?: A Little Help needed moving from lying on your back to sitting on the side of a flat bed without using bedrails?: A Little Help needed moving  to and from a bed to a chair (including a wheelchair)?: A Little Help needed standing up from a chair using your arms (e.g., wheelchair or bedside chair)?: A Little Help needed to walk in hospital room?: A Little Help needed climbing 3-5 steps with a railing? : A Lot 6 Click Score: 17    End of Session Equipment Utilized During Treatment: Gait belt Activity Tolerance: Patient limited by fatigue Patient left: in bed;with bed alarm set;with call bell/phone within reach;with family/visitor present Nurse Communication: Mobility status PT Visit Diagnosis: Muscle weakness (generalized) (M62.81);Unsteadiness on feet (R26.81);Difficulty in walking, not elsewhere classified (R26.2);Other symptoms and signs involving the nervous system (R29.898)    Time: 1019-1050 PT Time Calculation (min) (ACUTE ONLY): 31 min   Charges:   PT Evaluation $PT Eval Low Complexity: 1 Low PT Treatments $Gait Training: 8-22 mins PT General Charges $$ ACUTE PT VISIT: 1 Visit         Stann, PT Acute Rehabilitation Services Office: [phone] 07/09/2024   Stann DELENA Ohara 07/09/2024, 10:58 AM

## 2024-07-09 NOTE — Care Management CC44 (Signed)
 Condition Code 44 Documentation Completed  Patient Details  Name: Jeremiah Zimmerman MRN: [id number] Date of Birth: March 05, 1934   Condition Code 44 given:  Yes Patient signature on Condition Code 44 notice:   Documentation of 2 MD's agreement:  Yes Code 44 added to claim:  Yes    Kameshia Madruga M Hines Kloss, LCSW 07/09/2024, 3:22 PM

## 2024-07-09 NOTE — Evaluation (Signed)
 Occupational Therapy Evaluation Patient Details Name: Jeremiah Zimmerman MRN: [id number] DOB: 01-Oct-1934 Today's Date: 07/09/2024   History of Present Illness   Jeremiah Zimmerman is a 89 yr old male admitted 07/07/24 with medical history significant for Parkinson's disease, bladder cancer, BPH, HTN, HLD and recent urinary retention s/p Foley placement on 8/20 now presenting with persistent fevers.     Clinical Impressions The pt is currently presenting below is baseline level of functioning for self care management. He is limited by the below listed deficits (see OT problem list). During the session, he reported feeling weak and quite fatigued, and therefore gently declined to attempt EOB or out of bed activity. He will benefit from further OT services to maximize his independence with self care tasks and to decrease the risk for restricted participation in meaningful activities. The pt's daughter declines SNF rehab, and plans to take the pt home with home therapy and family assist at discharge. She stated she can stay with the pt & assist daily if needed      If plan is discharge home, recommend the following:   Assistance with cooking/housework;Help with stairs or ramp for entrance;Assist for transportation;A lot of help with bathing/dressing/bathroom     Functional Status Assessment   Patient has had a recent decline in their functional status and demonstrates the ability to make significant improvements in function in a reasonable and predictable amount of time.     Equipment Recommendations   BSC/3in1     Recommendations for Other Services         Precautions/Restrictions   Precautions Precautions: Fall Restrictions Weight Bearing Restrictions Per Provider Order: No     Mobility Bed Mobility      General bed mobility comments: The reported feeling too weak and fatigued to roll in bed    Transfers      General transfer comment:  (The pt reported feeling too weak  and fatigued to stand)      Balance       Sitting balance - Comments: unable to assess       Standing balance comment: unable to assess           ADL either performed or assessed with clinical judgement   ADL Overall ADL's : Needs assistance/impaired Eating/Feeding: Sitting;Moderate assistance Eating/Feeding Details (indicate cue type and reason): Pt's daughter reported she has been assisting the pt at least 50% with feeding during this hospitalization, given his increased weakness and intermittent baseline UE tremors Grooming: Minimal assistance;Bed level           Upper Body Dressing : Minimal assistance;Bed level   Lower Body Dressing: Maximal assistance;Bed level              Vision   Additional Comments: He correctly read the time depicted on the wall clock.            Pertinent Vitals/Pain Pain Assessment Pain Assessment: No/denies pain     Extremity/Trunk Assessment Upper Extremity Assessment Upper Extremity Assessment: RUE deficits/detail;LUE deficits/detail;Right hand dominant RUE Deficits / Details: AROM WFL. Functional grip strength. Intermittent tremors noted LUE Deficits / Details: AROM WFL. Functional grip strength   Lower Extremity Assessment Lower Extremity Assessment: Generalized weakness       Communication Communication Communication: No apparent difficulties   Cognition Arousal: Alert Behavior During Therapy: WFL for tasks assessed/performed            Following commands: Intact       Cueing  General Comments  Cueing Techniques: Verbal cues;Gestural cues      Exercises   Shoulder Instructions     Home Living Family/patient expects to be discharged to:: Private residence Living Arrangements: Spouse/significant other Available Help at Discharge: Family;Available 24 hours/day Type of Home: House Home Access: Stairs to enter [employer] of Steps:  (1+1)   Home Layout: One level     Bathroom  Shower/Tub: Producer, television/film/video: Handicapped height     Home Equipment: Shower seat;Cane - single point;Rollator (4 wheels)          Prior Functioning/Environment Prior Level of Function : Independent/Modified Independent             Mobility Comments:  (Uses a rollator for household ambulation.) ADLs Comments:  (Modified independent to independent with ADLs. Does not drive. His daughetrs/family bring meals and perform most of the cleaning.)    OT Problem List: Decreased strength;Decreased activity tolerance;Impaired balance (sitting and/or standing);Decreased coordination;Impaired UE functional use   OT Treatment/Interventions: Self-care/ADL training;Therapeutic exercise;Therapeutic activities;Neuromuscular education;Energy conservation;Patient/family education;DME and/or AE instruction;Balance training      OT Goals(Current goals can be found in the care plan section)   Acute Rehab OT Goals Patient Stated Goal: to get stronger and return home at discharge OT Goal Formulation: With patient/family Time For Goal Achievement: 07/23/24 Potential to Achieve Goals: Good ADL Goals Pt Will Perform Grooming: with supervision;standing;sitting Pt Will Perform Lower Body Dressing: with supervision;sitting/lateral leans;sit to/from stand Pt Will Transfer to Toilet: with supervision;ambulating Pt Will Perform Toileting - Clothing Manipulation and hygiene: with supervision;sit to/from stand   OT Frequency:  Min 2X/week       AM-PAC OT 6 Clicks Daily Activity     Outcome Measure Help from another person eating meals?: A Lot Help from another person taking care of personal grooming?: A Little Help from another person toileting, which includes using toliet, bedpan, or urinal?: A Lot Help from another person bathing (including washing, rinsing, drying)?: A Lot Help from another person to put on and taking off regular upper body clothing?: A Little Help from another  person to put on and taking off regular lower body clothing?: A Lot 6 Click Score: 14   End of Session Equipment Utilized During Treatment: Other (comment) (n/A) Nurse Communication: Mobility status  Activity Tolerance: Patient limited by fatigue Patient left: in bed;with call bell/phone within reach;with bed alarm set;with family/visitor present  OT Visit Diagnosis: Unsteadiness on feet (R26.81);Other abnormalities of gait and mobility (R26.89);Muscle weakness (generalized) (M62.81);Feeding difficulties (R63.3)                Time: 8351-8288 OT Time Calculation (min): 23 min Charges:  OT General Charges $OT Visit: 1 Visit OT Evaluation $OT Eval Moderate Complexity: 1 Mod  Delanna JINNY Lesches, OTR/L 07/09/2024, 5:57 PM

## 2024-07-10 DIAGNOSIS — N390 Urinary tract infection, site not specified: Secondary | ICD-10-CM | POA: Diagnosis not present

## 2024-07-10 LAB — BASIC METABOLIC PANEL WITH GFR
Anion gap: 7 (ref 5–15)
BUN: 11 mg/dL (ref 8–23)
CO2: 24 mmol/L (ref 22–32)
Calcium: 8.1 mg/dL — ABNORMAL LOW (ref 8.9–10.3)
Chloride: 107 mmol/L (ref 98–111)
Creatinine, Ser: 0.61 mg/dL (ref 0.61–1.24)
GFR, Estimated: >60 mL/min (ref >60)
Glucose, Bld: 100 mg/dL — ABNORMAL HIGH (ref 70–99)
Potassium: 3.9 mmol/L (ref 3.5–5.1)
Sodium: 138 mmol/L (ref 135–145)

## 2024-07-10 LAB — CBC
HCT: 37.4 % — ABNORMAL LOW (ref 39.0–52.0)
Hemoglobin: 11.6 g/dL — ABNORMAL LOW (ref 13.0–17.0)
MCH: 29.9 pg (ref 26.0–34.0)
MCHC: 31 g/dL (ref 30.0–36.0)
MCV: 96.4 fL (ref 80.0–100.0)
Platelets: 157 K/uL (ref 150–400)
RBC: 3.88 MIL/uL — ABNORMAL LOW (ref 4.22–5.81)
RDW: 12.5 % (ref 11.5–15.5)
WBC: 8.3 K/uL (ref 4.0–10.5)
nRBC: 0 % (ref 0.0–0.2)

## 2024-07-10 NOTE — Progress Notes (Signed)
 [facility]  PROGRESS NOTE  Jeremiah Zimmerman FMW:[id number] DOB: 02-03-1934 DOA: 07/07/2024 PCP: Onita Rush, MD   Brief HPI:    89 y.o. male with medical history significant for Parkinson's disease, bladder cancer, BPH, HTN, HLD and recent urinary retention s/p Foley placement on 8/20 now presenting with persistent fevers.  Per daughter, patient started having fever last Sunday up to 102.3.  He was evaluated by PCP on Tuesday, diagnosed with UTI and prescribed Cipro . On Wednesday, he presented to the ED due to urinary retention, Foley catheter was placed and ultimately discharged to follow-up with urology.     Assessment/Plan:   # Acute cystitis - Pt with recent urinary retention s/p foley placement presented with persistent fevers and dysuria - Failed outpatient treatment with ciprofloxacin ; patient has been on ciprofloxacin  for 3 days before coming to the hospital - Urinalysis shows persistent signs of infection but improving-- exam still concerning for pain with palpation in lower quadrants - Continue IV rocephin  - culture from [facility]:       # Urinary retention # BPH - Patient found to have urinary retention at ED visit on 8/20 now status post Foley placement - He has follow-up scheduled with urology for trial of void in 2 weeks - CT A/P shows stable markedly enlarged prostate and Foley catheter within bladder - Continue Foley catheter - Continue tamsulosin  -asked urology to see-- some discharge around catheter and redness/swelling of penile shaft -continue to have pain with urination   # Parkinson's disease - Continue home carbidopa  levodopa    # Generalized weakness - In the setting of acute illness - PT/OT eval and treat- home health   Family asking for palliative care eval while in hospital        Medications     Carbidopa -Levodopa  ER  2 tablet Oral TID   Chlorhexidine  Gluconate Cloth  6 each Topical Daily   enoxaparin  (LOVENOX ) injection  40 mg Subcutaneous  QHS   tamsulosin   0.4 mg Oral Daily     Data Reviewed:   CBG:  No results for input(s): GLUCAP in the last 168 hours.  SpO2: 97 %    Vitals:   07/09/24 1344 07/09/24 2228 07/10/24 0550 07/10/24 0846  BP: (!) 147/70 130/64 (!) 153/71 (!) 146/68  Pulse: 77 79 85 84  Resp: 17 16 18 14   Temp: 98.1 F (36.7 C) 98 F (36.7 C) 97.7 F (36.5 C) 99.3 F (37.4 C)  TempSrc: Oral Oral Oral   SpO2: 97% 96% 97% 97%  Weight:      Height:          Data Reviewed:  Basic Metabolic Panel: Recent Labs  Lab 07/05/24 1811 07/07/24 1634 07/08/24 0354 07/09/24 0303 07/10/24 0326  NA 133* 135 137 136 138  K 3.9 3.5 3.5 3.7 3.9  CL 97* 102 105 105 107  CO2 25 24 25 25 24   GLUCOSE 127* 110* 120* 125* 100*  BUN 14 13 12 13 11   CREATININE 0.90 0.76 0.75 0.57* 0.61  CALCIUM 9.1 8.4* 8.2* 8.1* 8.1*    CBC: Recent Labs  Lab 07/05/24 1811 07/07/24 1634 07/08/24 0354 07/09/24 0303 07/10/24 0326  WBC 11.3* 7.6 7.7 7.7 8.3  NEUTROABS  --  5.2  --   --   --   HGB 13.3 11.8* 11.3* 11.5* 11.6*  HCT 41.2 37.7* 36.4* 36.3* 37.4*  MCV 93.6 94.3 95.8 96.3 96.4  PLT 114* 119* 126* 152 157    LFT Recent Labs  Lab  07/07/24 1634 07/09/24 0303  AST 39 26  ALT 12 7  ALKPHOS 112 105  BILITOT 0.8 0.5  PROT 5.8* 5.2*  ALBUMIN 2.4* 2.2*     Antibiotics: Anti-infectives (From admission, onward)    Start     Dose/Rate Route Frequency Ordered Stop   07/08/24 1800  cefTRIAXone  (ROCEPHIN ) 1 g in sodium chloride  0.9 % 100 mL IVPB        1 g 200 mL/hr over 30 Minutes Intravenous Every 24 hours 07/08/24 1153     07/07/24 1915  cefTRIAXone  (ROCEPHIN ) 1 g in sodium chloride  0.9 % 100 mL IVPB        1 g 200 mL/hr over 30 Minutes Intravenous  Once 07/07/24 1905 07/07/24 2006        DVT prophylaxis: Lovenox   Code Status: DNR  Family Communication: Discussed with patient's daughter at bedside   CONSULTS    Subjective   Continues to Complain of lower abdominal  pain   Objective    Physical Examination:    General: Appearance:    Thin male in no acute distress     Lungs:     respirations unlabored  Heart:    Normal heart rate. Normal rhythm. No murmurs, rubs, or gallops.   MS:   All extremities are intact.   Neurologic:   Awake, alert,      Status is: Inpatient:             [name] Bowl   [facility] If 7PM-7AM, please contact night-coverage at [email or web address], Office  [phone]   07/10/2024, 12:01 PM  LOS: 2 days

## 2024-07-10 NOTE — Consult Note (Signed)
 Subjective: 1. Urinary tract infection without hematuria, site unspecified      Consult requested by Dr. Harlene Bowl  Jeremiah Zimmerman is a longstanding patient of mine with a history of BPH with BOO on tamsulosin , recurrent UTI's and a remote history of bladder cancer.  He has had prior prostatitis with urinary retention and was seen by Dr. Onita last week on Tues with symptoms of a UTI.  He was cultured and started on Cipro .  He subsequently developed urinary retention requiring a foley in the ER on Weds, 8/20 and was discharged home but now is admitted with fever to 104 per his daughter.  We was switched to ceftriaxone  from the cipro  today.   His urine culture grew e. Coli sensitve to cipro  and cephalosporins.  He has a normal Cr and no leukocytosis.  CT shows a very large prostate with a foley in good position and some penile edema.  ROS:  Review of Systems  Constitutional:  Positive for chills and fever.  Gastrointestinal:  Positive for abdominal pain.  Genitourinary:  Positive for dysuria.  All other systems reviewed and are negative.   Allergies  Allergen Reactions   Erythromycin Nausea Only and Other (See Comments)    REACTION: Intolerance to this medication with upset stomach (also)   Nitroglycerin Other (See Comments)    REACTION: Intolerance to this medication.  It drops his BP too low and quickly. (Also)   Sulfa Antibiotics Nausea Only and Rash   Tetracyclines & Related Nausea Only   Amoxicillin Diarrhea and Nausea Only   Penicillins Swelling and Rash    Has patient had a PCN reaction causing immediate rash, facial/tongue/throat swelling, SOB or lightheadedness with hypotension: Yes Has patient had a PCN reaction causing severe rash involving mucus membranes or skin necrosis: No Has patient had a PCN reaction that required hospitalization: No Has patient had a PCN reaction occurring within the last 10 years: No If all of the above answers are NO, then may proceed with  Cephalosporin use.   REACTION: Patient develops a rash and swelling at inject    Past Medical History:  Diagnosis Date   Adenomatous colon polyp    Bladder cancer (HCC) 2010   BPH (benign prostatic hyperplasia)    Heart murmur    Hyperlipidemia    Hypertension    MVP (mitral valve prolapse) 01-13-13   hx. LBBB previous EKG 11'11  in Epic.   Parkinson's disease (HCC)    SBO (small bowel obstruction) (HCC) 07/18/2018   Seasonal allergies 01-13-13    Past Surgical History:  Procedure Laterality Date   APPENDECTOMY     BLADDER SURGERY  2010   bladder cancer in 2010/Post surgery bleed   CARDIAC CATHETERIZATION  01-13-13   greater than 5 yrs ago-negative findings   CHOLECYSTECTOMY N/A 01/27/2017   Procedure: LAPAROSCOPIC CHOLECYSTECTOMY WITH INTRAOPERATIVE CHOLANGIOGRAM;  Surgeon: Donnice Bury, MD;  Location: [facility] OR;  Service: General;  Laterality: N/A;   COLONOSCOPY  01-13-13   hx.colon polyps excised-last 3-4 yrs ago   ELBOW SURGERY     left   KNEE ARTHROSCOPY WITH MEDIAL MENISECTOMY Right 01/20/2013   Procedure: KNEE ARTHROSCOPY WITH MEDIAL MENISECTOMY;  Surgeon: Dempsey LULLA Moan, MD;  Location: [facility] ORS;  Service: Orthopedics;  Laterality: Right;  right knee arthroscopy with medial meniscal debridement and chrondroplasty   TONSILLECTOMY     TONSILLECTOMY AND ADENOIDECTOMY      Social History   Socioeconomic History   Marital status: Married    Spouse  name: Not on file   Number of children: Not on file   Years of education: Not on file   Highest education level: Not on file  Occupational History   Not on file  Tobacco Use   Smoking status: Never   Smokeless tobacco: Never  Vaping Use   Vaping status: Never Used  Substance and Sexual Activity   Alcohol use: No   Drug use: No   Sexual activity: Not Currently  Other Topics Concern   Not on file  Social History Narrative   Right Handed    Lives in one story home with wife    Social Drivers of Health   Financial  Resource Strain: Not on file  Food Insecurity: No Food Insecurity (07/08/2024)   Hunger Vital Sign    Worried About Running Out of Food in the Last Year: Never true    Ran Out of Food in the Last Year: Never true  Transportation Needs: No Transportation Needs (07/08/2024)   PRAPARE - Administrator, Civil Service (Medical): No    Lack of Transportation (Non-Medical): No  Physical Activity: Not on file  Stress: Not on file  Social Connections: Moderately Isolated (07/08/2024)   Social Connection and Isolation Panel    Frequency of Communication with Friends and Family: Twice a week    Frequency of Social Gatherings with Friends and Family: Three times a week    Attends Religious Services: Patient unable to answer    Active Member of Clubs or Organizations: No    Attends Club or Organization Meetings: Never    Marital Status: Married  Catering manager Violence: Not At Risk (07/08/2024)   Humiliation, Afraid, Rape, and Kick questionnaire    Fear of Current or Ex-Partner: No    Emotionally Abused: No    Physically Abused: No    Sexually Abused: No    Family History  Problem Relation Age of Onset   Breast cancer Mother    Prostate cancer Father    Colon cancer Brother    Cervical cancer Daughter    Thyroid cancer Daughter    Uterine cancer Daughter    Prostate cancer Brother    Alzheimer's disease Sister    Alzheimer's disease Sister    Healthy Son     Anti-infectives: Anti-infectives (From admission, onward)    Start     Dose/Rate Route Frequency Ordered Stop   07/08/24 1800  cefTRIAXone  (ROCEPHIN ) 1 g in sodium chloride  0.9 % 100 mL IVPB        1 g 200 mL/hr over 30 Minutes Intravenous Every 24 hours 07/08/24 1153     07/07/24 1915  cefTRIAXone  (ROCEPHIN ) 1 g in sodium chloride  0.9 % 100 mL IVPB        1 g 200 mL/hr over 30 Minutes Intravenous  Once 07/07/24 1905 07/07/24 2006       Current Facility-Administered Medications  Medication Dose Route Frequency  Provider Last Rate Last Admin   acetaminophen  (TYLENOL ) 160 MG/5ML solution 650 mg  650 mg Oral Q6H PRN Ellington, Abby K, RPH   650 mg at 07/10/24 1717   Or   acetaminophen  (TYLENOL ) suppository 650 mg  650 mg Rectal Q6H PRN Ellington, Abby K, RPH       bisacodyl  (DULCOLAX) EC tablet 5 mg  5 mg Oral Daily PRN Amponsah, Prosper M, MD       Carbidopa -Levodopa  ER (SINEMET  CR) 25-100 MG tablet controlled release 2 tablet  2 tablet Oral TID Lou Mill  M, MD   2 tablet at 07/10/24 1610   cefTRIAXone  (ROCEPHIN ) 1 g in sodium chloride  0.9 % 100 mL IVPB  1 g Intravenous Q24H Lama, Gagan S, MD 200 mL/hr at 07/09/24 1702 1 g at 07/09/24 1702   Chlorhexidine  Gluconate Cloth 2 % PADS 6 each  6 each Topical Daily Drusilla Sabas RAMAN, MD   6 each at 07/10/24 9078   enoxaparin  (LOVENOX ) injection 40 mg  40 mg Subcutaneous QHS Amponsah, Prosper M, MD   40 mg at 07/09/24 2150   ondansetron  (ZOFRAN ) tablet 4 mg  4 mg Oral Q6H PRN Amponsah, Prosper M, MD       Or   ondansetron  (ZOFRAN ) injection 4 mg  4 mg Intravenous Q6H PRN Lou Claretta HERO, MD       senna-docusate (Senokot-S) tablet 1 tablet  1 tablet Oral QHS PRN Lou Claretta HERO, MD       tamsulosin  (FLOMAX ) capsule 0.4 mg  0.4 mg Oral Daily Lou Claretta HERO, MD   0.4 mg at 07/10/24 9080     Objective: Vital signs in last 24 hours: BP 124/63 (BP Location: Right Arm)   Pulse 82   Temp 97.8 F (36.6 C)   Resp 18   Ht 5' 10 (1.778 m)   Wt 61.2 kg   SpO2 99%   BMI 19.37 kg/m   Intake/Output from previous day: 08/24 0701 - 08/25 0700 In: 229.4 [I.V.:229.4] Out: 2325 [Urine:2325] Intake/Output this shift: Total I/O In: 120 [P.O.:120] Out: 1050 [Urine:1050]   Physical Exam Vitals reviewed.  Constitutional:      Appearance: Normal appearance.  Cardiovascular:     Rate and Rhythm: Normal rate and regular rhythm.  Pulmonary:     Effort: Pulmonary effort is normal. No respiratory distress.  Abdominal:     General: Abdomen is flat.      Palpations: Abdomen is soft.     Tenderness: There is abdominal tenderness (suprapubic area.). There is no right CVA tenderness or left CVA tenderness.  Genitourinary:    Comments: Uncircumcised phallus with paraphimosis and penile edema with a foley at the meatus.   Scrotum, testes and epididymis unremarkable.  Musculoskeletal:        General: No swelling. Normal range of motion.  Skin:    General: Skin is warm and dry.  Neurological:     General: No focal deficit present.     Mental Status: He is alert and oriented to person, place, and time.     Lab Results:  Results for orders placed or performed during the hospital encounter of 07/07/24 (from the past 24 hours)  CBC     Status: Abnormal   Collection Time: 07/10/24  3:26 AM  Result Value Ref Range   WBC 8.3 4.0 - 10.5 K/uL   RBC 3.88 (L) 4.22 - 5.81 MIL/uL   Hemoglobin 11.6 (L) 13.0 - 17.0 g/dL   HCT 62.5 (L) 60.9 - 47.9 %   MCV 96.4 80.0 - 100.0 fL   MCH 29.9 26.0 - 34.0 pg   MCHC 31.0 30.0 - 36.0 g/dL   RDW 87.4 88.4 - 84.4 %   Platelets 157 150 - 400 K/uL   nRBC 0.0 0.0 - 0.2 %  Basic metabolic panel     Status: Abnormal   Collection Time: 07/10/24  3:26 AM  Result Value Ref Range   Sodium 138 135 - 145 mmol/L   Potassium 3.9 3.5 - 5.1 mmol/L   Chloride 107 98 - 111 mmol/L  CO2 24 22 - 32 mmol/L   Glucose, Bld 100 (H) 70 - 99 mg/dL   BUN 11 8 - 23 mg/dL   Creatinine, Ser 9.38 0.61 - 1.24 mg/dL   Calcium 8.1 (L) 8.9 - 10.3 mg/dL   GFR, Estimated >39 >39 mL/min   Anion gap 7 5 - 15    BMET Recent Labs    07/09/24 0303 07/10/24 0326  NA 136 138  K 3.7 3.9  CL 105 107  CO2 25 24  GLUCOSE 125* 100*  BUN 13 11  CREATININE 0.57* 0.61  CALCIUM 8.1* 8.1*   PT/INR No results for input(s): LABPROT, INR in the last 72 hours. ABG No results for input(s): PHART, HCO3 in the last 72 hours.  Invalid input(s): PCO2, PO2  Studies/Results: No results found. CT ABDOMEN PELVIS W CONTRAST Result  Date: 07/07/2024 CLINICAL DATA:  Acute abdominal pain.  Fever. EXAM: CT ABDOMEN AND PELVIS WITH CONTRAST TECHNIQUE: Multidetector CT imaging of the abdomen and pelvis was performed using the standard protocol following bolus administration of intravenous contrast. RADIATION DOSE REDUCTION: This exam was performed according to the departmental dose-optimization program which includes automated exposure control, adjustment of the mA and/or kV according to patient size and/or use of iterative reconstruction technique. CONTRAST:  OMNIPAQUE  IOHEXOL  300 MG/ML  SOLN COMPARISON:  04/20/2019 FINDINGS: Lower Chest: No acute findings. Hepatobiliary: No suspicious hepatic masses identified. Prior cholecystectomy. No evidence of biliary obstruction. Pancreas:  No mass or inflammatory changes. Spleen: Within normal limits in size and appearance. Adrenals/Urinary Tract: No suspicious masses identified. Bilateral benign-appearing renal cysts again noted (No followup imaging is recommended). No evidence of ureteral calculi or hydronephrosis. Foley catheter seen in the bladder. Diffuse bladder wall thickening is seen, presumably due to chronic bladder outlet obstruction given enlarged prostate. Stomach/Bowel: No evidence of obstruction, inflammatory process or abnormal fluid collections. Vascular/Lymphatic: No pathologically enlarged lymph nodes. No acute vascular findings. Reproductive:  Stable markedly enlarged prostate. Other:  None. Musculoskeletal:  No suspicious bone lesions identified. IMPRESSION: No acute findings. Stable markedly enlarged prostate, and findings of chronic bladder outlet obstruction. Foley catheter within bladder. Electronically Signed   By: Norleen DELENA Kil M.D.   On: 07/07/2024 18:46   DG Chest Portable 1 View Result Date: 07/07/2024 CLINICAL DATA:  weakness EXAM: PORTABLE CHEST - 1 VIEW COMPARISON:  Apr 05, 2024 FINDINGS: No focal airspace consolidation, pleural effusion, or pneumothorax. No  cardiomegaly. Aortic atherosclerosis. No acute fracture or destructive lesions. Multilevel thoracic osteophytosis. IMPRESSION: No acute cardiopulmonary abnormality. Electronically Signed   By: Rogelia Myers M.D.   On: 07/07/2024 17:12     Assessment/Plan: Febrile UTI with possible acute prostatitis and urinary retention.   Continue foley drainage and IV antibiotics.  He has f/u scheduled next week for a voiding trial.  Paraphimosis.   I reduced the foreskin and instructed the nurse to keep it reduced.         No follow-ups on file.    CC: Dr. Harlene Bowl.      Sonika Levins 07/10/2024

## 2024-07-10 NOTE — Plan of Care (Signed)
 Care RN noted swelling on the underside of the penis below the head of the penis. Also, during foley care noted tan milky scant amount of discharge from penis.    Problem: Education: Goal: Knowledge of General Education information will improve Description: Including pain rating scale, medication(s)/side effects and non-pharmacologic comfort measures Outcome: Progressing   Problem: Health Behavior/Discharge Planning: Goal: Ability to manage health-related needs will improve Outcome: Progressing   Problem: Clinical Measurements: Goal: Ability to maintain clinical measurements within normal limits will improve Outcome: Progressing Goal: Will remain free from infection Outcome: Progressing Goal: Diagnostic test results will improve Outcome: Progressing Goal: Respiratory complications will improve Outcome: Progressing Goal: Cardiovascular complication will be avoided Outcome: Progressing   Problem: Activity: Goal: Risk for activity intolerance will decrease Outcome: Progressing   Problem: Nutrition: Goal: Adequate nutrition will be maintained Outcome: Progressing   Problem: Coping: Goal: Level of anxiety will decrease Outcome: Progressing   Problem: Elimination: Goal: Will not experience complications related to bowel motility Outcome: Progressing Goal: Will not experience complications related to urinary retention Outcome: Progressing   Problem: Pain Managment: Goal: General experience of comfort will improve and/or be controlled Outcome: Progressing   Problem: Safety: Goal: Ability to remain free from injury will improve Outcome: Progressing   Problem: Skin Integrity: Goal: Risk for impaired skin integrity will decrease Outcome: Progressing

## 2024-07-10 NOTE — Progress Notes (Signed)
 Physical Therapy Treatment Patient Details Name: Jeremiah Zimmerman MRN: [id number] DOB: 1934-06-24 Today's Date: 07/10/2024   History of Present Illness Jeremiah Zimmerman is a 89 y.o. male admitted 07/07/24 with medical history significant for Parkinson's disease, bladder cancer, BPH, HTN, HLD and recent urinary retention s/p Foley placement on 8/20 now presenting with persistent fevers.    PT Comments  Pt making steady progress; tolerating incr distance this session however continues to report fatigue with activity in relation to baseline. Pt will benefit from HHPT at d/c, continue PT POC    If plan is discharge home, recommend the following: A little help with walking and/or transfers;A little help with bathing/dressing/bathroom;Assistance with cooking/housework;Help with stairs or ramp for entrance;Assist for transportation   Can travel by private vehicle        Equipment Recommendations  Rolling walker (2 wheels)    Recommendations for Other Services       Precautions / Restrictions Precautions Precautions: Fall Recall of Precautions/Restrictions: Intact Restrictions Weight Bearing Restrictions Per Provider Order: No     Mobility  Bed Mobility Overal bed mobility: Needs Assistance Bed Mobility: Rolling, Sidelying to Sit Rolling: Contact guard assist Sidelying to sit: Min assist       General bed mobility comments: assist with trunk elevation, incr time needed    Transfers Overall transfer level: Needs assistance Equipment used: Rolling walker (2 wheels) Transfers: Sit to/from Stand, Bed to chair/wheelchair/BSC Sit to Stand: Contact guard assist, Min assist   Step pivot transfers: Contact guard assist       General transfer comment: Pt able to complete STS with light min assist and step pivot bed<>chair at CGA with RW, requires inc time (bowel urgency)    Ambulation/Gait Ambulation/Gait assistance: Contact guard assist Gait Distance (Feet): 80 Feet Assistive  device: Rolling walker (2 wheels) Gait Pattern/deviations: Step-through pattern, Decreased stride length, Narrow base of support Gait velocity: decr     General Gait Details: pt fatigues easily, incr time needed, verbal cues for RW safety with direction changes, to maintain safe position of RW in relation to self -especially with turns   Optometrist     Tilt Bed    Modified Rankin (Stroke Patients Only)       Balance Overall balance assessment: Needs assistance Sitting-balance support: Feet supported, No upper extremity supported Sitting balance-Leahy Scale: Fair     Standing balance support: During functional activity, Reliant on assistive device for balance Standing balance-Leahy Scale: Poor                              Communication Communication Communication: No apparent difficulties  Cognition Arousal: Alert Behavior During Therapy: WFL for tasks assessed/performed, Flat affect   PT - Cognitive impairments: No apparent impairments                         Following commands: Intact      Cueing Cueing Techniques: Verbal cues, Gestural cues  Exercises General Exercises - Lower Extremity Heel Slides: AROM, Both, 5 reps    General Comments        Pertinent Vitals/Pain Pain Assessment Pain Assessment: No/denies pain    Home Living                          Prior Function  PT Goals (current goals can now be found in the care plan section) Acute Rehab PT Goals Patient Stated Goal: restore strength and endurance PT Goal Formulation: With patient Time For Goal Achievement: 07/23/24 Potential to Achieve Goals: Good Progress towards PT goals: Progressing toward goals    Frequency    Min 3X/week      PT Plan      Co-evaluation              AM-PAC PT 6 Clicks Mobility   Outcome Measure  Help needed turning from your back to your side while in a flat bed  without using bedrails?: A Little Help needed moving from lying on your back to sitting on the side of a flat bed without using bedrails?: A Little Help needed moving to and from a bed to a chair (including a wheelchair)?: A Little Help needed standing up from a chair using your arms (e.g., wheelchair or bedside chair)?: A Little Help needed to walk in hospital room?: A Little Help needed climbing 3-5 steps with a railing? : A Little 6 Click Score: 18    End of Session Equipment Utilized During Treatment: Gait belt Activity Tolerance: Patient tolerated treatment well Patient left: with call bell/phone within reach;Other (comment);with family/visitor present [facility]) Nurse Communication: Mobility status PT Visit Diagnosis: Muscle weakness (generalized) (M62.81);Unsteadiness on feet (R26.81);Difficulty in walking, not elsewhere classified (R26.2);Other symptoms and signs involving the nervous system (R29.898)     Time: 1024-1040 PT Time Calculation (min) (ACUTE ONLY): 16 min  Charges:    $Gait Training: 8-22 mins PT General Charges $$ ACUTE PT VISIT: 1 Visit                     Logyn Kendrick, PT  Acute Rehab Dept [facility]) [phone]  07/10/2024    [facility] 07/10/2024, 10:53 AM

## 2024-07-10 NOTE — TOC Initial Note (Addendum)
 Transition of Care [facility]) - Initial/Assessment Note    Patient Details  Name: Jeremiah Zimmerman MRN: [id number] Date of Birth: 01-May-1934  Transition of [facility]) CM/SW Contact:    Alfonse JONELLE Rex, RN Phone Number: 07/10/2024, 10:56 AM  Clinical Narrative:   Met with patient and patient's dtr, Orie, to introduce role of TOC/NCM and review for dc planning,  [name] answered assessment questions, reports patient has an established PCP, he resides with his spouse in a private residence, [name] states she and her other family member assist with meals, house  chores and transportation to doctors appointments, home DME: wheelchair, step in shower, rollator. PT recommendation for [facility] PT/OT, [name] agreeable, no preference. [facility], rep-Amy accepted for [facility] PT/OT, 2W RW- [facility], rep-Mitch accepted, added to AVS. [name] requested Palliative Medicine Team consult, request sent to attending to enter order.  TOC will continue to follow       -11:45am Met with pt's dtr , [name], confirmed patient has 2W RW at home. Mitch w/[facility] notified to cancel request for RW.        -3:00pm NCM provided patient's sister Private Duty Care Agencies per her request.        Expected Discharge Plan: Home w Home Health Services Barriers to Discharge: Continued Medical Work up   Patient Goals and CMS Choice Patient states their goals for this hospitalization and ongoing recovery are:: return home with home health services CMS Medicare.gov Compare Post Acute Care list provided to:: Patient Represenative (must comment) (Spillman,[name] (Daughter)  [phone] (Mobile)) Choice offered to / list presented to : Adult Children [place] ownership interest in [facility].provided to:: Adult Children    Expected Discharge Plan and Services     Post Acute Care Choice: Home Health Living arrangements for the past 2 months: Single Family Home                 DME Arranged: Walker rolling DME Agency:  [facility] Date DME Agency Contacted: 07/10/24 Time DME Agency Contacted: 1056 Representative spoke with at DME Agency: Mitch HH Arranged: PT, OT          Prior Living Arrangements/Services Living arrangements for the past 2 months: Single Family Home Lives with:: Spouse Patient language and need for interpreter reviewed:: Yes Do you feel safe going back to the place where you live?: Yes      Need for Family Participation in Patient Care: Yes (Comment) Care giver support system in place?: Yes (comment) Current home services: DME (Rollator, walk in shower, wheelchair) Criminal Activity/Legal Involvement Pertinent to Current Situation/Hospitalization: No - Comment as needed  Activities of Daily Living   ADL Screening (condition at time of admission) Independently performs ADLs?: Yes (appropriate for developmental age) Is the patient deaf or have difficulty hearing?: No Does the patient have difficulty seeing, even when wearing glasses/contacts?: No Does the patient have difficulty concentrating, remembering, or making decisions?: Yes  Permission Sought/Granted                  Emotional Assessment Appearance:: Appears stated age Attitude/Demeanor/Rapport: Unable to Assess Affect (typically observed): Unable to Assess Orientation: : Oriented to Self Alcohol / Substance Use: Not Applicable Psych Involvement: No (comment)  Admission diagnosis:  UTI (urinary tract infection) [N39.0] Urinary tract infection without hematuria, site unspecified [N39.0] Patient Active Problem List   Diagnosis Date Noted   Generalized weakness 07/07/2024   Acute urinary retention 07/07/2024   Benign prostatic hyperplasia with lower urinary tract symptoms 07/07/2024  Acute lower UTI 04/19/2019   UTI (urinary tract infection) 04/19/2019   Sepsis (HCC) 04/18/2019   Sepsis due to gram-negative UTI (HCC) 04/17/2019   Parkinson's disease (HCC) 04/17/2019   Small bowel obstruction (HCC)  07/18/2018   Right upper quadrant abdominal pain 01/25/2017   Elevated LFTs 01/25/2017   Acute medial meniscal tear 01/19/2013   MUSCLE PAIN 10/15/2010   OTHER MALAISE AND FATIGUE 10/15/2010   DYSPNEA 10/15/2010   Malignant neoplasm of bladder (HCC) 10/14/2010   Dyslipidemia 10/14/2010   Essential hypertension 10/14/2010   BUNDLE BRANCH BLOCK, LEFT 10/14/2010   GROSS HEMATURIA 10/14/2010   PCP:  Onita Rush, MD Pharmacy:   [facility] [phone] [address], [place] - [address] [place] [address] Phone: [phone] Fax: [phone]     Social Drivers of Health (SDOH) Social History: SDOH Screenings   Food Insecurity: No Food Insecurity (07/08/2024)  Housing: Low Risk  (07/08/2024)  Transportation Needs: No Transportation Needs (07/08/2024)  Utilities: Not At Risk (07/08/2024)  Social Connections: Moderately Isolated (07/08/2024)  Tobacco Use: Low Risk  (07/07/2024)   SDOH Interventions:     Readmission Risk Interventions    07/10/2024   10:53 AM  Readmission Risk Prevention Plan  Transportation Screening Complete  PCP or Specialist Appt within 5-7 Days Complete  Home Care Screening Complete  Medication Review (RN CM) Complete

## 2024-07-10 NOTE — Plan of Care (Signed)
  Problem: Activity: Goal: Risk for activity intolerance will decrease Outcome: Progressing   Problem: Nutrition: Goal: Adequate nutrition will be maintained Outcome: Progressing   Problem: Coping: Goal: Level of anxiety will decrease Outcome: Progressing   Problem: Pain Managment: Goal: General experience of comfort will improve and/or be controlled Outcome: Progressing   Problem: Safety: Goal: Ability to remain free from injury will improve Outcome: Progressing

## 2024-07-10 NOTE — Progress Notes (Signed)
 MD notified about the swelling around the head of penis and brown yellowish drainage from the foley site. Foley care provided and barrier cream applied. PRN pain med given for soreness and discomfort on the penis area. Pt verbalized pain med being effective. Will continue to monitor.

## 2024-07-11 DIAGNOSIS — R531 Weakness: Secondary | ICD-10-CM | POA: Diagnosis not present

## 2024-07-11 DIAGNOSIS — N390 Urinary tract infection, site not specified: Secondary | ICD-10-CM | POA: Diagnosis not present

## 2024-07-11 DIAGNOSIS — Z7189 Other specified counseling: Secondary | ICD-10-CM | POA: Diagnosis not present

## 2024-07-11 DIAGNOSIS — Z515 Encounter for palliative care: Secondary | ICD-10-CM

## 2024-07-11 DIAGNOSIS — G20B1 Parkinson's disease with dyskinesia, without mention of fluctuations: Secondary | ICD-10-CM

## 2024-07-11 LAB — BASIC METABOLIC PANEL WITH GFR
Anion gap: 7 (ref 5–15)
BUN: 12 mg/dL (ref 8–23)
CO2: 25 mmol/L (ref 22–32)
Calcium: 8.2 mg/dL — ABNORMAL LOW (ref 8.9–10.3)
Chloride: 105 mmol/L (ref 98–111)
Creatinine, Ser: 0.62 mg/dL (ref 0.61–1.24)
GFR, Estimated: >60 mL/min (ref >60)
Glucose, Bld: 104 mg/dL — ABNORMAL HIGH (ref 70–99)
Potassium: 4 mmol/L (ref 3.5–5.1)
Sodium: 137 mmol/L (ref 135–145)

## 2024-07-11 LAB — CBC
HCT: 36.3 % — ABNORMAL LOW (ref 39.0–52.0)
Hemoglobin: 11.7 g/dL — ABNORMAL LOW (ref 13.0–17.0)
MCH: 30.8 pg (ref 26.0–34.0)
MCHC: 32.2 g/dL (ref 30.0–36.0)
MCV: 95.5 fL (ref 80.0–100.0)
Platelets: 157 K/uL (ref 150–400)
RBC: 3.8 MIL/uL — ABNORMAL LOW (ref 4.22–5.81)
RDW: 12.3 % (ref 11.5–15.5)
WBC: 8.9 K/uL (ref 4.0–10.5)
nRBC: 0 % (ref 0.0–0.2)

## 2024-07-11 MED ORDER — PHENAZOPYRIDINE HCL 100 MG PO TABS
100.0000 mg | ORAL_TABLET | Freq: Once | ORAL | Status: AC
  Administered 2024-07-11: 100 mg via ORAL
  Filled 2024-07-11: qty 1

## 2024-07-11 MED ORDER — CEFADROXIL 500 MG PO CAPS
500.0000 mg | ORAL_CAPSULE | Freq: Two times a day (BID) | ORAL | Status: DC
  Administered 2024-07-11 – 2024-07-12 (×3): 500 mg via ORAL
  Filled 2024-07-11 (×3): qty 1

## 2024-07-11 NOTE — Progress Notes (Signed)
 He is doing well today with reduced abdominal pain and no fever.   He is tolerating the foley.  BP (!) 144/69 (BP Location: Right Arm)   Pulse 75   Temp 97.8 F (36.6 C) (Oral)   Resp 18   Ht 5' 10 (1.778 m)   Wt 61.2 kg   SpO2 97%   BMI 19.37 kg/m   Gen: WD,WN in NAD GI: Soft, NT GU: Foley in good position.  Foreskin reduced and edema resolved.  Labs reviewed..  Imp: 1. Febrile UTI/prostatitis with retention doing well on antibiotics.  He has f/u next week as outpatient for a voiding trial.  2. Paraphimosis.   Foreskin remain reduced.

## 2024-07-11 NOTE — TOC Progression Note (Signed)
 Transition of Care [facility]) - Progression Note    Patient Details  Name: NEWTON FRUTIGER MRN: [id number] Date of Birth: 01/10/1934  Transition of Care [facility]) CM/SW Contact  Alfonse JONELLE Rex, RN Phone Number: 07/11/2024, 3:29 PM  Clinical Narrative:  [facility] consult for home palliative services. Met with daughter at bedside, provided list of Home Palliative Providers, daughter states she will review list her sister and have decision for NCM in am.      Expected Discharge Plan: Home w Home Health Services Barriers to Discharge: Continued Medical Work up               Expected Discharge Plan and Services     Post Acute Care Choice: Home Health Living arrangements for the past 2 months: Single Family Home                 DME Arranged: Walker rolling DME Agency: [employer] Date DME Agency Contacted: 07/10/24 Time DME Agency Contacted: 1056 Representative spoke with at DME Agency: [facility] Arranged: PT, OT           Social Drivers of Health (SDOH) Interventions SDOH Screenings   Food Insecurity: No Food Insecurity (07/08/2024)  Housing: Low Risk  (07/08/2024)  Transportation Needs: No Transportation Needs (07/08/2024)  Utilities: Not At Risk (07/08/2024)  Social Connections: Moderately Isolated (07/08/2024)  Tobacco Use: Low Risk  (07/07/2024)    Readmission Risk Interventions    07/10/2024   10:53 AM  Readmission Risk Prevention Plan  Transportation Screening Complete  PCP or Specialist Appt within 5-7 Days Complete  Home Care Screening Complete  Medication Review (RN CM) Complete

## 2024-07-11 NOTE — Progress Notes (Signed)
 OT Cancellation Note  Patient Details Name: Jeremiah Zimmerman MRN: [id number] DOB: May 24, 1934   Cancelled Treatment:    Reason Eval/Treat Not Completed: Fatigue/lethargy limiting ability to participate. Daughter present with pt asleep in room. Reports pt is extremely fatigued after working with PT earlier today. Plan to discharge tomorrow with [facility] services. OT will check back as able.  Basheer Molchan L. Willie Plain, OTR/L  07/11/24, 2:54 PM

## 2024-07-11 NOTE — Progress Notes (Addendum)
 [facility]  PROGRESS NOTE  Jeremiah Zimmerman FMW:[id number] DOB: 11/26/1933 DOA: 07/07/2024 PCP: Onita Rush, MD   Brief HPI:    89 y.o. male with medical history significant for Parkinson's disease, bladder cancer, BPH, HTN, HLD and recent urinary retention s/p Foley placement on 8/20 now presenting with persistent fevers.  Per daughter, patient started having fever last Sunday up to 102.3.  He was evaluated by PCP on Tuesday, diagnosed with UTI and prescribed Cipro . On Wednesday, he presented to the ED due to urinary retention, Foley catheter was placed and ultimately discharged to follow-up with urology.  Continue to have fevers to return to the hospital.  Slowly improved and suspect he can go home on 8/27 if afebrile.    Assessment/Plan:   # Acute cystitis - Pt with recent urinary retention s/p foley placement presented with persistent fevers and dysuria - Failed outpatient treatment with ciprofloxacin ; patient had been on ciprofloxacin  for 3 days before coming to the hospital - Urinalysis shows persistent signs of infection but improving-- exam still concerning for pain with palpation in lower quadrants -  IV rocephin -changed to oral antibiotics and monitor for fever overnight prior to discharge - culture from [facility]:       # Urinary retention # BPH - Patient found to have urinary retention at ED visit on 8/20 now status post Foley placement - He has follow-up scheduled with urology for trial of void in 2 weeks - CT A/P shows stable markedly enlarged prostate and Foley catheter within bladder - Continue Foley catheter - Continue tamsulosin  -asked urology to see--reduced his foreskin/paraphimosis   # Parkinson's disease - Continue home carbidopa  levodopa    # Generalized weakness - In the setting of acute illness - PT/OT eval and treat- home health   Family asking for palliative care eval while in hospital        Medications     Carbidopa -Levodopa  ER  2 tablet Oral  TID   cefadroxil   500 mg Oral BID   Chlorhexidine  Gluconate Cloth  6 each Topical Daily   enoxaparin  (LOVENOX ) injection  40 mg Subcutaneous QHS   tamsulosin   0.4 mg Oral Daily     Data Reviewed:   CBG:  No results for input(s): GLUCAP in the last 168 hours.  SpO2: 97 %    Vitals:   07/10/24 0846 07/10/24 1353 07/10/24 2038 07/11/24 0540  BP: (!) 146/68 124/63 122/61 (!) 144/69  Pulse: 84 82 79 75  Resp: 14 18 17 18   Temp: 99.3 F (37.4 C) 97.8 F (36.6 C) 97.6 F (36.4 C) 97.8 F (36.6 C)  TempSrc:   Oral Oral  SpO2: 97% 99% 97% 97%  Weight:      Height:          Data Reviewed:  Basic Metabolic Panel: Recent Labs  Lab 07/07/24 1634 07/08/24 0354 07/09/24 0303 07/10/24 0326 07/11/24 0327  NA 135 137 136 138 137  K 3.5 3.5 3.7 3.9 4.0  CL 102 105 105 107 105  CO2 24 25 25 24 25   GLUCOSE 110* 120* 125* 100* 104*  BUN 13 12 13 11 12   CREATININE 0.76 0.75 0.57* 0.61 0.62  CALCIUM 8.4* 8.2* 8.1* 8.1* 8.2*    CBC: Recent Labs  Lab 07/07/24 1634 07/08/24 0354 07/09/24 0303 07/10/24 0326 07/11/24 0327  WBC 7.6 7.7 7.7 8.3 8.9  NEUTROABS 5.2  --   --   --   --   HGB 11.8* 11.3* 11.5* 11.6* 11.7*  HCT  37.7* 36.4* 36.3* 37.4* 36.3*  MCV 94.3 95.8 96.3 96.4 95.5  PLT 119* 126* 152 157 157    LFT Recent Labs  Lab 07/07/24 1634 07/09/24 0303  AST 39 26  ALT 12 7  ALKPHOS 112 105  BILITOT 0.8 0.5  PROT 5.8* 5.2*  ALBUMIN 2.4* 2.2*     Antibiotics: Anti-infectives (From admission, onward)    Start     Dose/Rate Route Frequency Ordered Stop   07/11/24 1145  cefadroxil  (DURICEF) capsule 500 mg        500 mg Oral 2 times daily 07/11/24 1049     07/08/24 1800  cefTRIAXone  (ROCEPHIN ) 1 g in sodium chloride  0.9 % 100 mL IVPB  Status:  Discontinued        1 g 200 mL/hr over 30 Minutes Intravenous Every 24 hours 07/08/24 1153 07/11/24 1049   07/07/24 1915  cefTRIAXone  (ROCEPHIN ) 1 g in sodium chloride  0.9 % 100 mL IVPB        1 g 200 mL/hr over  30 Minutes Intravenous  Once 07/07/24 1905 07/07/24 2006        DVT prophylaxis: Lovenox   Code Status: DNR  Family Communication: Discussed with patient's daughter at bedside   CONSULTS    Subjective   Lower abdominal pain is now starting to feel better   Objective    Physical Examination:    General: Appearance:    Thin male in no acute distress     Lungs:     respirations unlabored  Heart:    Normal heart rate.   MS:   All extremities are intact.   Neurologic:   Awake, alert, pleasant and cooperative      Status is: Inpatient:             Jeremiah Zimmerman   [facility] If 7PM-7AM, please contact night-coverage at [email or web address], Office  [phone]   07/11/2024, 10:55 AM  LOS: 2 days

## 2024-07-11 NOTE — Consult Note (Signed)
 Consultation Note Date: 07/11/2024   Patient Name: Jeremiah Zimmerman  DOB: 1934/03/05  MRN: [id number]  Age / Sex: 89 y.o., male   PCP: Onita Rush, MD Referring Physician: Juvenal Harlene PENNER, DO  Reason for Consultation: Establishing goals of care     Chief Complaint/History of Present Illness:   Patient is an 89 year old male with a past medical history of Parkinson's disease, remote history of bladder cancer, BPH, hypertension, hyperlipidemia, and recent urinary retention status post Foley placement 07/05/2024 who was admitted on 07/07/2024 with persistent fevers.  Patient had been receiving antibiotics for urinary tract infection prior to admission.  Upon admission, patient received further management with adjustment of antibiotics for acute cystitis.  Urology consulted for recommendations.  Palliative medicine team consulted for complex medical decision making at family's request.  Extensive review of EMR including recent documentation from hospitalist, urologist, and PT/OT.  Patient noted to be improving with antibiotics, hopeful patient may be able to be discharged 07/11/2024.  Patient will continue to have Foley in place and will follow-up with urology in the outpatient setting for a voiding trial.  Physical therapy and Occupational Therapy have been working with patient to regain strength.  Currently plan is for patient to go home with home health support.  Presented to bedside to see patient.  Discussed care with RN to coordinate medical updates.  Patient was working with physical therapy so able to watch patient participate with this.  Patient walking hallway using a walker.  Patient's daughter, Jeremiah Zimmerman, present during session as well to assist. Once patient returned to room, formally able to introduce myself as a member of the palliative medicine team and my role in patient's medical journey.  [name] noted her sister had requested palliative medicine consult though they are unsure what palliative  medicine team would do to support patient.  Spent time introducing the role of palliative medicine team and those with serious medical illnesses.  Discussed how palliative medicine team can assist with symptom management and care planning moving forward.  Spent time discussing the differences between palliative medicine and hospice.  Spent time providing about patient's medical journey to this point.  Patient was diagnosed with Parkinson's disease approximately 5 years ago.  Daughter noted that patient has been doing overall well with really only 1 hospitalization around the pandemic.  Patient has had to go to the ER twice for falls though overall doing well at home.  Daughter noted that she has seen changes in patient's progression of Parkinson's including lessening appetite and more difficulty swallowing.  They continue to follow-up with patient's Parkinson's provider to discuss this. Spent time inquiring about hopes for medical care moving forward.  Patient and daughter hope patient can work to regain his strength to enjoy quality time at home.  Patient able to walk around home with a rollator.  Daughters assist in patient's care.  Patient's quality of life is important to him so noted importance of discussions moving forward to make sure maintaining his quality of life.  At this time patient planning to continue with therapies and following up with outpatient providers.  Discussed that should patient further deteriorate and have multiple hospitalizations that are affecting his quality of life, may need to consider reevaluation of goals for medical care moving forward to optimize quality time and not just quantity of time.  Patient and daughter acknowledged this.  Discussed home palliative medicine referral to continue discussions moving forward.  Hope is that patient will continue to improve so noted  that should patient deteriorate, would have system in place to have discussions  if needing to transition to  hospice.  Patient and daughter agreeing with this referral.  Noted would involve TOC to offer choice.  Spent time providing emotional support via active listening.  Noted palliative medicine team would be available if needed.  Discussed care with hospitalist, RN, and TOC to coordinate care.  Primary Diagnoses  Present on Admission:  UTI (urinary tract infection)   Palliative Review of Systems: Patient notes feeling better at this time  Past Medical History:  Diagnosis Date   Adenomatous colon polyp    Bladder cancer (HCC) 2010   BPH (benign prostatic hyperplasia)    Heart murmur    Hyperlipidemia    Hypertension    MVP (mitral valve prolapse) 01-13-13   hx. LBBB previous EKG 11'11  in Epic.   Parkinson's disease (HCC)    SBO (small bowel obstruction) (HCC) 07/18/2018   Seasonal allergies 01-13-13   Social History   Socioeconomic History   Marital status: Married    Spouse name: Not on file   Number of children: Not on file   Years of education: Not on file   Highest education level: Not on file  Occupational History   Not on file  Tobacco Use   Smoking status: Never   Smokeless tobacco: Never  Vaping Use   Vaping status: Never Used  Substance and Sexual Activity   Alcohol use: No   Drug use: No   Sexual activity: Not Currently  Other Topics Concern   Not on file  Social History Narrative   Right Handed    Lives in one story home with wife    Social Drivers of Health   Financial Resource Strain: Not on file  Food Insecurity: No Food Insecurity (07/08/2024)   Hunger Vital Sign    Worried About Running Out of Food in the Last Year: Never true    Ran Out of Food in the Last Year: Never true  Transportation Needs: No Transportation Needs (07/08/2024)   PRAPARE - Administrator, Civil Service (Medical): No    Lack of Transportation (Non-Medical): No  Physical Activity: Not on file  Stress: Not on file  Social Connections: Moderately Isolated  (07/08/2024)   Social Connection and Isolation Panel    Frequency of Communication with Friends and Family: Twice a week    Frequency of Social Gatherings with Friends and Family: Three times a week    Attends Religious Services: Patient unable to answer    Active Member of Clubs or Organizations: No    Attends Banker Meetings: Never    Marital Status: Married   Family History  Problem Relation Age of Onset   Breast cancer Mother    Prostate cancer Father    Colon cancer Brother    Cervical cancer Daughter    Thyroid cancer Daughter    Uterine cancer Daughter    Prostate cancer Brother    Alzheimer's disease Sister    Alzheimer's disease Sister    Healthy Son    Scheduled Meds:  Carbidopa -Levodopa  ER  2 tablet Oral TID   Chlorhexidine  Gluconate Cloth  6 each Topical Daily   enoxaparin  (LOVENOX ) injection  40 mg Subcutaneous QHS   tamsulosin   0.4 mg Oral Daily   Continuous Infusions:  cefTRIAXone  (ROCEPHIN )  IV 1 g (07/10/24 2227)   PRN Meds:.acetaminophen  (TYLENOL ) oral liquid 160 mg/5 mL **OR** acetaminophen , bisacodyl , ondansetron  **OR** ondansetron  (ZOFRAN ) IV,  senna-docusate Allergies  Allergen Reactions   Erythromycin Nausea Only and Other (See Comments)    REACTION: Intolerance to this medication with upset stomach (also)   Nitroglycerin Other (See Comments)    REACTION: Intolerance to this medication.  It drops his BP too low and quickly. (Also)   Sulfa Antibiotics Nausea Only and Rash   Tetracyclines & Related Nausea Only   Amoxicillin Diarrhea and Nausea Only   Penicillins Swelling and Rash    Has patient had a PCN reaction causing immediate rash, facial/tongue/throat swelling, SOB or lightheadedness with hypotension: Yes Has patient had a PCN reaction causing severe rash involving mucus membranes or skin necrosis: No Has patient had a PCN reaction that required hospitalization: No Has patient had a PCN reaction occurring within the last 10 years:  No If all of the above answers are NO, then may proceed with Cephalosporin use.   REACTION: Patient develops a rash and swelling at inject   CBC:    Component Value Date/Time   WBC 8.9 07/11/2024 0327   HGB 11.7 (L) 07/11/2024 0327   HCT 36.3 (L) 07/11/2024 0327   HCT 46.9 12/13/2019 1124   PLT 157 07/11/2024 0327   MCV 95.5 07/11/2024 0327   NEUTROABS 5.2 07/07/2024 1634   LYMPHSABS 0.8 07/07/2024 1634   MONOABS 1.4 (H) 07/07/2024 1634   EOSABS 0.0 07/07/2024 1634   BASOSABS 0.0 07/07/2024 1634   Comprehensive Metabolic Panel:    Component Value Date/Time   NA 137 07/11/2024 0327   K 4.0 07/11/2024 0327   CL 105 07/11/2024 0327   CO2 25 07/11/2024 0327   BUN 12 07/11/2024 0327   CREATININE 0.62 07/11/2024 0327   CREATININE 0.98 12/13/2019 1124   GLUCOSE 104 (H) 07/11/2024 0327   CALCIUM 8.2 (L) 07/11/2024 0327   AST 26 07/09/2024 0303   AST 14 (L) 12/13/2019 1124   ALT 7 07/09/2024 0303   ALT <6 12/13/2019 1124   ALKPHOS 105 07/09/2024 0303   BILITOT 0.5 07/09/2024 0303   BILITOT 1.0 12/13/2019 1124   PROT 5.2 (L) 07/09/2024 0303   ALBUMIN 2.2 (L) 07/09/2024 0303    Physical Exam: Vital Signs: BP (!) 144/69 (BP Location: Right Arm)   Pulse 75   Temp 97.8 F (36.6 C) (Oral)   Resp 18   Ht 5' 10 (1.778 m)   Wt 61.2 kg   SpO2 97%   BMI 19.37 kg/m  SpO2: SpO2: 97 % O2 Device: O2 Device: Room Air O2 Flow Rate:   Intake/output summary:  Intake/Output Summary (Last 24 hours) at 07/11/2024 0801 Last data filed at 07/11/2024 0200 Gross per 24 hour  Intake 680 ml  Output 1350 ml  Net -670 ml   LBM: Last BM Date : 07/08/24 Baseline Weight: Weight: 61.2 kg Most recent weight: Weight: 61.2 kg  General: NAD, alert, pleasant, elderly Cardiovascular: RRR Respiratory: no increased work of breathing noted, not in respiratory distress Neuro: A&Ox4, following commands easily Psych: appropriately answers all questions          Palliative Performance Scale:  60%              Additional Data Reviewed: Recent Labs    07/10/24 0326 07/11/24 0327  WBC 8.3 8.9  HGB 11.6* 11.7*  PLT 157 157  NA 138 137  BUN 11 12  CREATININE 0.61 0.62    Imaging: CT ABDOMEN PELVIS W CONTRAST CLINICAL DATA:  Acute abdominal pain.  Fever.  EXAM: CT ABDOMEN AND PELVIS WITH  CONTRAST  TECHNIQUE: Multidetector CT imaging of the abdomen and pelvis was performed using the standard protocol following bolus administration of intravenous contrast.  RADIATION DOSE REDUCTION: This exam was performed according to the departmental dose-optimization program which includes automated exposure control, adjustment of the mA and/or kV according to patient size and/or use of iterative reconstruction technique.  CONTRAST:  OMNIPAQUE  IOHEXOL  300 MG/ML  SOLN  COMPARISON:  04/20/2019  FINDINGS: Lower Chest: No acute findings.  Hepatobiliary: No suspicious hepatic masses identified. Prior cholecystectomy. No evidence of biliary obstruction.  Pancreas:  No mass or inflammatory changes.  Spleen: Within normal limits in size and appearance.  Adrenals/Urinary Tract: No suspicious masses identified. Bilateral benign-appearing renal cysts again noted (No followup imaging is recommended). No evidence of ureteral calculi or hydronephrosis. Foley catheter seen in the bladder. Diffuse bladder wall thickening is seen, presumably due to chronic bladder outlet obstruction given enlarged prostate.  Stomach/Bowel: No evidence of obstruction, inflammatory process or abnormal fluid collections.  Vascular/Lymphatic: No pathologically enlarged lymph nodes. No acute vascular findings.  Reproductive:  Stable markedly enlarged prostate.  Other:  None.  Musculoskeletal:  No suspicious bone lesions identified.  IMPRESSION: No acute findings.  Stable markedly enlarged prostate, and findings of chronic bladder outlet obstruction. Foley catheter within  bladder.  Electronically Signed   By: Norleen DELENA Kil M.D.   On: 07/07/2024 18:46 DG Chest Portable 1 View CLINICAL DATA:  weakness  EXAM: PORTABLE CHEST - 1 VIEW  COMPARISON:  Apr 05, 2024  FINDINGS: No focal airspace consolidation, pleural effusion, or pneumothorax. No cardiomegaly. Aortic atherosclerosis. No acute fracture or destructive lesions. Multilevel thoracic osteophytosis.  IMPRESSION: No acute cardiopulmonary abnormality.  Electronically Signed   By: Rogelia Myers M.D.   On: 07/07/2024 17:12    I personally reviewed recent imaging.   Palliative Care Assessment and Plan Summary of Established Goals of Care and Medical Treatment Preferences   Patient is an 89 year old male with a past medical history of Parkinson's disease, remote history of bladder cancer, BPH, hypertension, hyperlipidemia, and recent urinary retention status post Foley placement 07/05/2024 who was admitted on 07/07/2024 with persistent fevers.  Patient had been receiving antibiotics for urinary tract infection prior to admission.  Upon admission, patient received further management with adjustment of antibiotics for acute cystitis.  Urology consulted for recommendations.  Palliative medicine team consulted for complex medical decision making at family's request.  # Complex medical decision making/goals of care  - Discussed care with patient and daughter at bedside as detailed above in HPI.  Spent time discussing home palliative medicine services.  Explained similarities and differences between palliative medicine and hospice.  At this time patient is hoping to regain strength at home with home health; daughter (while.  Plan to continue following up with outpatient medical providers.  Noted importance of continued discussions moving forward as patient's quality of life is important to him.  Patient and daughter agreeing with home palliative medicine referral.  -  Code Status: Limited: Do not attempt  resuscitation (DNR) -DNR-LIMITED -Do Not Intubate/DNI   # Psycho-social/Spiritual Support:  - Support System: Wife, 2 daughters, son  # Discharge Planning:  Home with Palliative Services and home health  Thank you for allowing the palliative care team to participate in the care Carlin JINNY Roys.  Tinnie Radar, DO Palliative Care Provider PMT # [phone]  If patient remains symptomatic despite maximum doses, please call PMT at [phone] between 0700 and 1900. Outside of these hours, please call attending, as  PMT does not have night coverage.  Personally spent 75 minutes in patient care including extensive chart review (labs, imaging, progress/consult notes, vital signs), medically appropraite exam, discussed with treatment team, education to patient, family, and staff, documenting clinical information, medication review and management, coordination of care, and available advanced directive documents.

## 2024-07-11 NOTE — Plan of Care (Signed)
  Problem: Pain Managment: Goal: General experience of comfort will improve and/or be controlled Outcome: Progressing   Problem: Safety: Goal: Ability to remain free from injury will improve Outcome: Progressing   Problem: Skin Integrity: Goal: Risk for impaired skin integrity will decrease Outcome: Progressing

## 2024-07-11 NOTE — Plan of Care (Signed)
  Problem: Health Behavior/Discharge Planning: Goal: Ability to manage health-related needs will improve Outcome: Progressing   Problem: Clinical Measurements: Goal: Will remain free from infection Outcome: Progressing Goal: Diagnostic test results will improve Outcome: Progressing   Problem: Activity: Goal: Risk for activity intolerance will decrease Outcome: Progressing

## 2024-07-11 NOTE — Progress Notes (Signed)
 Physical Therapy Treatment Patient Details Name: Jeremiah Zimmerman MRN: [id number] DOB: [birth year] Today's Date: 07/11/2024   History of Present Illness Jeremiah Zimmerman is a 89 y.o. male admitted 07/07/24 with medical history significant for Parkinson's disease, bladder cancer, BPH, HTN, HLD and recent urinary retention s/p Foley placement on 8/20 now presenting with persistent fevers.    PT Comments  Camren is making good progress this session. Demonstrates improved activity tolerance/incr gait distance. ~ 2 minute sitting rest d/t c/o L calf cramp. Pt able to continue, ascend /descend single step and amb greater distance back to room, working on  [employer] safety, head turns and higher level balance activities on return trip. Pt with dtr and palliative MD present EOS.    If plan is discharge home, recommend the following: A little help with walking and/or transfers;A little help with bathing/dressing/bathroom;Assistance with cooking/housework;Help with stairs or ramp for entrance;Assist for transportation   Can travel by private vehicle        Equipment Recommendations  Rolling walker (2 wheels)    Recommendations for Other Services       Precautions / Restrictions Precautions Precautions: Fall Recall of Precautions/Restrictions: Intact Restrictions Weight Bearing Restrictions Per Provider Order: No     Mobility  Bed Mobility Overal bed mobility: Needs Assistance Bed Mobility: Supine to Sit     Supine to sit: Min assist     General bed mobility comments: assist with trunk elevation, incr time needed. bed pad used to assist scooting to EOB    Transfers Overall transfer level: Needs assistance Equipment used: Rolling walker (2 wheels) Transfers: Sit to/from Stand Sit to Stand: Supervision           General transfer comment: supervision for safety. cues for hand placement, knee flexion and anterior wt shift    Ambulation/Gait Ambulation/Gait assistance: Contact guard  assist, Supervision Gait Distance (Feet): 120 Feet (40') Assistive device: Rolling walker (2 wheels) Gait Pattern/deviations: Step-through pattern, Decreased stride length, Narrow base of support Gait velocity: decr     General Gait Details: cues for trunk extension, upward gaze and RW position, CGA fade to supervision. no overt LOB   Stairs Stairs: Yes Stairs assistance: Contact guard assist, Min assist Stair Management: No rails, Step to pattern, With walker Number of Stairs: 1 General stair comments: assist to manage RW, cues for technique, power up with RLE d/t cramp LLE   Wheelchair Mobility     Tilt Bed    Modified Rankin (Stroke Patients Only)       Balance   Sitting-balance support: Feet supported, No upper extremity supported Sitting balance-Leahy Scale: Fair Sitting balance - Comments: difficulty with lateral shifting   Standing balance support: During functional activity, Reliant on assistive device for balance Standing balance-Leahy Scale: Poor                              Communication Communication Communication: No apparent difficulties  Cognition Arousal: Alert Behavior During Therapy: WFL for tasks assessed/performed, Flat affect   PT - Cognitive impairments: Memory, Initiation                       PT - Cognition Comments: pt with no recal of room #, location Following commands: Intact      Cueing Cueing Techniques: Verbal cues, Gestural cues  Exercises      General Comments        Pertinent Vitals/Pain Pain Assessment  Pain Assessment: Faces Faces Pain Scale: Hurts little more Pain Location: L calf Pain Descriptors / Indicators: Cramping Pain Intervention(s): Limited activity within patient's tolerance, Monitored during session, Repositioned    Home Living                          Prior Function            PT Goals (current goals can now be found in the care plan section) Acute Rehab PT  Goals Patient Stated Goal: restore strength and endurance PT Goal Formulation: With patient Time For Goal Achievement: 07/23/24 Potential to Achieve Goals: Good Progress towards PT goals: Progressing toward goals    Frequency    Min 3X/week      PT Plan      Co-evaluation              AM-PAC PT 6 Clicks Mobility   Outcome Measure  Help needed turning from your back to your side while in a flat bed without using bedrails?: A Little Help needed moving from lying on your back to sitting on the side of a flat bed without using bedrails?: A Little Help needed moving to and from a bed to a chair (including a wheelchair)?: A Little Help needed standing up from a chair using your arms (e.g., wheelchair or bedside chair)?: A Little Help needed to walk in hospital room?: A Little Help needed climbing 3-5 steps with a railing? : A Little 6 Click Score: 18    End of Session Equipment Utilized During Treatment: Gait belt Activity Tolerance: Patient tolerated treatment well Patient left: with call bell/phone within reach;in chair;with chair alarm set;with family/visitor present;Other (comment) (Palliative MD) Nurse Communication: Mobility status PT Visit Diagnosis: Muscle weakness (generalized) (M62.81);Unsteadiness on feet (R26.81);Difficulty in walking, not elsewhere classified (R26.2);Other symptoms and signs involving the nervous system (R29.898)     Time: 1129-1201 PT Time Calculation (min) (ACUTE ONLY): 32 min  Charges:    $Gait Training: 23-37 mins PT General Charges $$ ACUTE PT VISIT: 1 Visit                     Arra Connaughton, PT  Acute Rehab Dept [facility]) [phone]  07/11/2024    [facility] 07/11/2024, 12:47 PM

## 2024-07-12 ENCOUNTER — Other Ambulatory Visit (HOSPITAL_COMMUNITY): Payer: Self-pay

## 2024-07-12 DIAGNOSIS — N3001 Acute cystitis with hematuria: Secondary | ICD-10-CM | POA: Diagnosis not present

## 2024-07-12 DIAGNOSIS — N410 Acute prostatitis: Secondary | ICD-10-CM

## 2024-07-12 DIAGNOSIS — R338 Other retention of urine: Secondary | ICD-10-CM | POA: Diagnosis not present

## 2024-07-12 LAB — CULTURE, BLOOD (ROUTINE X 2)
Culture: NO GROWTH
Special Requests: ADEQUATE

## 2024-07-12 MED ORDER — CEFADROXIL 500 MG PO CAPS
500.0000 mg | ORAL_CAPSULE | Freq: Two times a day (BID) | ORAL | 0 refills | Status: AC
  Filled 2024-07-12: qty 16, 8d supply, fill #0

## 2024-07-12 NOTE — Progress Notes (Signed)
 Discharge medication delivered to patient at bedside [facility]

## 2024-07-12 NOTE — Progress Notes (Signed)
 Occupational Therapy Treatment Patient Details Name: Jeremiah Zimmerman MRN: [id number] DOB: 03-31-1934 Today's Date: 07/12/2024   History of present illness Jeremiah Zimmerman is a 89 y.o. male admitted 07/07/24 with medical history significant for Parkinson's disease, bladder cancer, BPH, HTN, HLD and recent urinary retention s/p Foley placement on 8/20 now presenting with persistent fevers.   OT comments  Pt seen for OT tx this date. Session focused on caregiver education/training, functional transfer practice and toileting tasks bathroom level. Daughter provides MIN A for bed mobility (pt likely able to perform with CGA and increased time), pt performs multiple STS transfers from varied surface levels with MIN A from daughter, OT provides cues for RW mgmt, hand placement and anterior weight shifting. MAX A for pericare in standing after continent BM. Pt fatigues quickly after minimal activity, discussed with pt and daughter importance of frequent mobilization as tolerated throughout the day to prevent deconditioning and increase continued independence in ADL performance. Daughter has declined STR, pt would benefit from [facility] OT services to assist with decreasing caregiver burden, increasing strength, balance and mobility. OT will follow acutely.       If plan is discharge home, recommend the following:  Assistance with cooking/housework;Help with stairs or ramp for entrance;Assist for transportation;A lot of help with bathing/dressing/bathroom;A lot of help with walking and/or transfers;Direct supervision/assist for medications management;Direct supervision/assist for financial management;Supervision due to cognitive status   Equipment Recommendations  None recommended by OT    Recommendations for Other Services      Precautions / Restrictions Precautions Precautions: Fall Recall of Precautions/Restrictions: Intact Restrictions Weight Bearing Restrictions Per Provider Order: No       Mobility  Bed Mobility Overal bed mobility: Needs Assistance Bed Mobility: Supine to Sit, Sit to Supine     Supine to sit: Min assist Sit to supine: Min assist   General bed mobility comments: daughter provides minA (pt's baseline per report) for trunk elevation and BLE mgmt. pt likely demonstrates ability to perform with CGA and increased time    Transfers Overall transfer level: Needs assistance Equipment used: Rolling walker (2 wheels) Transfers: Sit to/from Stand Sit to Stand: Contact guard assist, From elevated surface     Step pivot transfers: Contact guard assist, Min assist     General transfer comment: increased time, daughter provides CGA and stabilizes RW     Balance Overall balance assessment: Needs assistance Sitting-balance support: Feet supported, No upper extremity supported Sitting balance-Leahy Scale: Fair     Standing balance support: During functional activity, Reliant on assistive device for balance Standing balance-Leahy Scale: Fair Standing balance comment: poor tolerance                           ADL either performed or assessed with clinical judgement   ADL Overall ADL's : Needs assistance/impaired                         Toilet Transfer: Contact guard assist;Minimal assistance;Ambulation;Rolling walker (2 wheels) Toilet Transfer Details (indicate cue type and reason): daughter preferring to assist with transfer (stabilizing RW as pt uses rollator at home), OT provides cues for hand placement and assist at trunk to control STS from regular height toilet. Pt has raised commode at thome with bilat grab bars. Toileting- Clothing Manipulation and Hygiene: Maximal assistance;Sit to/from stand Toileting - Clothing Manipulation Details (indicate cue type and reason): maxA for pericare in standing  Functional mobility during ADLs: Contact guard assist;Rolling walker (2 wheels) General ADL Comments: Pt participates in toileting tasks, and 3x  STS from recliner level to increase strength and decrease caregiver burden as pt plans to return home with daughter providing physical assist     Communication Communication Communication: No apparent difficulties   Cognition Arousal: Alert Behavior During Therapy: Flat affect                                 Following commands: Intact        Cueing   Cueing Techniques: Verbal cues, Gestural cues             Pertinent Vitals/ Pain       Pain Assessment Pain Assessment: No/denies pain   Frequency  Min 2X/week        Progress Toward Goals  OT Goals(current goals can now be found in the care plan section)  Progress towards OT goals: Progressing toward goals  Acute Rehab OT Goals OT Goal Formulation: With patient/family Time For Goal Achievement: 07/23/24 Potential to Achieve Goals: Good ADL Goals Pt Will Perform Grooming: with supervision;standing;sitting Pt Will Perform Lower Body Dressing: with supervision;sitting/lateral leans;sit to/from stand Pt Will Transfer to Toilet: with supervision;ambulating Pt Will Perform Toileting - Clothing Manipulation and hygiene: with supervision;sit to/from stand  Plan         AM-PAC OT 6 Clicks Daily Activity     Outcome Measure   Help from another person eating meals?: A Lot Help from another person taking care of personal grooming?: A Little Help from another person toileting, which includes using toliet, bedpan, or urinal?: A Lot Help from another person bathing (including washing, rinsing, drying)?: A Lot Help from another person to put on and taking off regular upper body clothing?: A Little Help from another person to put on and taking off regular lower body clothing?: A Lot 6 Click Score: 14    End of Session Equipment Utilized During Treatment: Gait belt;Rolling walker (2 wheels)  OT Visit Diagnosis: Unsteadiness on feet (R26.81);Other abnormalities of gait and mobility (R26.89);Muscle weakness  (generalized) (M62.81);Feeding difficulties (R63.3)   Activity Tolerance Patient limited by fatigue   Patient Left in bed;with call bell/phone within reach;with bed alarm set;with family/visitor present   Nurse Communication Mobility status        Time: 9097-9069 OT Time Calculation (min): 28 min  Charges: OT General Charges $OT Visit: 1 Visit OT Treatments $Self Care/Home Management : 23-37 mins  Angelise Petrich L. Maleigh Bagot, OTR/L  07/12/24, 9:41 AM

## 2024-07-12 NOTE — Progress Notes (Signed)
 Physical Therapy Treatment Patient Details Name: Jeremiah Zimmerman MRN: [id number] DOB: 11-27-1933 Today's Date: 07/12/2024   History of Present Illness GAL FELDHAUS is a 89 y.o. male admitted 07/07/24 with medical history significant for Parkinson's disease, bladder cancer, BPH, HTN, HLD and recent urinary retention s/p Foley placement on 8/20 now presenting with persistent fevers.    PT Comments  Pt reports he is tired and sore from yesterday. Agreeable to OOB with encouragement.  Pt overall at supervision level today, requiring incr time to complete tasks but no significant physical assist; meeting PT goals and is close to  baseline mobility,  pt/family will be able to manage at home. Will benefit from continued HHPT at d/c    If plan is discharge home, recommend the following: A little help with walking and/or transfers;A little help with bathing/dressing/bathroom;Assistance with cooking/housework;Help with stairs or ramp for entrance;Assist for transportation   Can travel by private vehicle        Equipment Recommendations  Rolling walker (2 wheels)    Recommendations for Other Services       Precautions / Restrictions Precautions Precautions: Fall Recall of Precautions/Restrictions: Intact Restrictions Weight Bearing Restrictions Per Provider Order: No     Mobility  Bed Mobility Overal bed mobility: Needs Assistance Bed Mobility: Sidelying to Sit, Rolling Rolling: Supervision, Used rails Sidelying to sit: Supervision, HOB elevated, Used rails       General bed mobility comments: pt able to self progress LEs off EOB, roll to R side and elevate trunk with incr time and use of rail. pt able to scoot to EOB wiht incr time and cues to self assist    Transfers Overall transfer level: Needs assistance Equipment used: Rolling walker (2 wheels) Transfers: Sit to/from Stand Sit to Stand: Supervision           General transfer comment: supervision for safety. cues for hand  placement, knee flexion and anterior wt shift; repeated x3 from varied ht surfaces    Ambulation/Gait Ambulation/Gait assistance: Supervision Gait Distance (Feet): 140 Feet Assistive device: Rolling walker (2 wheels) Gait Pattern/deviations: Step-through pattern, Decreased stride length, Narrow base of support Gait velocity: decr     General Gait Details: cues for trunk extension, upward gaze and RW position, supervision for safety.  no overt LOB with min perturbations   Stairs             Wheelchair Mobility     Tilt Bed    Modified Rankin (Stroke Patients Only)       Balance   Sitting-balance support: Feet supported, No upper extremity supported Sitting balance-Leahy Scale: Fair Sitting balance - Comments: difficulty with lateral shifting   Standing balance support: During functional activity, Reliant on assistive device for balance, No upper extremity supported Standing balance-Leahy Scale: Fair                              Hotel manager: No apparent difficulties  Cognition Arousal: Alert Behavior During Therapy: WFL for tasks assessed/performed, Flat affect   PT - Cognitive impairments: Memory, Initiation                         Following commands: Intact      Cueing Cueing Techniques: Verbal cues, Gestural cues  Exercises      General Comments        Pertinent Vitals/Pain Pain Assessment Pain Assessment: Faces Faces Pain Scale:  Hurts little more Pain Location: LEs Pain Descriptors / Indicators: Sore Pain Intervention(s): Limited activity within patient's tolerance, Monitored during session, Repositioned    Home Living                          Prior Function            PT Goals (current goals can now be found in the care plan section) Acute Rehab PT Goals Patient Stated Goal: restore strength and endurance PT Goal Formulation: With patient Time For Goal Achievement:  07/23/24 Potential to Achieve Goals: Good Progress towards PT goals: Progressing toward goals    Frequency    Min 3X/week      PT Plan      Co-evaluation              AM-PAC PT 6 Clicks Mobility   Outcome Measure  Help needed turning from your back to your side while in a flat bed without using bedrails?: A Little Help needed moving from lying on your back to sitting on the side of a flat bed without using bedrails?: A Little Help needed moving to and from a bed to a chair (including a wheelchair)?: A Little Help needed standing up from a chair using your arms (e.g., wheelchair or bedside chair)?: A Little Help needed to walk in hospital room?: A Little Help needed climbing 3-5 steps with a railing? : A Little 6 Click Score: 18    End of Session Equipment Utilized During Treatment: Gait belt Activity Tolerance: Patient tolerated treatment well Patient left: with call bell/phone within reach;with family/visitor present;Other (comment);with nursing/sitter in room (batrhoom) Nurse Communication: Mobility status PT Visit Diagnosis: Muscle weakness (generalized) (M62.81);Unsteadiness on feet (R26.81);Difficulty in walking, not elsewhere classified (R26.2);Other symptoms and signs involving the nervous system (R29.898)     Time: 8953-8889 PT Time Calculation (min) (ACUTE ONLY): 24 min  Charges:    $Gait Training: 8-22 mins $Therapeutic Activity: 8-22 mins PT General Charges $$ ACUTE PT VISIT: 1 Visit                     Maelle Sheaffer, PT  Acute Rehab Dept [facility]) [phone]  07/12/2024    [facility] 07/12/2024, 11:16 AM

## 2024-07-12 NOTE — TOC Progression Note (Signed)
 Transition of Care [facility]) - Progression Note    Patient Details  Name: Jeremiah Zimmerman MRN: [id number] Date of Birth: 01-20-1934  Transition of [facility]) CM/SW Contact  Alfonse JONELLE Rex, RN Phone Number: 07/12/2024, 9:43 AM  Clinical Narrative:  Met with pt/s dtr, [name], accepted referral to [facility] for Home Palliative. [facility], rep-Melissa notified, request bedside visit today prior to patient dc if possible.      Expected Discharge Plan: Home w Home Health Services Barriers to Discharge: Continued Medical Work up               Expected Discharge Plan and Services     Post Acute Care Choice: Home Health Living arrangements for the past 2 months: Single Family Home                 DME Arranged: Walker rolling DME Agency: [employer] Date DME Agency Contacted: 07/10/24 Time DME Agency Contacted: 1056 Representative spoke with at DME Agency: Mitch HH Arranged: PT, OT           Social Drivers of Health (SDOH) Interventions SDOH Screenings   Food Insecurity: No Food Insecurity (07/08/2024)  Housing: Low Risk  (07/08/2024)  Transportation Needs: No Transportation Needs (07/08/2024)  Utilities: Not At Risk (07/08/2024)  Social Connections: Moderately Isolated (07/08/2024)  Tobacco Use: Low Risk  (07/07/2024)    Readmission Risk Interventions    07/10/2024   10:53 AM  Readmission Risk Prevention Plan  Transportation Screening Complete  PCP or Specialist Appt within 5-7 Days Complete  Home Care Screening Complete  Medication Review (RN CM) Complete

## 2024-07-12 NOTE — Progress Notes (Signed)
 Discharge paperwork printed and reviewed with the pt and daughter. Education provided on foley care at home. Pt and daughter verbalized understanding. Awaiting on pharmacy to deliver medication.

## 2024-07-12 NOTE — Discharge Summary (Signed)
 Physician Discharge Summary  Jeremiah Zimmerman FMW:[id number] DOB: 29-Apr-1934 DOA: 07/07/2024  PCP: Onita Rush, MD  Admit date: 07/07/2024 Discharge date: 07/12/2024  Admitted From: Home Disposition: Home with home health and palliative care  Recommendations for Outpatient Follow-up:  Follow up with PCP in 1-2 weeks Please obtain BMP/CBC in one week Please follow up on the following pending results:  Home Health: PT/OT; referred to [facility] for home palliative Equipment/Devices: Foley catheter  Discharge Condition: Stable CODE STATUS: DNR Diet recommendation: Regular diet  History of present illness:  Jeremiah Zimmerman is a 89 y.o. male with past medical history significant for Parkinson's disease, bladder cancer, BPH, HTN, HLD, history of recent urinary retention s/p Foley placement 8/20 who presented to [facility] ED on 07/07/2024 with persistent fevers. Per daughter, patient started having fever last Sunday up to 102.3.  He was evaluated by PCP on Tuesday, diagnosed with UTI and prescribed Cipro . On Wednesday, he presented to the ED due to urinary retention, Foley catheter was placed and ultimately discharged to follow-up with urology.  Continue to have fevers and daughter elected to represent to the ED for further evaluation.   Hospital course:  Acute cystitis/prostatitis Patient presenting with recurrent urinary retention with prior Foley catheter placement outpatient which followed with removal.  Patient developed persistent fevers and dysuria.  Was initially started on ciprofloxacin  outpatient but continued with symptoms.  Patient was admitted and Foley catheter was replaced and started on IV ceftriaxone .  Urine culture performed outpatient at [facility] positive for E. coli.  Ceftriaxone  was transition to cefadroxil  500 mg p.o. twice daily with resolution of fevers.  Patient was seen by urology with plan to maintain Foley catheter on discharge.  Patient will continue  cefadroxil  to complete 14-day course total antibiotics.  Outpatient follow-up with PCP/urology.  Urinary retention BPH Paraphimosis Patient found to have urinary retention at ED visit on 8/20 now status post Foley placement.  CT abdomen/pelvis shows markedly enlarged prostate and Foley catheter in the bladder, stable.  Seen by urology, Dr. Watt while inpatient.  Continue tamsulosin .  Outpatient follow-up with urology for voiding trial  Positive blood culture 1 out of 4 blood cultures (aerobic bottle only) positive for Staphylococcus hominis, suspect contaminant.   Parkinson's disease Continue home carbidopa  levodopa .  Palliative care to also follow-up outpatient.   Generalized weakness Seen by PT/OT with a readmission of home health.  Discharge Diagnoses:  Principal Problem:   UTI (urinary tract infection) Active Problems:   Generalized weakness   Acute urinary retention   Benign prostatic hyperplasia with lower urinary tract symptoms   Palliative care encounter   Goals of care, counseling/discussion   Counseling and coordination of care    Discharge Instructions  Discharge Instructions     Call MD for:  difficulty breathing, headache or visual disturbances   Complete by: As directed    Call MD for:  extreme fatigue   Complete by: As directed    Call MD for:  persistant dizziness or light-headedness   Complete by: As directed    Call MD for:  persistant nausea and vomiting   Complete by: As directed    Call MD for:  severe uncontrolled pain   Complete by: As directed    Call MD for:  temperature >100.4   Complete by: As directed    Continue Urinary Catheter   Complete by: As directed    Diet - low sodium heart healthy   Complete by: As directed  Increase activity slowly   Complete by: As directed       Allergies as of 07/12/2024       Reactions   Erythromycin Nausea Only, Other (See Comments)   REACTION: Intolerance to this medication with upset stomach  (also)   Nitroglycerin Other (See Comments)   REACTION: Intolerance to this medication.  It drops his BP too low and quickly. (Also)   Sulfa Antibiotics Nausea Only, Rash   Tetracyclines & Related Nausea Only   Amoxicillin Diarrhea, Nausea Only   Penicillins Swelling, Rash   Has patient had a PCN reaction causing immediate rash, facial/tongue/throat swelling, SOB or lightheadedness with hypotension: Yes Has patient had a PCN reaction causing severe rash involving mucus membranes or skin necrosis: No Has patient had a PCN reaction that required hospitalization: No Has patient had a PCN reaction occurring within the last 10 years: No If all of the above answers are NO, then may proceed with Cephalosporin use. REACTION: Patient develops a rash and swelling at inject        Medication List     STOP taking these medications    ciprofloxacin  500 MG tablet Commonly known as: CIPRO    polyethylene glycol 17 g packet Commonly known as: MiraLax        TAKE these medications    AMBULATORY NON FORMULARY MEDICATION Lift Chair Dx:  G20   AMBULATORY NON FORMULARY MEDICATION Rollator Dx: G20   Carbidopa -Levodopa  ER 25-100 MG tablet controlled release Commonly known as: SINEMET  CR Take 2 tablets by mouth 3 (three) times daily. 8am/noon/4pm What changed: additional instructions   cefadroxil  500 MG capsule Commonly known as: DURICEF Take 1 capsule (500 mg total) by mouth 2 (two) times daily for 8 days.   multivitamin capsule Take 1 capsule by mouth daily with breakfast.   tamsulosin  0.4 MG Caps capsule Commonly known as: FLOMAX  Take 0.4 mg by mouth in the morning.   Tylenol  Childrens 160 MG/5ML suspension Generic drug: acetaminophen  Take 480 mg by mouth every 6 (six) hours as needed for fever or mild pain (pain score 1-3) (or discomfort).        Follow-up Information     [facility] Follow up.   Why: [facility]   Home Health :  Physical  Therapy Occupational Therapy Contact information: [address] [place] [address] [phone]         [facility]. Go on 07/20/2024.   Contact information: [address] [place] [address] [place]  [address] [phone]        Onita Rush, MD. Schedule an appointment as soon as possible for a visit in 1 week(s).   Specialty: Internal Medicine Contact information: [address] [place] [address] [phone]                Allergies  Allergen Reactions   Erythromycin Nausea Only and Other (See Comments)    REACTION: Intolerance to this medication with upset stomach (also)   Nitroglycerin Other (See Comments)    REACTION: Intolerance to this medication.  It drops his BP too low and quickly. (Also)   Sulfa Antibiotics Nausea Only and Rash   Tetracyclines & Related Nausea Only   Amoxicillin Diarrhea and Nausea Only   Penicillins Swelling and Rash    Has patient had a PCN reaction causing immediate rash, facial/tongue/throat swelling, SOB or lightheadedness with hypotension: Yes Has patient had a PCN reaction causing severe rash involving mucus membranes or skin  necrosis: No Has patient had a PCN reaction that required hospitalization: No Has patient had a PCN reaction occurring within the last 10 years: No If all of the above answers are NO, then may proceed with Cephalosporin use.   REACTION: Patient develops a rash and swelling at inject    Consultations: Urology, Dr. Watt   Procedures/Studies: CT ABDOMEN PELVIS W CONTRAST Result Date: 07/07/2024 CLINICAL DATA:  Acute abdominal pain.  Fever. EXAM: CT ABDOMEN AND PELVIS WITH CONTRAST TECHNIQUE: Multidetector CT imaging of the abdomen and pelvis was performed using the standard protocol following bolus administration of intravenous contrast. RADIATION DOSE REDUCTION: This exam was performed according to the departmental dose-optimization program which includes automated  exposure control, adjustment of the mA and/or kV according to patient size and/or use of iterative reconstruction technique. CONTRAST:  OMNIPAQUE  IOHEXOL  300 MG/ML  SOLN COMPARISON:  04/20/2019 FINDINGS: Lower Chest: No acute findings. Hepatobiliary: No suspicious hepatic masses identified. Prior cholecystectomy. No evidence of biliary obstruction. Pancreas:  No mass or inflammatory changes. Spleen: Within normal limits in size and appearance. Adrenals/Urinary Tract: No suspicious masses identified. Bilateral benign-appearing renal cysts again noted (No followup imaging is recommended). No evidence of ureteral calculi or hydronephrosis. Foley catheter seen in the bladder. Diffuse bladder wall thickening is seen, presumably due to chronic bladder outlet obstruction given enlarged prostate. Stomach/Bowel: No evidence of obstruction, inflammatory process or abnormal fluid collections. Vascular/Lymphatic: No pathologically enlarged lymph nodes. No acute vascular findings. Reproductive:  Stable markedly enlarged prostate. Other:  None. Musculoskeletal:  No suspicious bone lesions identified. IMPRESSION: No acute findings. Stable markedly enlarged prostate, and findings of chronic bladder outlet obstruction. Foley catheter within bladder. Electronically Signed   By: Norleen DELENA Kil M.D.   On: 07/07/2024 18:46   DG Chest Portable 1 View Result Date: 07/07/2024 CLINICAL DATA:  weakness EXAM: PORTABLE CHEST - 1 VIEW COMPARISON:  Apr 05, 2024 FINDINGS: No focal airspace consolidation, pleural effusion, or pneumothorax. No cardiomegaly. Aortic atherosclerosis. No acute fracture or destructive lesions. Multilevel thoracic osteophytosis. IMPRESSION: No acute cardiopulmonary abnormality. Electronically Signed   By: Rogelia Myers M.D.   On: 07/07/2024 17:12     Subjective: Patient seen examined bedside, lying in bed.  Daughter present at bedside.  No complaints this morning.  Remains afebrile.  Discussed discharge  home today, agreeable.  Daughter reports has appointment next Thursday at urology office for voiding trial.  No other questions or concerns at this time.  Denies headache, no chest pain, no shortness of breath, no abdominal pain, no fever/chills, no nausea/vomiting/diarrhea.  No acute events overnight per nursing staff.  Discharge Exam: Vitals:   07/11/24 2050 07/12/24 0656  BP: (!) 129/58 139/65  Pulse: 90 77  Resp: 15 17  Temp: (!) 97.5 F (36.4 C) 98.2 F (36.8 C)  SpO2: 95% 96%   Vitals:   07/11/24 0540 07/11/24 1307 07/11/24 2050 07/12/24 0656  BP: (!) 144/69 118/60 (!) 129/58 139/65  Pulse: 75 87 90 77  Resp: 18 18 15 17   Temp: 97.8 F (36.6 C) 99.1 F (37.3 C) (!) 97.5 F (36.4 C) 98.2 F (36.8 C)  TempSrc: Oral Oral  Oral  SpO2: 97% 97% 95% 96%  Weight:      Height:        Physical Exam: GEN: NAD, alert and oriented x 3, elderly appearance HEENT: NCAT, PERRL, EOMI, sclera clear, MMM PULM: CTAB w/o wheezes/crackles, normal respiratory effort, on room air CV: RRR w/o M/G/R GI: abd soft, NTND, +  BS GU: Foley catheter noted in place, clear yellow urine in collection bag MSK: no peripheral edema NEURO: No focal neurological deficit PSYCH: normal mood/affect Integumentary: dry/intact, no rashes or wounds    The results of significant diagnostics from this hospitalization (including imaging, microbiology, ancillary and laboratory) are listed below for reference.     Microbiology: Recent Results (from the past 240 hours)  Urine Culture     Status: Abnormal   Collection Time: 07/05/24  8:34 PM   Specimen: Urine, Clean Catch  Result Value Ref Range Status   Specimen Description   Final    URINE, CLEAN CATCH Performed at [facility], [address], [place], [place] [address]    Special Requests   Final    NONE Performed at [facility], [address], [place], [place] [address]    Culture (A)  Final    <10,000  COLONIES/mL >=100,000 COLONIES/mL Performed at [facility], [address] [place], [place] [address]    Report Status 07/07/2024 FINAL  Final  Culture, blood (routine x 2)     Status: Abnormal   Collection Time: 07/07/24  4:34 PM   Specimen: BLOOD  Result Value Ref Range Status   Specimen Description BLOOD SITE NOT SPECIFIED  Final   Special Requests   Final    BOTTLES DRAWN AEROBIC AND ANAEROBIC Blood Culture adequate volume   Culture  Setup Time   Final    GRAM POSITIVE COCCI AEROBIC BOTTLE ONLY CRITICAL RESULT CALLED TO, READ BACK BY AND VERIFIED WITH: PHARMD ABBY E. [id number] AT 1440, ADC    Culture (A)  Final    STAPHYLOCOCCUS HOMINIS THE SIGNIFICANCE OF ISOLATING THIS ORGANISM FROM A SINGLE SET OF BLOOD CULTURES WHEN MULTIPLE SETS ARE DRAWN IS UNCERTAIN. PLEASE NOTIFY THE MICROBIOLOGY DEPARTMENT WITHIN ONE WEEK IF SPECIATION AND SENSITIVITIES ARE REQUIRED. Performed at [facility], [address] [place], [place] [address]    Report Status 07/09/2024 FINAL  Final  Blood Culture ID Panel (Reflexed)     Status: Abnormal   Collection Time: 07/07/24  4:34 PM  Result Value Ref Range Status   Enterococcus faecalis NOT DETECTED NOT DETECTED Final   Enterococcus Faecium NOT DETECTED NOT DETECTED Final   Listeria monocytogenes NOT DETECTED NOT DETECTED Final   Staphylococcus species DETECTED (A) NOT DETECTED Final    Comment: CRITICAL RESULT CALLED TO, READ BACK BY AND VERIFIED WITH: PHARMD ABBY E. [id number] AT 1440, ADC    Staphylococcus aureus (BCID) NOT DETECTED NOT DETECTED Final   Staphylococcus epidermidis NOT DETECTED NOT DETECTED Final   Staphylococcus lugdunensis NOT DETECTED NOT DETECTED Final   Streptococcus species NOT DETECTED NOT DETECTED Final   Streptococcus agalactiae NOT DETECTED NOT DETECTED Final   Streptococcus pneumoniae NOT DETECTED NOT DETECTED Final   Streptococcus pyogenes NOT DETECTED NOT DETECTED Final   A.calcoaceticus-baumannii NOT DETECTED  NOT DETECTED Final   Bacteroides fragilis NOT DETECTED NOT DETECTED Final   Enterobacterales NOT DETECTED NOT DETECTED Final   Enterobacter cloacae complex NOT DETECTED NOT DETECTED Final   Escherichia coli NOT DETECTED NOT DETECTED Final   Klebsiella aerogenes NOT DETECTED NOT DETECTED Final   Klebsiella oxytoca NOT DETECTED NOT DETECTED Final   Klebsiella pneumoniae NOT DETECTED NOT DETECTED Final   Proteus species NOT DETECTED NOT DETECTED Final   Salmonella species NOT DETECTED NOT DETECTED Final   Serratia marcescens NOT DETECTED NOT DETECTED Final   Haemophilus influenzae NOT DETECTED NOT DETECTED Final  Neisseria meningitidis NOT DETECTED NOT DETECTED Final   Pseudomonas aeruginosa NOT DETECTED NOT DETECTED Final   Stenotrophomonas maltophilia NOT DETECTED NOT DETECTED Final   Candida albicans NOT DETECTED NOT DETECTED Final   Candida auris NOT DETECTED NOT DETECTED Final   Candida glabrata NOT DETECTED NOT DETECTED Final   Candida krusei NOT DETECTED NOT DETECTED Final   Candida parapsilosis NOT DETECTED NOT DETECTED Final   Candida tropicalis NOT DETECTED NOT DETECTED Final   Cryptococcus neoformans/gattii NOT DETECTED NOT DETECTED Final    Comment: Performed at [facility], [address] [place], [place] [address]  Urine Culture     Status: None   Collection Time: 07/07/24  5:12 PM   Specimen: Urine, Catheterized  Result Value Ref Range Status   Specimen Description   Final    URINE, CATHETERIZED Performed at [facility], [address], [place], [place] [address]    Special Requests   Final    NONE Performed at [facility], [address], [place], [place] [address]    Culture   Final    NO GROWTH Performed at [facility], [address], [place], [place] [address]    Report Status 07/08/2024 FINAL  Final  Resp panel by RT-PCR (RSV, Flu A&B, Covid) Anterior Nasal Swab     Status: None   Collection Time:  07/07/24  5:12 PM   Specimen: Anterior Nasal Swab  Result Value Ref Range Status   SARS Coronavirus 2 by RT PCR NEGATIVE NEGATIVE Final    Comment: (NOTE) SARS-CoV-2 target nucleic acids are NOT DETECTED.  The SARS-CoV-2 RNA is generally detectable in upper respiratory specimens during the acute phase of infection. The lowest concentration of SARS-CoV-2 viral copies this assay can detect is 138 copies/mL. A negative result does not preclude SARS-Cov-2 infection and should not be used as the sole basis for treatment or other patient management decisions. A negative result may occur with  improper specimen collection/handling, submission of specimen other than nasopharyngeal swab, presence of viral mutation(s) within the areas targeted by this assay, and inadequate number of viral copies(<138 copies/mL). A negative result must be combined with clinical observations, patient history, and epidemiological information. The expected result is Negative.  Fact Sheet for Patients:  BloggerCourse.com  Fact Sheet for Healthcare Providers:  SeriousBroker.it  This test is no t yet approved or cleared by the [place]  FDA and  has been authorized for detection and/or diagnosis of SARS-CoV-2 by FDA under an Emergency Use Authorization (EUA). This EUA will remain  in effect (meaning this test can be used) for the duration of the COVID-19 declaration under Section 564(b)(1) of the Act, 21 U.S.C.section 360bbb-3(b)(1), unless the authorization is terminated  or revoked sooner.       Influenza A by PCR NEGATIVE NEGATIVE Final   Influenza B by PCR NEGATIVE NEGATIVE Final    Comment: (NOTE) The Xpert Xpress SARS-CoV-2/FLU/RSV plus assay is intended as an aid in the diagnosis of influenza from Nasopharyngeal swab specimens and should not be used as a sole basis for treatment. Nasal washings and aspirates are unacceptable for Xpert Xpress  SARS-CoV-2/FLU/RSV testing.  Fact Sheet for Patients: BloggerCourse.com  Fact Sheet for Healthcare Providers: SeriousBroker.it  This test is not yet approved or cleared by the [place]  FDA and has been authorized for detection and/or diagnosis of SARS-CoV-2 by FDA under an Emergency Use Authorization (EUA). This EUA will remain in effect (meaning this test can  be used) for the duration of the COVID-19 declaration under Section 564(b)(1) of the Act, 21 U.S.C. section 360bbb-3(b)(1), unless the authorization is terminated or revoked.     Resp Syncytial Virus by PCR NEGATIVE NEGATIVE Final    Comment: (NOTE) Fact Sheet for Patients: BloggerCourse.com  Fact Sheet for Healthcare Providers: SeriousBroker.it  This test is not yet approved or cleared by the [place]  FDA and has been authorized for detection and/or diagnosis of SARS-CoV-2 by FDA under an Emergency Use Authorization (EUA). This EUA will remain in effect (meaning this test can be used) for the duration of the COVID-19 declaration under Section 564(b)(1) of the Act, 21 U.S.C. section 360bbb-3(b)(1), unless the authorization is terminated or revoked.  Performed at [facility], [address], [place], [place] [address]   Culture, blood (routine x 2)     Status: None   Collection Time: 07/07/24  5:24 PM   Specimen: BLOOD RIGHT FOREARM  Result Value Ref Range Status   Specimen Description BLOOD RIGHT FOREARM  Final   Special Requests   Final    BOTTLES DRAWN AEROBIC AND ANAEROBIC Blood Culture adequate volume   Culture   Final    NO GROWTH 5 DAYS Performed at [facility], [address] [place], [place] [address]    Report Status 07/12/2024 FINAL  Final     Labs: BNP (last 3 results) No results for input(s): BNP in the last 8760 hours. Basic Metabolic Panel: Recent Labs   Lab 07/07/24 1634 07/08/24 0354 07/09/24 0303 07/10/24 0326 07/11/24 0327  NA 135 137 136 138 137  K 3.5 3.5 3.7 3.9 4.0  CL 102 105 105 107 105  CO2 24 25 25 24 25   GLUCOSE 110* 120* 125* 100* 104*  BUN 13 12 13 11 12   CREATININE 0.76 0.75 0.57* 0.61 0.62  CALCIUM 8.4* 8.2* 8.1* 8.1* 8.2*   Liver Function Tests: Recent Labs  Lab 07/07/24 1634 07/09/24 0303  AST 39 26  ALT 12 7  ALKPHOS 112 105  BILITOT 0.8 0.5  PROT 5.8* 5.2*  ALBUMIN 2.4* 2.2*   Recent Labs  Lab 07/07/24 1634  LIPASE 21   No results for input(s): AMMONIA in the last 168 hours. CBC: Recent Labs  Lab 07/07/24 1634 07/08/24 0354 07/09/24 0303 07/10/24 0326 07/11/24 0327  WBC 7.6 7.7 7.7 8.3 8.9  NEUTROABS 5.2  --   --   --   --   HGB 11.8* 11.3* 11.5* 11.6* 11.7*  HCT 37.7* 36.4* 36.3* 37.4* 36.3*  MCV 94.3 95.8 96.3 96.4 95.5  PLT 119* 126* 152 157 157   Cardiac Enzymes: No results for input(s): CKTOTAL, CKMB, CKMBINDEX, TROPONINI in the last 168 hours. BNP: Invalid input(s): POCBNP CBG: No results for input(s): GLUCAP in the last 168 hours. D-Dimer No results for input(s): DDIMER in the last 72 hours. Hgb A1c No results for input(s): HGBA1C in the last 72 hours. Lipid Profile No results for input(s): CHOL, HDL, LDLCALC, TRIG, CHOLHDL, LDLDIRECT in the last 72 hours. Thyroid function studies No results for input(s): TSH, T4TOTAL, T3FREE, THYROIDAB in the last 72 hours.  Invalid input(s): FREET3 Anemia work up No results for input(s): VITAMINB12, FOLATE, FERRITIN, TIBC, IRON, RETICCTPCT in the last 72 hours. Urinalysis    Component Value Date/Time   COLORURINE YELLOW 07/07/2024 1712   APPEARANCEUR HAZY (A) 07/07/2024 1712   LABSPEC 1.017 07/07/2024 1712   PHURINE 5.0 07/07/2024 1712   GLUCOSEU NEGATIVE 07/07/2024 1712  HGBUR LARGE (A) 07/07/2024 1712   BILIRUBINUR NEGATIVE 07/07/2024 1712   KETONESUR NEGATIVE 07/07/2024  1712   PROTEINUR 30 (A) 07/07/2024 1712   UROBILINOGEN 0.2 05/26/2010 1229   NITRITE NEGATIVE 07/07/2024 1712   LEUKOCYTESUR LARGE (A) 07/07/2024 1712   Sepsis Labs Recent Labs  Lab 07/08/24 0354 07/09/24 0303 07/10/24 0326 07/11/24 0327  WBC 7.7 7.7 8.3 8.9   Microbiology Recent Results (from the past 240 hours)  Urine Culture     Status: Abnormal   Collection Time: 07/05/24  8:34 PM   Specimen: Urine, Clean Catch  Result Value Ref Range Status   Specimen Description   Final    URINE, CLEAN CATCH Performed at [facility], [address] [place], [place] [address]    Special Requests   Final    NONE Performed at [facility], [address] [place], [place] [address]    Culture (A)  Final    <10,000 COLONIES/mL >=100,000 COLONIES/mL Performed at [facility], [address] [place], [place] [address]    Report Status 07/07/2024 FINAL  Final  Culture, blood (routine x 2)     Status: Abnormal   Collection Time: 07/07/24  4:34 PM   Specimen: BLOOD  Result Value Ref Range Status   Specimen Description BLOOD SITE NOT SPECIFIED  Final   Special Requests   Final    BOTTLES DRAWN AEROBIC AND ANAEROBIC Blood Culture adequate volume   Culture  Setup Time   Final    GRAM POSITIVE COCCI AEROBIC BOTTLE ONLY CRITICAL RESULT CALLED TO, READ BACK BY AND VERIFIED WITH: PHARMD ABBY E. [id number] AT 1440, ADC    Culture (A)  Final    STAPHYLOCOCCUS HOMINIS THE SIGNIFICANCE OF ISOLATING THIS ORGANISM FROM A SINGLE SET OF BLOOD CULTURES WHEN MULTIPLE SETS ARE DRAWN IS UNCERTAIN. PLEASE NOTIFY THE MICROBIOLOGY DEPARTMENT WITHIN ONE WEEK IF SPECIATION AND SENSITIVITIES ARE REQUIRED. Performed at [facility], [address], [place], [place] [address]    Report Status 07/09/2024 FINAL  Final  Blood Culture ID Panel (Reflexed)     Status: Abnormal   Collection Time: 07/07/24  4:34 PM  Result Value Ref Range Status   Enterococcus  faecalis NOT DETECTED NOT DETECTED Final   Enterococcus Faecium NOT DETECTED NOT DETECTED Final   Listeria monocytogenes NOT DETECTED NOT DETECTED Final   Staphylococcus species DETECTED (A) NOT DETECTED Final    Comment: CRITICAL RESULT CALLED TO, READ BACK BY AND VERIFIED WITH: PHARMD ABBY E. [id number] AT 1440, ADC    Staphylococcus aureus (BCID) NOT DETECTED NOT DETECTED Final   Staphylococcus epidermidis NOT DETECTED NOT DETECTED Final   Staphylococcus lugdunensis NOT DETECTED NOT DETECTED Final   Streptococcus species NOT DETECTED NOT DETECTED Final   Streptococcus agalactiae NOT DETECTED NOT DETECTED Final   Streptococcus pneumoniae NOT DETECTED NOT DETECTED Final   Streptococcus pyogenes NOT DETECTED NOT DETECTED Final   A.calcoaceticus-baumannii NOT DETECTED NOT DETECTED Final   Bacteroides fragilis NOT DETECTED NOT DETECTED Final   Enterobacterales NOT DETECTED NOT DETECTED Final   Enterobacter cloacae complex NOT DETECTED NOT DETECTED Final   Escherichia coli NOT DETECTED NOT DETECTED Final   Klebsiella aerogenes NOT DETECTED NOT DETECTED Final   Klebsiella oxytoca NOT DETECTED NOT DETECTED Final   Klebsiella pneumoniae NOT DETECTED NOT DETECTED Final   Proteus species NOT DETECTED NOT DETECTED Final   Salmonella species NOT DETECTED NOT DETECTED Final   Serratia marcescens NOT DETECTED NOT DETECTED  Final   Haemophilus influenzae NOT DETECTED NOT DETECTED Final   Neisseria meningitidis NOT DETECTED NOT DETECTED Final   Pseudomonas aeruginosa NOT DETECTED NOT DETECTED Final   Stenotrophomonas maltophilia NOT DETECTED NOT DETECTED Final   Candida albicans NOT DETECTED NOT DETECTED Final   Candida auris NOT DETECTED NOT DETECTED Final   Candida glabrata NOT DETECTED NOT DETECTED Final   Candida krusei NOT DETECTED NOT DETECTED Final   Candida parapsilosis NOT DETECTED NOT DETECTED Final   Candida tropicalis NOT DETECTED NOT DETECTED Final   Cryptococcus neoformans/gattii NOT  DETECTED NOT DETECTED Final    Comment: Performed at [facility], [address] [place], [place] [address]  Urine Culture     Status: None   Collection Time: 07/07/24  5:12 PM   Specimen: Urine, Catheterized  Result Value Ref Range Status   Specimen Description   Final    URINE, CATHETERIZED Performed at [facility], [address], [place], [place] [address]    Special Requests   Final    NONE Performed at [facility], [address] [place], [place] [address]    Culture   Final    NO GROWTH Performed at [facility], [address] [place], [place] [address]    Report Status 07/08/2024 FINAL  Final  Resp panel by RT-PCR (RSV, Flu A&B, Covid) Anterior Nasal Swab     Status: None   Collection Time: 07/07/24  5:12 PM   Specimen: Anterior Nasal Swab  Result Value Ref Range Status   SARS Coronavirus 2 by RT PCR NEGATIVE NEGATIVE Final    Comment: (NOTE) SARS-CoV-2 target nucleic acids are NOT DETECTED.  The SARS-CoV-2 RNA is generally detectable in upper respiratory specimens during the acute phase of infection. The lowest concentration of SARS-CoV-2 viral copies this assay can detect is 138 copies/mL. A negative result does not preclude SARS-Cov-2 infection and should not be used as the sole basis for treatment or other patient management decisions. A negative result may occur with  improper specimen collection/handling, submission of specimen other than nasopharyngeal swab, presence of viral mutation(s) within the areas targeted by this assay, and inadequate number of viral copies(<138 copies/mL). A negative result must be combined with clinical observations, patient history, and epidemiological information. The expected result is Negative.  Fact Sheet for Patients:  BloggerCourse.com  Fact Sheet for Healthcare Providers:  SeriousBroker.it  This test is no t yet  approved or cleared by the [place]  FDA and  has been authorized for detection and/or diagnosis of SARS-CoV-2 by FDA under an Emergency Use Authorization (EUA). This EUA will remain  in effect (meaning this test can be used) for the duration of the COVID-19 declaration under Section 564(b)(1) of the Act, 21 U.S.C.section 360bbb-3(b)(1), unless the authorization is terminated  or revoked sooner.       Influenza A by PCR NEGATIVE NEGATIVE Final   Influenza B by PCR NEGATIVE NEGATIVE Final    Comment: (NOTE) The Xpert Xpress SARS-CoV-2/FLU/RSV plus assay is intended as an aid in the diagnosis of influenza from Nasopharyngeal swab specimens and should not be used as a sole basis for treatment. Nasal washings and aspirates are unacceptable for Xpert Xpress SARS-CoV-2/FLU/RSV testing.  Fact Sheet for Patients: BloggerCourse.com  Fact Sheet for Healthcare Providers: SeriousBroker.it  This test is not yet approved or cleared by the [place]  FDA and has been authorized for detection and/or diagnosis of SARS-CoV-2 by FDA under an Emergency Use  Authorization (EUA). This EUA will remain in effect (meaning this test can be used) for the duration of the COVID-19 declaration under Section 564(b)(1) of the Act, 21 U.S.C. section 360bbb-3(b)(1), unless the authorization is terminated or revoked.     Resp Syncytial Virus by PCR NEGATIVE NEGATIVE Final    Comment: (NOTE) Fact Sheet for Patients: BloggerCourse.com  Fact Sheet for Healthcare Providers: SeriousBroker.it  This test is not yet approved or cleared by the [place]  FDA and has been authorized for detection and/or diagnosis of SARS-CoV-2 by FDA under an Emergency Use Authorization (EUA). This EUA will remain in effect (meaning this test can be used) for the duration of the COVID-19 declaration under Section 564(b)(1) of  the Act, 21 U.S.C. section 360bbb-3(b)(1), unless the authorization is terminated or revoked.  Performed at [facility], [address], [place], [place] [address]   Culture, blood (routine x 2)     Status: None   Collection Time: 07/07/24  5:24 PM   Specimen: BLOOD RIGHT FOREARM  Result Value Ref Range Status   Specimen Description BLOOD RIGHT FOREARM  Final   Special Requests   Final    BOTTLES DRAWN AEROBIC AND ANAEROBIC Blood Culture adequate volume   Culture   Final    NO GROWTH 5 DAYS Performed at [facility], [address] [place], [place] [address]    Report Status 07/12/2024 FINAL  Final     Time coordinating discharge: Over 30 minutes  SIGNED:   Camellia PARAS [place], DO  [facility] 07/12/2024, 11:07 AM

## 2024-07-12 NOTE — Progress Notes (Signed)
 [facility] Liaison Note:   Notified by [facility] manager, Alfonse of family request for [facility] palliative services at home after discharge.   Hospital liaison will follow for discharge disposition.    Please call with any hospice our outpatient palliative care related questions.   Thank you for the opportunity to participate in this patient's care.   Eleanor Nail, LPN [facility] Liaison [phone]

## 2024-07-12 NOTE — Plan of Care (Signed)
  Problem: Elimination: Goal: Will not experience complications related to urinary retention Outcome: Progressing   Problem: Pain Managment: Goal: General experience of comfort will improve and/or be controlled Outcome: Progressing   Problem: Safety: Goal: Ability to remain free from injury will improve Outcome: Progressing

## 2024-07-20 ENCOUNTER — Emergency Department (HOSPITAL_COMMUNITY)
Admission: EM | Admit: 2024-07-20 | Discharge: 2024-07-21 | Disposition: A | Discharge status: Home / Self Care | Attending: Emergency Medicine | Admitting: Emergency Medicine

## 2024-07-20 ENCOUNTER — Other Ambulatory Visit: Payer: Self-pay

## 2024-07-20 ENCOUNTER — Encounter (HOSPITAL_COMMUNITY): Payer: Self-pay

## 2024-07-20 DIAGNOSIS — I700 Atherosclerosis of aorta: Secondary | ICD-10-CM | POA: Insufficient documentation

## 2024-07-20 DIAGNOSIS — N4342 Spermatocele of epididymis, multiple: Secondary | ICD-10-CM | POA: Insufficient documentation

## 2024-07-20 DIAGNOSIS — B372 Candidiasis of skin and nail: Secondary | ICD-10-CM | POA: Diagnosis not present

## 2024-07-20 DIAGNOSIS — R339 Retention of urine, unspecified: Secondary | ICD-10-CM | POA: Diagnosis present

## 2024-07-20 DIAGNOSIS — G20A1 Parkinson's disease without dyskinesia, without mention of fluctuations: Secondary | ICD-10-CM | POA: Insufficient documentation

## 2024-07-20 DIAGNOSIS — R338 Other retention of urine: Secondary | ICD-10-CM | POA: Diagnosis not present

## 2024-07-20 DIAGNOSIS — R109 Unspecified abdominal pain: Secondary | ICD-10-CM | POA: Insufficient documentation

## 2024-07-20 DIAGNOSIS — B379 Candidiasis, unspecified: Secondary | ICD-10-CM

## 2024-07-20 DIAGNOSIS — N401 Enlarged prostate with lower urinary tract symptoms: Secondary | ICD-10-CM | POA: Insufficient documentation

## 2024-07-20 DIAGNOSIS — I10 Essential (primary) hypertension: Secondary | ICD-10-CM | POA: Insufficient documentation

## 2024-07-20 DIAGNOSIS — K760 Fatty (change of) liver, not elsewhere classified: Secondary | ICD-10-CM | POA: Diagnosis not present

## 2024-07-20 NOTE — ED Triage Notes (Signed)
 PT BIB [facility] c/c of urinary retention. Was seen here for a UTI had a foley cath. inserted. PT went to urologist today and had the cath removed approx. 11:00am. PT's family stated that he hasn't been able to urinate, and has had 12 BM according to family.

## 2024-07-21 ENCOUNTER — Emergency Department (HOSPITAL_COMMUNITY)

## 2024-07-21 ENCOUNTER — Other Ambulatory Visit: Payer: Self-pay

## 2024-07-21 LAB — CBC WITH DIFFERENTIAL/PLATELET
Abs Immature Granulocytes: 0.09 K/uL — ABNORMAL HIGH (ref 0.00–0.07)
Basophils Absolute: 0 K/uL (ref 0.0–0.1)
Basophils Relative: 0 %
Eosinophils Absolute: 0 K/uL (ref 0.0–0.5)
Eosinophils Relative: 0 %
HCT: 38.3 % — ABNORMAL LOW (ref 39.0–52.0)
Hemoglobin: 12.5 g/dL — ABNORMAL LOW (ref 13.0–17.0)
Immature Granulocytes: 1 %
Lymphocytes Relative: 8 %
Lymphs Abs: 0.9 K/uL (ref 0.7–4.0)
MCH: 30.3 pg (ref 26.0–34.0)
MCHC: 32.6 g/dL (ref 30.0–36.0)
MCV: 93 fL (ref 80.0–100.0)
Monocytes Absolute: 0.8 K/uL (ref 0.1–1.0)
Monocytes Relative: 8 %
Neutro Abs: 8.4 K/uL — ABNORMAL HIGH (ref 1.7–7.7)
Neutrophils Relative %: 83 %
Platelets: 214 K/uL (ref 150–400)
RBC: 4.12 MIL/uL — ABNORMAL LOW (ref 4.22–5.81)
RDW: 12.3 % (ref 11.5–15.5)
WBC: 10.2 K/uL (ref 4.0–10.5)
nRBC: 0 % (ref 0.0–0.2)

## 2024-07-21 LAB — URINALYSIS, ROUTINE W REFLEX MICROSCOPIC
Bilirubin Urine: NEGATIVE
Glucose, UA: NEGATIVE mg/dL
Ketones, ur: NEGATIVE mg/dL
Leukocytes,Ua: NEGATIVE
Nitrite: NEGATIVE
Protein, ur: NEGATIVE mg/dL
Specific Gravity, Urine: 1.008 (ref 1.005–1.030)
pH: 5 (ref 5.0–8.0)

## 2024-07-21 LAB — COMPREHENSIVE METABOLIC PANEL WITH GFR
ALT: 5 U/L (ref 0–44)
AST: 16 U/L (ref 15–41)
Albumin: 3.7 g/dL (ref 3.5–5.0)
Alkaline Phosphatase: 99 U/L (ref 38–126)
Anion gap: 10 (ref 5–15)
BUN: 15 mg/dL (ref 8–23)
CO2: 24 mmol/L (ref 22–32)
Calcium: 9.6 mg/dL (ref 8.9–10.3)
Chloride: 97 mmol/L — ABNORMAL LOW (ref 98–111)
Creatinine, Ser: 0.65 mg/dL (ref 0.61–1.24)
GFR, Estimated: >60 mL/min (ref >60)
Glucose, Bld: 121 mg/dL — ABNORMAL HIGH (ref 70–99)
Potassium: 5 mmol/L (ref 3.5–5.1)
Sodium: 131 mmol/L — ABNORMAL LOW (ref 135–145)
Total Bilirubin: 1 mg/dL (ref 0.0–1.2)
Total Protein: 6.9 g/dL (ref 6.5–8.1)

## 2024-07-21 LAB — POC OCCULT BLOOD, ED: Fecal Occult Bld: NEGATIVE

## 2024-07-21 LAB — LIPASE, BLOOD: Lipase: 22 U/L (ref 11–51)

## 2024-07-21 MED ORDER — NYSTATIN 100000 UNIT/GM EX CREA: TOPICAL_CREAM | CUTANEOUS | 0 refills | Status: AC

## 2024-07-21 MED ORDER — LACTATED RINGERS IV BOLUS
1000.0000 mL | Freq: Once | INTRAVENOUS | Status: AC
  Administered 2024-07-21: 1000 mL via INTRAVENOUS

## 2024-07-21 MED ORDER — IOHEXOL 300 MG/ML  SOLN
100.0000 mL | Freq: Once | INTRAMUSCULAR | Status: AC | PRN
  Administered 2024-07-21: 100 mL via INTRAVENOUS

## 2024-07-21 NOTE — ED Provider Notes (Signed)
 [place] EMERGENCY DEPARTMENT AT [facility] Provider Note   CSN: [id number] Arrival date & time: 07/20/24  2343     Patient presents with: Urinary Retention   Jeremiah Zimmerman is a 89 y.o. male.   Patient with a history of Parkinson's disease, hypertension, heart murmur, mitral valve prolapse, BPH, small bowel obstruction, recent admission for complicated UTI here with urinary retention.  Family reports Foley catheter was removed at 11 AM September 4 at urologist office.  He has only been able to urinate small amounts since then.  He is having severe lower abdominal pain with urge to urinate.  Denies fever, chills, nausea, vomiting, chest pain or shortness of breath.  Family reports he has had about 12 loose stools today.  That have been nonbloody.  They are concerned for C. difficile.  He did complete his antibiotics this morning.  He has been on multiple antibiotics recently for UTI.  Diarrhea is new today and is not black or bloody.  He denies vomiting or fever. He was prescribed 2 weeks of cefadroxil  which he finished this morning. Family also concerned about substantial testicular pain with attempted catheter care.  The history is provided by the patient and the EMS personnel.       Prior to Admission medications   Medication Sig Start Date End Date Taking? Authorizing Provider  AMBULATORY NON FORMULARY MEDICATION Lift Chair Dx:  G20 04/30/21   TatAsberry RAMAN, DO  AMBULATORY NON FORMULARY MEDICATION Rollator Dx: G20 04/30/21   TatAsberry RAMAN, DO  Carbidopa -Levodopa  ER (SINEMET  CR) 25-100 MG tablet controlled release Take 2 tablets by mouth 3 (three) times daily. 8am/noon/4pm Patient taking differently: Take 2 tablets by mouth 3 (three) times daily. Take 2 tablets by mouth before breakfast & supper and 1 tablet before lunch 02/10/24   Tat, Asberry RAMAN, DO  Multiple Vitamin (MULTIVITAMIN) capsule Take 1 capsule by mouth daily with breakfast.    [provider]   Tamsulosin  HCl (FLOMAX ) 0.4 MG CAPS Take 0.4 mg by mouth in the morning.    [provider]  TYLENOL  CHILDRENS 160 MG/5ML suspension Take 480 mg by mouth every 6 (six) hours as needed for fever or mild pain (pain score 1-3) (or discomfort).    [provider]    Allergies: Erythromycin, Nitroglycerin, Sulfa antibiotics, Tetracyclines & related, Amoxicillin, and Penicillins    Review of Systems  Constitutional:  Negative for activity change, appetite change and fever.  HENT:  Negative for congestion and rhinorrhea.   Respiratory:  Negative for cough, chest tightness and shortness of breath.   Cardiovascular:  Negative for chest pain.  Gastrointestinal:  Positive for diarrhea. Negative for abdominal pain, nausea and vomiting.  Genitourinary:  Positive for difficulty urinating and dysuria.  Musculoskeletal:  Negative for arthralgias and myalgias.  Skin:  Negative for rash.  Neurological:  Negative for dizziness, weakness and headaches.   all other systems are negative except as noted in the HPI and PMH.    Updated Vital Signs BP 127/68 (BP Location: Right Arm)   Pulse 85   Temp 98 F (36.7 C) (Oral)   Resp 18   SpO2 94%   Physical Exam Vitals and nursing note reviewed.  Constitutional:      General: He is not in acute distress.    Appearance: He is well-developed.  HENT:     Head: Normocephalic and atraumatic.     Mouth/Throat:     Pharynx: No oropharyngeal exudate.  Eyes:  Conjunctiva/sclera: Conjunctivae normal.     Pupils: Pupils are equal, round, and reactive to light.  Neck:     Comments: No meningismus. Cardiovascular:     Rate and Rhythm: Normal rate and regular rhythm.     Heart sounds: Normal heart sounds. No murmur heard. Pulmonary:     Effort: Pulmonary effort is normal. No respiratory distress.     Breath sounds: Normal breath sounds.  Abdominal:     Palpations: Abdomen is soft.     Tenderness: There is abdominal tenderness. There is no  guarding or rebound.     Comments: Suprapubic tenderness  Genitourinary:    Comments: No fecal impaction  Erythematous Candida type rash to scrotum and perineum Musculoskeletal:        General: No tenderness. Normal range of motion.     Cervical back: Normal range of motion and neck supple.  Skin:    General: Skin is warm.  Neurological:     Mental Status: He is alert and oriented to person, place, and time.     Cranial Nerves: No cranial nerve deficit.     Motor: No abnormal muscle tone.     Coordination: Coordination normal.     Comments:  5/5 strength throughout. CN 2-12 intact.Equal grip strength.   Psychiatric:        Behavior: Behavior normal.     (all labs ordered are listed, but only abnormal results are displayed) Labs Reviewed  URINALYSIS, ROUTINE W REFLEX MICROSCOPIC - Abnormal; Notable for the following components:      Result Value   Hgb urine dipstick MODERATE (*)    Bacteria, UA RARE (*)    All other components within normal limits  CBC WITH DIFFERENTIAL/PLATELET - Abnormal; Notable for the following components:   RBC 4.12 (*)    Hemoglobin 12.5 (*)    HCT 38.3 (*)    Neutro Abs 8.4 (*)    Abs Immature Granulocytes 0.09 (*)    All other components within normal limits  COMPREHENSIVE METABOLIC PANEL WITH GFR - Abnormal; Notable for the following components:   Sodium 131 (*)    Chloride 97 (*)    Glucose, Bld 121 (*)    All other components within normal limits  URINE CULTURE  C DIFFICILE QUICK SCREEN W PCR REFLEX    GASTROINTESTINAL PANEL BY PCR, STOOL (REPLACES STOOL CULTURE)  LIPASE, BLOOD  POC OCCULT BLOOD, ED    EKG: None  Radiology: US  SCROTUM W/DOPPLER Result Date: 07/21/2024 CLINICAL DATA:  Testicular pain EXAM: SCROTAL ULTRASOUND DOPPLER ULTRASOUND OF THE TESTICLES TECHNIQUE: Complete ultrasound examination of the testicles, epididymis, and other scrotal structures was performed. Color and spectral Doppler ultrasound were also utilized to  evaluate blood flow to the testicles. COMPARISON:  None Available. FINDINGS: Right testicle Measurements: 3.0 x 1.5 x 2.4 cm. No mass or microlithiasis visualized. Normal color flow vascularity. Left testicle Measurements: 2.6 x 1.4 x 2.4 cm. No mass or microlithiasis visualized. Normal color flow vascularity. Right epididymis: Normal in size and appearance. Multiple epididymal cysts are identified measuring up to 10 mm. Left epididymis:  Normal in size and appearance. Hydrocele:  None visualized. Varicocele:  None visualized. Pulsed Doppler interrogation of both testes demonstrates normal low resistance arterial and venous waveforms bilaterally. IMPRESSION: 1. Normal testicular sonogram. Electronically Signed   By: Dorethia Molt M.D.   On: 07/21/2024 03:40   CT ABDOMEN PELVIS W CONTRAST Result Date: 07/21/2024 CLINICAL DATA:  Acute nonlocalized abdominal pain EXAM: CT ABDOMEN AND PELVIS WITH  CONTRAST TECHNIQUE: Multidetector CT imaging of the abdomen and pelvis was performed using the standard protocol following bolus administration of intravenous contrast. RADIATION DOSE REDUCTION: This exam was performed according to the departmental dose-optimization program which includes automated exposure control, adjustment of the mA and/or kV according to patient size and/or use of iterative reconstruction technique. CONTRAST:  OMNIPAQUE  IOHEXOL  300 MG/ML  SOLN COMPARISON:  07/07/2024 FINDINGS: Lower chest: No acute abnormality. Hepatobiliary: Mild hepatic steatosis no focal liver abnormality is seen. Status post cholecystectomy. No biliary dilatation. Pancreas: Unremarkable Spleen: Unremarkable Adrenals/Urinary Tract: Adrenal glands are unremarkable. Simple cortical and parapelvic cysts are seen within the kidneys bilaterally for which no follow-up imaging is recommended. The kidneys are otherwise unremarkable. Bladder is decompressed with a Foley catheter seen within its lumen. Thickening of the bladder wall is  present, similar prior examination likely representing changes of chronic bladder outlet obstruction. Stomach/Bowel: Stomach is within normal limits. Appendix absent. No evidence of bowel wall thickening, distention, or inflammatory changes. Vascular/Lymphatic: Aortic atherosclerosis. No enlarged abdominal or pelvic lymph nodes. Reproductive: Marked prostatic hypertrophy Other: No abdominal wall hernia Musculoskeletal: Remote superior endplate fracture of L2. No acute bone abnormality. No lytic or blastic lesion. IMPRESSION: 1. No acute intra-abdominal pathology identified. No definite radiographic explanation for the patient's reported symptoms. 2. Mild hepatic steatosis. 3. Marked prostatic hypertrophy. Thickening of the bladder wall, similar prior examination, likely representing changes of chronic bladder outlet obstruction. Foley catheter in place. 4. Aortic atherosclerosis. Aortic Atherosclerosis (ICD10-I70.0). Electronically Signed   By: Dorethia Molt M.D.   On: 07/21/2024 03:37     Procedures   Medications Ordered in the ED - No data to display                                  Medical Decision Making Amount and/or Complexity of Data Reviewed Labs: ordered. Decision-making details documented in ED Course. Radiology: ordered and independent interpretation performed. Decision-making details documented in ED Course. ECG/medicine tests: ordered and independent interpretation performed. Decision-making details documented in ED Course.  Risk Prescription drug management.   Patient with lower abdominal pain, urinary retention after Foley catheter removed earlier today.  Minimal urine output since 11 AM.  Stable vitals.  No distress.  Abdomen tender to the suprapubic area.  Also having diarrhea today which is new and family concern for C. difficile.  Vitals are stable.  No distress.  Bladder scan shows over 300 cc of urine.  Foley catheter replaced.  Patient feels improved.  Urinalysis not  consistent with infection.  Will send culture. Creatinine at baseline  Foley catheter placed and patient feels improved.  Creatinine is at baseline. Patient and family concerned about testicular discomfort when he tries to have catheter care.  He does have erythematous testicles and Candida rash involving his perineum and anus and scrotum.  No crepitus.  Low concern for Fournier's gangrene.  Ultrasound shows normal blood flow to testicles bilaterally.  No evidence of torsion.  CT scan shows chronic thickening of bladder wall consistent with chronic bladder outlet obstruction. Enlarged prostate.  No other acute findings.  On recheck patient feels improved.  Urinary catheter draining appropriately.  He has not had any bowel movements throughout ED course.  Stool sample unable to be obtained.  No fecal impaction on exam.  Hemoccult is negative  Appears Stable for discharge with outpatient follow-up with urology for voiding trial. Urine culture pending.  Did just  complete antibiotics today.  Follow-up with PCP as well as urology.  Return to the ED with worsening abdominal pain, fever, vomiting, unable to urinate or other concerns.    Final diagnoses:  Urinary retention  Candida infection    ED Discharge Orders     None          Camiah Humm, [place], MD 07/21/24 [phone]

## 2024-07-21 NOTE — Discharge Instructions (Signed)
 Keep catheter in place until you see urology in the office next week.  Apply the antifungal cream to the red areas involving the scrotum, thighs and buttocks.  Follow-up with your primary doctor.  Return to the ED with worsening abdominal pain, fever, vomiting, unable to urinate or other concerns.

## 2024-07-22 LAB — URINE CULTURE: Culture: NO GROWTH

## 2024-07-25 ENCOUNTER — Ambulatory Visit: Admitting: Neurology

## 2024-08-05 ENCOUNTER — Other Ambulatory Visit: Payer: Self-pay | Admitting: Neurology

## 2024-08-05 DIAGNOSIS — G20A1 Parkinson's disease without dyskinesia, without mention of fluctuations: Secondary | ICD-10-CM

## 2024-08-07 ENCOUNTER — Other Ambulatory Visit (HOSPITAL_COMMUNITY): Payer: Self-pay

## 2024-08-07 MED ORDER — NITROFURANTOIN MONOHYD MACRO 100 MG PO CAPS
100.0000 mg | ORAL_CAPSULE | Freq: Two times a day (BID) | ORAL | 0 refills | Status: AC
  Filled 2024-08-07: qty 14, 7d supply, fill #0

## 2024-08-07 MED ORDER — ONDANSETRON 4 MG PO TBDP
4.0000 mg | ORAL_TABLET | Freq: Three times a day (TID) | ORAL | 0 refills | Status: AC
  Filled 2024-08-07: qty 30, 10d supply, fill #0

## 2024-08-07 MED ORDER — FOSFOMYCIN TROMETHAMINE 3 G PO PACK
3.0000 g | PACK | ORAL | 0 refills | Status: AC
  Filled 2024-08-07: qty 3, 3d supply, fill #0

## 2024-09-26 ENCOUNTER — Other Ambulatory Visit: Payer: Self-pay | Admitting: Urology

## 2024-10-03 ENCOUNTER — Other Ambulatory Visit: Payer: Self-pay | Admitting: Urology

## 2024-10-03 DIAGNOSIS — N401 Enlarged prostate with lower urinary tract symptoms: Secondary | ICD-10-CM

## 2024-10-06 ENCOUNTER — Other Ambulatory Visit: Payer: Self-pay | Admitting: Interventional Radiology

## 2024-10-06 ENCOUNTER — Ambulatory Visit
Admission: RE | Admit: 2024-10-06 | Discharge: 2024-10-06 | Disposition: A | Discharge status: Home / Self Care | Source: Ambulatory Visit | Attending: Urology | Admitting: Urology

## 2024-10-06 DIAGNOSIS — N401 Enlarged prostate with lower urinary tract symptoms: Secondary | ICD-10-CM

## 2024-10-06 NOTE — Consult Note (Signed)
 Chief Complaint: Patient was seen in consultation today for BPH with LUTS at the request of Wrenn,John  Referring Physician(s): Wrenn,John  History of Present Illness: Jeremiah Zimmerman is a 89 y.o. male h/o Parkinsons with decades-long history of BPH with only urgency. However, after a recent UTI he remains Foley dependent and has failed multiple voiding trials. History of bladder CA 15 yr ago without recurrence. QoL score 4. IPSS deferred due to indwelling Foley. No renal insufficiency.   Past Medical History:  Diagnosis Date   Adenomatous colon polyp    Bladder cancer (HCC) 2010   BPH (benign prostatic hyperplasia)    Heart murmur    Hyperlipidemia    Hypertension    MVP (mitral valve prolapse) 01-13-13   hx. LBBB previous EKG 11'11  in Epic.   Parkinson's disease (HCC)    SBO (small bowel obstruction) (HCC) 07/18/2018   Seasonal allergies 01-13-13    Past Surgical History:  Procedure Laterality Date   APPENDECTOMY     BLADDER SURGERY  2010   bladder cancer in 2010/Post surgery bleed   CARDIAC CATHETERIZATION  01-13-13   greater than 5 yrs ago-negative findings   CHOLECYSTECTOMY N/A 01/27/2017   Procedure: LAPAROSCOPIC CHOLECYSTECTOMY WITH INTRAOPERATIVE CHOLANGIOGRAM;  Surgeon: Donnice Bury, MD;  Location: [facility] OR;  Service: General;  Laterality: N/A;   COLONOSCOPY  01-13-13   hx.colon polyps excised-last 3-4 yrs ago   ELBOW SURGERY     left   KNEE ARTHROSCOPY WITH MEDIAL MENISECTOMY Right 01/20/2013   Procedure: KNEE ARTHROSCOPY WITH MEDIAL MENISECTOMY;  Surgeon: Dempsey LULLA Moan, MD;  Location: [facility] ORS;  Service: Orthopedics;  Laterality: Right;  right knee arthroscopy with medial meniscal debridement and chrondroplasty   TONSILLECTOMY     TONSILLECTOMY AND ADENOIDECTOMY      Allergies: Erythromycin, Nitroglycerin, Sulfa antibiotics, Tetracyclines & related, Amoxicillin, and Penicillins  Medications: Prior to Admission medications   Medication Sig Start Date End  Date Taking? Authorizing Provider  AMBULATORY NON FORMULARY MEDICATION Lift Chair Dx:  G20 04/30/21   TatAsberry RAMAN, DO  AMBULATORY NON FORMULARY MEDICATION Rollator Dx: G20 04/30/21   Tat, Asberry RAMAN, DO  Carbidopa -Levodopa  ER (SINEMET  CR) 25-100 MG tablet controlled release TAKE 2 TABLETS BY MOUTH 3 (THREE) TIMES DAILY. 8AM/NOON/4PM 08/07/24   Tat, Asberry RAMAN, DO  fosfomycin (MONUROL ) 3 g PACK Take 3 g by mouth every 3 (three) days. **DISSOLVE IN WATER BEFORE TAKING.** 08/07/24     Multiple Vitamin (MULTIVITAMIN) capsule Take 1 capsule by mouth daily with breakfast.    [provider]  nitrofurantoin , macrocrystal-monohydrate, (MACROBID ) 100 MG capsule Take 1 capsule (100 mg total) by mouth 2 (two) times daily. 08/07/24     nystatin  cream (MYCOSTATIN ) Apply to affected area 2 times daily 07/21/24   Rancour, Garnette, MD  ondansetron  (ZOFRAN -ODT) 4 MG disintegrating tablet Dissolve 1 tablet (4 mg total) by mouth every 8 (eight) hours. 08/07/24     Tamsulosin  HCl (FLOMAX ) 0.4 MG CAPS Take 0.4 mg by mouth in the morning.    [provider]  TYLENOL  CHILDRENS 160 MG/5ML suspension Take 480 mg by mouth every 6 (six) hours as needed for fever or mild pain (pain score 1-3) (or discomfort).    [provider]     Family History  Problem Relation Age of Onset   Breast cancer Mother    Prostate cancer Father    Colon cancer Brother    Cervical cancer Daughter    Thyroid cancer Daughter  Uterine cancer Daughter    Prostate cancer Brother    Alzheimer's disease Sister    Alzheimer's disease Sister    Healthy Son     Social History   Socioeconomic History   Marital status: Married    Spouse name: Not on file   Number of children: Not on file   Years of education: Not on file   Highest education level: Not on file  Occupational History   Not on file  Tobacco Use   Smoking status: Never   Smokeless tobacco: Never  Vaping Use   Vaping status: Never Used  Substance and  Sexual Activity   Alcohol use: No   Drug use: No   Sexual activity: Not Currently  Other Topics Concern   Not on file  Social History Narrative   Right Handed    Lives in one story home with wife    Social Drivers of Health   Financial Resource Strain: Not on file  Food Insecurity: No Food Insecurity (07/08/2024)   Hunger Vital Sign    Worried About Running Out of Food in the Last Year: Never true    Ran Out of Food in the Last Year: Never true  Transportation Needs: No Transportation Needs (07/08/2024)   PRAPARE - Administrator, Civil Service (Medical): No    Lack of Transportation (Non-Medical): No  Physical Activity: Not on file  Stress: Not on file  Social Connections: Moderately Isolated (07/08/2024)   Social Connection and Isolation Panel    Frequency of Communication with Friends and Family: Twice a week    Frequency of Social Gatherings with Friends and Family: Three times a week    Attends Religious Services: Patient unable to answer    Active Member of Clubs or Organizations: No    Attends Banker Meetings: Never    Marital Status: Married    ECOG Status: 1 - Symptomatic but completely ambulatory  Review of Systems: A 12 point ROS discussed and pertinent positives are indicated in the HPI above.  All other systems are negative.  Review of Systems  Vital Signs: BP 136/69 (BP Location: Left Arm)   Pulse 84   Temp (!) 97.3 F (36.3 C)   Resp 20   SpO2 96%       Physical Exam Constitutional: Oriented to person, place, and time. Well-developed and well-nourished. No distress.   HENT:  Head: Normocephalic and atraumatic.  Eyes: Conjunctivae and EOM are normal. Right eye exhibits no discharge. Left eye exhibits no discharge. No scleral icterus.  Neck: No JVD present.  Pulmonary/Chest: Effort normal. No stridor. No respiratory distress.  Abdomen: soft, non distended Neurological:  alert and oriented to person, place, and time.  Bradykinesia. Skin: Skin is warm and dry.  not diaphoretic.  Psychiatric:   normal mood and affect.   behavior is normal. Judgment and thought content normal.      Imaging: CT ABDOMEN AND PELVIS WITH CONTRAST   TECHNIQUE: Multidetector CT imaging of the abdomen and pelvis was performed using the standard protocol following bolus administration of intravenous contrast.   RADIATION DOSE REDUCTION: This exam was performed according to the departmental dose-optimization program which includes automated exposure control, adjustment of the mA and/or kV according to patient size and/or use of iterative reconstruction technique.   CONTRAST:  OMNIPAQUE  IOHEXOL  300 MG/ML  SOLN   COMPARISON:  07/07/2024   FINDINGS: Lower chest: No acute abnormality.   Hepatobiliary: Mild hepatic steatosis no focal liver  abnormality is seen. Status post cholecystectomy. No biliary dilatation.   Pancreas: Unremarkable   Spleen: Unremarkable   Adrenals/Urinary Tract: Adrenal glands are unremarkable. Simple cortical and parapelvic cysts are seen within the kidneys bilaterally for which no follow-up imaging is recommended. The kidneys are otherwise unremarkable. Bladder is decompressed with a Foley catheter seen within its lumen. Thickening of the bladder wall is present, similar prior examination likely representing changes of chronic bladder outlet obstruction.   Stomach/Bowel: Stomach is within normal limits. Appendix absent. No evidence of bowel wall thickening, distention, or inflammatory changes.   Vascular/Lymphatic: Aortic atherosclerosis. No enlarged abdominal or pelvic lymph nodes.   Reproductive: Marked prostatic hypertrophy   Other: No abdominal wall hernia   Musculoskeletal: Remote superior endplate fracture of L2. No acute bone abnormality. No lytic or blastic lesion.   IMPRESSION: 1. No acute intra-abdominal pathology identified. No definite radiographic explanation for  the patient's reported symptoms. 2. Mild hepatic steatosis. 3. Marked prostatic hypertrophy. Thickening of the bladder wall, similar prior examination, likely representing changes of chronic bladder outlet obstruction. Foley catheter in place. 4. Aortic atherosclerosis.   Aortic Atherosclerosis (ICD10-I70.0).     Electronically Signed   By: Dorethia Molt M.D.   On: 07/21/2024 03:37  Labs:  CBC: Recent Labs    07/09/24 0303 07/10/24 0326 07/11/24 0327 07/21/24 0050  WBC 7.7 8.3 8.9 10.2  HGB 11.5* 11.6* 11.7* 12.5*  HCT 36.3* 37.4* 36.3* 38.3*  PLT 152 157 157 214    COAGS: No results for input(s): INR, APTT in the last 8760 hours.  BMP: Recent Labs    07/09/24 0303 07/10/24 0326 07/11/24 0327 07/21/24 0050  NA 136 138 137 131*  K 3.7 3.9 4.0 5.0  CL 105 107 105 97*  CO2 25 24 25 24   GLUCOSE 125* 100* 104* 121*  BUN 13 11 12 15   CALCIUM 8.1* 8.1* 8.2* 9.6  CREATININE 0.57* 0.61 0.62 0.65  GFRNONAA >60 >60 >60 >60    LIVER FUNCTION TESTS: Recent Labs    04/05/24 1121 07/07/24 1634 07/09/24 0303 07/21/24 0050  BILITOT 1.1 0.8 0.5 1.0  AST 16 39 26 16  ALT 7 12 7 5   ALKPHOS 72 112 105 99  PROT 6.9 5.8* 5.2* 6.9  ALBUMIN 3.7 2.4* 2.2* 3.7    TUMOR MARKERS: No results for input(s): AFPTM, CEA, CA199, CHROMGRNA in the last 8760 hours.  Assessment and Plan:   My impression is that this patient with benign prostatic hypertrophy has persistent lower urinary tract symptoms resulting in significant degradation in quality of life due to catheter dependence.  Based on his evaluation thus far, he would be an appropriate candidate for consideration of prostate artery embolization for durable long-term control of his symptoms, hopefully avoid chronic catheterization. We discussed the PAE procedure, technique, anticipated benefits, alternatives,  time course of symptom resolution, possible risks and complications.  He and his 2 daughters seemed to  understand and  did ask appropriate questions which were answered.  He is motivated to proceed.  Will get a CTA pelvis to assess the anatomy and ensure there are no significant anatomic contraindications.   Assuming this looks good, we can set him up for elective bilateral prostate artery embolization under moderate sedation as an outpatient at [facility].   Thank you for this interesting consult.  I greatly enjoyed meeting Jeremiah Zimmerman and look forward to participating in their care.  A copy of this report was sent to the requesting provider  on this date.  Electronically Signed: Dayne Milli Woolridge 10/06/2024, 4:52 PM   I spent a total of  40 Minutes   in face to face in clinical consultation, greater than 50% of which was counseling/coordinating care for LUTS and BPH.

## 2024-10-11 ENCOUNTER — Other Ambulatory Visit: Payer: Self-pay | Admitting: Interventional Radiology

## 2024-10-11 ENCOUNTER — Ambulatory Visit
Admission: RE | Admit: 2024-10-11 | Discharge: 2024-10-11 | Disposition: A | Discharge status: Home / Self Care | Source: Ambulatory Visit | Attending: Interventional Radiology | Admitting: Interventional Radiology

## 2024-10-11 DIAGNOSIS — N401 Enlarged prostate with lower urinary tract symptoms: Secondary | ICD-10-CM

## 2024-10-27 ENCOUNTER — Ambulatory Visit (HOSPITAL_COMMUNITY)
Admission: RE | Admit: 2024-10-27 | Discharge: 2024-10-27 | Attending: Interventional Radiology | Admitting: Interventional Radiology

## 2024-10-27 DIAGNOSIS — N401 Enlarged prostate with lower urinary tract symptoms: Secondary | ICD-10-CM

## 2024-10-27 MED ORDER — IOHEXOL 350 MG/ML SOLN
100.0000 mL | Freq: Once | INTRAVENOUS | Status: AC | PRN
  Administered 2024-10-27: 100 mL via INTRAVENOUS

## 2024-10-27 MED ORDER — SODIUM CHLORIDE (PF) 0.9 % IJ SOLN
INTRAMUSCULAR | Status: AC
  Filled 2024-10-27: qty 50

## 2024-11-08 ENCOUNTER — Other Ambulatory Visit: Payer: Self-pay | Admitting: Neurology

## 2024-11-08 DIAGNOSIS — G20A1 Parkinson's disease without dyskinesia, without mention of fluctuations: Secondary | ICD-10-CM

## 2024-11-23 ENCOUNTER — Other Ambulatory Visit (HOSPITAL_COMMUNITY): Payer: Self-pay | Admitting: Interventional Radiology

## 2024-11-23 DIAGNOSIS — N401 Enlarged prostate with lower urinary tract symptoms: Secondary | ICD-10-CM

## 2024-12-05 ENCOUNTER — Other Ambulatory Visit (HOSPITAL_COMMUNITY): Payer: Self-pay | Admitting: Radiology

## 2024-12-05 DIAGNOSIS — C679 Malignant neoplasm of bladder, unspecified: Secondary | ICD-10-CM

## 2024-12-05 NOTE — H&P (Incomplete)
 "     Chief Complaint: Patient was seen in consultation today for BPH with LUTS  Referring Physician(s): Dr. Norleen Seltzer  Supervising Physician: Johann Sieving  Patient Status: [facility] - In-pt  History of Present Illness: Jeremiah Zimmerman is a 89 y.o. male with h/o Parkinsons with decades-long history of BPH previously characterized by urgency.  After a recent UTI he became foley dependent and has been unable to successfully accomplish voiding trials. He recently met with Dr. Johann in consultation 10/06/24 to discuss proceeding with prostate artery embolization.   He has elected to proceed and presents to [facility] for procedure today.   Jeremiah Zimmerman presents in his usual state of health.  He does have a foley in place today which is uncomfortable.  He is NPO.   He does not take blood thinners.   He has transportation and post-procedure care arranged.   He is DNR-LIMITED and states he would want medical reversal of sedating affects, but confirms no chest compressions or intubation.   Past Medical History:  Diagnosis Date   Adenomatous colon polyp    Bladder cancer (HCC) 2010   BPH (benign prostatic hyperplasia)    Heart murmur    Hyperlipidemia    Hypertension    MVP (mitral valve prolapse) 01-13-13   hx. LBBB previous EKG 11'11  in Epic.   Parkinson's disease (HCC)    SBO (small bowel obstruction) (HCC) 07/18/2018   Seasonal allergies 01-13-13    Past Surgical History:  Procedure Laterality Date   APPENDECTOMY     BLADDER SURGERY  2010   bladder cancer in 2010/Post surgery bleed   CARDIAC CATHETERIZATION  01-13-13   greater than 5 yrs ago-negative findings   CHOLECYSTECTOMY N/A 01/27/2017   Procedure: LAPAROSCOPIC CHOLECYSTECTOMY WITH INTRAOPERATIVE CHOLANGIOGRAM;  Surgeon: Donnice Bury, MD;  Location: [facility] OR;  Service: General;  Laterality: N/A;   COLONOSCOPY  01-13-13   hx.colon polyps excised-last 3-4 yrs ago   ELBOW SURGERY     left   KNEE ARTHROSCOPY WITH MEDIAL  MENISECTOMY Right 01/20/2013   Procedure: KNEE ARTHROSCOPY WITH MEDIAL MENISECTOMY;  Surgeon: Dempsey LULLA Moan, MD;  Location: [facility] ORS;  Service: Orthopedics;  Laterality: Right;  right knee arthroscopy with medial meniscal debridement and chrondroplasty   TONSILLECTOMY     TONSILLECTOMY AND ADENOIDECTOMY      Allergies: Erythromycin, Nitroglycerin , Sulfa antibiotics, Tetracyclines & related, Amoxicillin, and Penicillins  Medications: Prior to Admission medications  Medication Sig Start Date End Date Taking? Authorizing Provider  AMBULATORY NON FORMULARY MEDICATION Lift Chair Dx:  G20 04/30/21   TatAsberry RAMAN, DO  AMBULATORY NON FORMULARY MEDICATION Rollator Dx: G20 04/30/21   TatAsberry RAMAN, DO  Carbidopa -Levodopa  ER (SINEMET  CR) 25-100 MG tablet controlled release TAKE 2 TABLETS BY MOUTH 3 (THREE) TIMES DAILY. 8AM/NOON/4PM 11/08/24   Leigh Venetia CROME, MD  fosfomycin  (MONUROL ) 3 g PACK Take 3 g by mouth every 3 (three) days. **DISSOLVE IN WATER BEFORE TAKING.** 08/07/24     Multiple Vitamin (MULTIVITAMIN) capsule Take 1 capsule by mouth daily with breakfast.    [provider]  nitrofurantoin , macrocrystal-monohydrate, (MACROBID ) 100 MG capsule Take 1 capsule (100 mg total) by mouth 2 (two) times daily. 08/07/24     nystatin  cream (MYCOSTATIN ) Apply to affected area 2 times daily 07/21/24   Rancour, Garnette, MD  ondansetron  (ZOFRAN -ODT) 4 MG disintegrating tablet Dissolve 1 tablet (4 mg total) by mouth every 8 (eight) hours. 08/07/24     Tamsulosin  HCl (FLOMAX ) 0.4 MG  CAPS Take 0.4 mg by mouth in the morning.    [provider]  TYLENOL  CHILDRENS 160 MG/5ML suspension Take 480 mg by mouth every 6 (six) hours as needed for fever or mild pain (pain score 1-3) (or discomfort).    [provider]     Family History  Problem Relation Age of Onset   Breast cancer Mother    Prostate cancer Father    Colon cancer Brother    Cervical cancer Daughter    Thyroid cancer Daughter     Uterine cancer Daughter    Prostate cancer Brother    Alzheimer's disease Sister    Alzheimer's disease Sister    Healthy Son     Social History   Socioeconomic History   Marital status: Married    Spouse name: Not on file   Number of children: Not on file   Years of education: Not on file   Highest education level: Not on file  Occupational History   Not on file  Tobacco Use   Smoking status: Never    Passive exposure: Never   Smokeless tobacco: Never  Vaping Use   Vaping status: Never Used  Substance and Sexual Activity   Alcohol use: No   Drug use: No   Sexual activity: Not Currently  Other Topics Concern   Not on file  Social History Narrative   Right Handed    Lives in one story home with wife    Social Drivers of Health   Tobacco Use: Low Risk (12/06/2024)   Patient History    Smoking Tobacco Use: Never    Smokeless Tobacco Use: Never    Passive Exposure: Never  Financial Resource Strain: Not on file  Food Insecurity: No Food Insecurity (07/08/2024)   Epic    Worried About Programme Researcher, Broadcasting/film/video in the Last Year: Never true    Ran Out of Food in the Last Year: Never true  Transportation Needs: No Transportation Needs (07/08/2024)   Epic    Lack of Transportation (Medical): No    Lack of Transportation (Non-Medical): No  Physical Activity: Not on file  Stress: Not on file  Social Connections: Moderately Isolated (07/08/2024)   Social Connection and Isolation Panel    Frequency of Communication with Friends and Family: Twice a week    Frequency of Social Gatherings with Friends and Family: Three times a week    Attends Religious Services: Patient unable to answer    Active Member of Clubs or Organizations: No    Attends Banker Meetings: Never    Marital Status: Married  Depression (PHQ2-9): Not on file  Alcohol Screen: Not on file  Housing: Low Risk (07/08/2024)   Epic    Unable to Pay for Housing in the Last Year: No    Number of Times  Moved in the Last Year: 0    Homeless in the Last Year: No  Utilities: Not At Risk (07/08/2024)   Epic    Threatened with loss of utilities: No  Health Literacy: Not on file     Review of Systems: A 12 point ROS discussed and pertinent positives are indicated in the HPI above.  All other systems are negative.  Review of Systems  Constitutional:  Negative for fatigue and fever.  Respiratory:  Negative for cough and shortness of breath.   Cardiovascular:  Negative for chest pain.  Gastrointestinal:  Negative for abdominal pain, nausea and vomiting.  Psychiatric/Behavioral:  Negative for behavioral problems and  confusion.     Vital Signs: BP 139/77   Pulse 79   Temp 97.6 F (36.4 C) (Oral)   Resp 16   Ht 5' 10 (1.778 m)   Wt 140 lb (63.5 kg)   SpO2 97%   BMI 20.09 kg/m   Physical Exam Vitals and nursing note reviewed.  Constitutional:      General: He is not in acute distress.    Appearance: Normal appearance. He is not ill-appearing.  HENT:     Mouth/Throat:     Mouth: Mucous membranes are moist.     Pharynx: Oropharynx is clear.  Cardiovascular:     Rate and Rhythm: Normal rate and regular rhythm.  Pulmonary:     Effort: Pulmonary effort is normal.  Abdominal:     General: Abdomen is flat. There is no distension.     Palpations: Abdomen is soft.  Genitourinary:    Comments: Foley in place.  Skin around tip of penis and scrotum is erythematous and tender.  No skin breakdown. Dark yellow urine in collection bag. Skin:    General: Skin is warm and dry.  Neurological:     General: No focal deficit present.     Mental Status: He is alert and oriented to person, place, and time. Mental status is at baseline.  Psychiatric:        Mood and Affect: Mood normal.        Behavior: Behavior normal.        Thought Content: Thought content normal.        Judgment: Judgment normal.      MD Evaluation Airway: WNL Heart: WNL Abdomen: WNL Chest/ Lungs: WNL ASA   Classification: 3 Mallampati/Airway Score: Two   Imaging: No results found.  Labs:  CBC: Recent Labs    07/10/24 0326 07/11/24 0327 07/21/24 0050 12/06/24 0829  WBC 8.3 8.9 10.2 5.3  HGB 11.6* 11.7* 12.5* 13.2  HCT 37.4* 36.3* 38.3* 40.7  PLT 157 157 214 126*    COAGS: No results for input(s): INR, APTT in the last 8760 hours.  BMP: Recent Labs    07/09/24 0303 07/10/24 0326 07/11/24 0327 07/21/24 0050  NA 136 138 137 131*  K 3.7 3.9 4.0 5.0  CL 105 107 105 97*  CO2 25 24 25 24   GLUCOSE 125* 100* 104* 121*  BUN 13 11 12 15   CALCIUM 8.1* 8.1* 8.2* 9.6  CREATININE 0.57* 0.61 0.62 0.65  GFRNONAA >60 >60 >60 >60    LIVER FUNCTION TESTS: Recent Labs    04/05/24 1121 07/07/24 1634 07/09/24 0303 07/21/24 0050  BILITOT 1.1 0.8 0.5 1.0  AST 16 39 26 16  ALT 7 12 7 5   ALKPHOS 72 112 105 99  PROT 6.9 5.8* 5.2* 6.9  ALBUMIN 3.7 2.4* 2.2* 3.7    TUMOR MARKERS: No results for input(s): AFPTM, CEA, CA199, CHROMGRNA in the last 8760 hours.  Assessment and Plan: BPH with LUTS Patient with past medical history of BPH presents with complaint of progressively worsening lower urinary tract symptoms..  IR consulted for prostate artery embolization at the request of Dr. Watt. Case reviewed by Dr. Johann who met with Jeremiah Zimmerman in consultation and approves patient for procedure.  Patient presents today in their usual state of health.  He has been NPO and is not currently on blood thinners.   Post-procedure meds to be called into his preferred pharmacy.  Ibuprofen  800 mg q 8 hrs x7 days Solifenancin 5mg  every day x7  days Phenazopyridine  100 mg TID x7 days Medrol  dose pack He is already taking Nitrofurantoin  daily per Urology. No additional abx called in at this time.   The Risks and benefits of embolization were discussed with the patient including, but not limited to bleeding, infection, vascular injury, post operative pain, or contrast induced renal  failure.  This procedure involves the use of X-rays and because of the nature of the planned procedure, it is possible that we will have prolonged use of X-ray fluoroscopy.  Potential radiation risks to you include (but are not limited to) the following: - A slightly elevated risk for cancer several years later in life. This risk is typically less than 0.5% percent. This risk is low in comparison to the normal incidence of human cancer, which is 33% for women and 50% for men according to the [employer] Society. - Radiation induced injury can include skin redness, resembling a rash, tissue breakdown / ulcers and hair loss (which can be temporary or permanent).   The likelihood of either of these occurring depends on the difficulty of the procedure and whether you are sensitive to radiation due to previous procedures, disease, or genetic conditions.   IF your procedure requires a prolonged use of radiation, you will be notified and given written instructions for further action.  It is your responsibility to monitor the irradiated area for the 2 weeks following the procedure and to notify your physician if you are concerned that you have suffered a radiation induced injury.    All of the patient's questions were answered, patient is agreeable to proceed. Consent signed and in chart.   Thank you for this interesting consult.  I greatly enjoyed meeting Jeremiah Zimmerman and look forward to participating in their care.  A copy of this report was sent to the requesting provider on this date.  Electronically Signed: Angy Swearengin Sue-Ellen Shalayna Ornstein, PA 12/06/2024, 9:21 AM   I spent a total of  40 Minutes   in face to face in clinical consultation, greater than 50% of which was counseling/coordinating care for benign prostatic hypertrophy  "

## 2024-12-06 ENCOUNTER — Other Ambulatory Visit: Payer: Self-pay | Admitting: Student

## 2024-12-06 ENCOUNTER — Other Ambulatory Visit (HOSPITAL_COMMUNITY): Payer: Self-pay | Admitting: Interventional Radiology

## 2024-12-06 ENCOUNTER — Ambulatory Visit (HOSPITAL_COMMUNITY)
Admission: RE | Admit: 2024-12-06 | Discharge: 2024-12-06 | Disposition: A | Discharge status: Home / Self Care | Source: Ambulatory Visit | Attending: Interventional Radiology | Admitting: Interventional Radiology

## 2024-12-06 ENCOUNTER — Encounter (HOSPITAL_COMMUNITY): Payer: Self-pay

## 2024-12-06 ENCOUNTER — Other Ambulatory Visit: Payer: Self-pay

## 2024-12-06 ENCOUNTER — Ambulatory Visit (HOSPITAL_COMMUNITY)
Admission: RE | Admit: 2024-12-06 | Discharge: 2024-12-06 | Disposition: A | Source: Ambulatory Visit | Attending: Interventional Radiology

## 2024-12-06 DIAGNOSIS — R3915 Urgency of urination: Secondary | ICD-10-CM | POA: Diagnosis not present

## 2024-12-06 DIAGNOSIS — G20A1 Parkinson's disease without dyskinesia, without mention of fluctuations: Secondary | ICD-10-CM | POA: Insufficient documentation

## 2024-12-06 DIAGNOSIS — N401 Enlarged prostate with lower urinary tract symptoms: Secondary | ICD-10-CM | POA: Insufficient documentation

## 2024-12-06 DIAGNOSIS — C679 Malignant neoplasm of bladder, unspecified: Secondary | ICD-10-CM

## 2024-12-06 DIAGNOSIS — Z66 Do not resuscitate: Secondary | ICD-10-CM | POA: Insufficient documentation

## 2024-12-06 HISTORY — PX: IR ANGIOGRAM SELECTIVE EACH ADDITIONAL VESSEL: IMG667

## 2024-12-06 HISTORY — PX: IR EMBO TUMOR ORGAN ISCHEMIA INFARCT INC GUIDE ROADMAPPING: IMG5449

## 2024-12-06 HISTORY — PX: IR 3D INDEPENDENT WKST: IMG2385

## 2024-12-06 HISTORY — PX: IR ANGIOGRAM PELVIS SELECTIVE OR SUPRASELECTIVE: IMG661

## 2024-12-06 HISTORY — PX: IR US GUIDE VASC ACCESS RIGHT: IMG2390

## 2024-12-06 LAB — CBC
HCT: 40.7 % (ref 39.0–52.0)
Hemoglobin: 13.2 g/dL (ref 13.0–17.0)
MCH: 30.1 pg (ref 26.0–34.0)
MCHC: 32.4 g/dL (ref 30.0–36.0)
MCV: 92.9 fL (ref 80.0–100.0)
Platelets: 126 K/uL — ABNORMAL LOW (ref 150–400)
RBC: 4.38 MIL/uL (ref 4.22–5.81)
RDW: 12.3 % (ref 11.5–15.5)
WBC: 5.3 K/uL (ref 4.0–10.5)
nRBC: 0 % (ref 0.0–0.2)

## 2024-12-06 LAB — PROTIME-INR
INR: 1.1 (ref 0.8–1.2)
Prothrombin Time: 14.4 s (ref 11.4–15.2)

## 2024-12-06 MED ORDER — KETOROLAC TROMETHAMINE 30 MG/ML IJ SOLN
INTRAMUSCULAR | Status: AC
  Filled 2024-12-06: qty 1

## 2024-12-06 MED ORDER — FENTANYL CITRATE (PF) 100 MCG/2ML IJ SOLN
INTRAMUSCULAR | Status: AC
  Filled 2024-12-06: qty 2

## 2024-12-06 MED ORDER — MIDAZOLAM HCL (PF) 2 MG/2ML IJ SOLN
INTRAMUSCULAR | Status: AC | PRN
  Administered 2024-12-06: 1 mg via INTRAVENOUS

## 2024-12-06 MED ORDER — SOLIFENACIN SUCCINATE 5 MG PO TABS: 5.0000 mg | ORAL_TABLET | Freq: Every day | ORAL | 0 refills | Status: AC

## 2024-12-06 MED ORDER — MIDAZOLAM HCL 2 MG/2ML IJ SOLN
INTRAMUSCULAR | Status: AC
  Filled 2024-12-06: qty 2

## 2024-12-06 MED ORDER — IOHEXOL 300 MG/ML  SOLN
100.0000 mL | Freq: Once | INTRAMUSCULAR | Status: AC | PRN
  Administered 2024-12-06: 30 mL via INTRA_ARTERIAL

## 2024-12-06 MED ORDER — SODIUM CHLORIDE 0.9 % IV SOLN: INTRAVENOUS | Status: DC

## 2024-12-06 MED ORDER — IOHEXOL 300 MG/ML  SOLN
50.0000 mL | Freq: Once | INTRAMUSCULAR | Status: AC | PRN
  Administered 2024-12-06: 15 mL

## 2024-12-06 MED ORDER — CIPROFLOXACIN IN D5W 400 MG/200ML IV SOLN
400.0000 mg | Freq: Once | INTRAVENOUS | Status: AC
  Administered 2024-12-06: 400 mg via INTRAVENOUS
  Filled 2024-12-06: qty 200

## 2024-12-06 MED ORDER — IBUPROFEN 800 MG PO TABS: 800.0000 mg | ORAL_TABLET | Freq: Three times a day (TID) | ORAL | 0 refills | Status: AC

## 2024-12-06 MED ORDER — NITROGLYCERIN 1 MG/10 ML FOR IR/CATH LAB
INTRA_ARTERIAL | Status: AC | PRN
  Administered 2024-12-06: 100 ug via INTRA_ARTERIAL

## 2024-12-06 MED ORDER — IOHEXOL 300 MG/ML  SOLN
100.0000 mL | Freq: Once | INTRAMUSCULAR | Status: AC | PRN
  Administered 2024-12-06: 20 mL via INTRA_ARTERIAL

## 2024-12-06 MED ORDER — LIDOCAINE HCL 1 % IJ SOLN
20.0000 mL | Freq: Once | INTRAMUSCULAR | Status: AC
  Administered 2024-12-06: 5 mL via INTRADERMAL

## 2024-12-06 MED ORDER — DEXAMETHASONE SOD PHOSPHATE PF 10 MG/ML IJ SOLN
8.0000 mg | Freq: Once | INTRAMUSCULAR | Status: AC
  Administered 2024-12-06: 8 mg via INTRAVENOUS
  Filled 2024-12-06: qty 1

## 2024-12-06 MED ORDER — FENTANYL CITRATE (PF) 100 MCG/2ML IJ SOLN
INTRAMUSCULAR | Status: AC | PRN
  Administered 2024-12-06: 25 ug via INTRAVENOUS

## 2024-12-06 MED ORDER — KETOROLAC TROMETHAMINE 30 MG/ML IJ SOLN
30.0000 mg | Freq: Once | INTRAMUSCULAR | Status: AC
  Administered 2024-12-06: 30 mg via INTRAVENOUS
  Filled 2024-12-06: qty 1

## 2024-12-06 MED ORDER — PHENAZOPYRIDINE HCL 97.2 MG PO TABS: 97.0000 mg | ORAL_TABLET | Freq: Three times a day (TID) | ORAL | 0 refills | Status: AC

## 2024-12-06 MED ORDER — METHYLPREDNISOLONE 4 MG PO TBPK: ORAL_TABLET | ORAL | 0 refills | Status: AC

## 2024-12-06 MED ORDER — IOHEXOL 300 MG/ML  SOLN
50.0000 mL | Freq: Once | INTRAMUSCULAR | Status: AC | PRN
  Administered 2024-12-06: 20 mL

## 2024-12-06 MED ORDER — NAPROXEN 250 MG PO TABS
500.0000 mg | ORAL_TABLET | Freq: Once | ORAL | Status: DC
  Filled 2024-12-06: qty 2

## 2024-12-06 MED ORDER — NITROGLYCERIN IN D5W 100-5 MCG/ML-% IV SOLN
INTRAVENOUS | Status: AC
  Filled 2024-12-06: qty 250

## 2024-12-06 MED ORDER — MIDAZOLAM HCL (PF) 2 MG/2ML IJ SOLN
INTRAMUSCULAR | Status: AC | PRN
  Administered 2024-12-06: .5 mg via INTRAVENOUS

## 2024-12-06 MED ORDER — CIPROFLOXACIN IN D5W 400 MG/200ML IV SOLN
INTRAVENOUS | Status: AC
  Filled 2024-12-06: qty 200

## 2024-12-06 MED ORDER — LIDOCAINE HCL 1 % IJ SOLN
INTRAMUSCULAR | Status: AC
  Filled 2024-12-06: qty 20

## 2024-12-06 MED ORDER — CIPROFLOXACIN HCL 500 MG PO TABS
500.0000 mg | ORAL_TABLET | Freq: Once | ORAL | Status: DC
  Filled 2024-12-06: qty 1

## 2024-12-06 MED ORDER — HYDROCODONE-ACETAMINOPHEN 5-325 MG PO TABS
1.0000 | ORAL_TABLET | ORAL | Status: DC | PRN
  Administered 2024-12-06: 2 via ORAL
  Filled 2024-12-06: qty 2

## 2024-12-06 NOTE — Discharge Instructions (Signed)
 Please call Interventional Radiology clinic [phone] with any questions or concerns.  You may remove your dressing and shower tomorrow.  After the procedure, it is common to have: Increased frequency of urination Mild pressure or discomfort in the low belly Blood in the urine and/or painful urination for 7-14 days  Blood in the semen for 7-21 days Pressure, pain, or the urge to have a bowel movement when urinating and/or a small amount of blood in the stool for 4-7 days Low-grade fever (temperature less than 101.5 degrees Fahrenheit or 38.6 degrees Celsius) for 4-7 days Tenderness and/or bruising around the puncture site. Bruising can take 2-3 weeks to go away completely. Please call immediately if you feel a lump that is growing or varies with your pulse  Follow these instructions at home:  Medication: Do not use Aspirin or ibuprofen products, such as Advil or Motrin, as it may increase bleeding You may resume your usual medications as ordered by your doctor If your doctor prescribed antibiotics, take them as directed. Do not stop taking them just because you feel better  Eating and drinking: Drink plenty of liquids to keep your urine pale yellow You can resume your regular diet as directed by your doctor   Activity For procedures where we entered your artery from the top of your leg (groin):  Avoid strenuous activity, such as climbing long flights of stairs, for 24 hours after your procedure  No heavy lifting (greater than 15-20 pounds or 6-9 kg) for 7 days or until the puncture site heels Do not take baths, swim, or use a hot tub until your health care provider approves. Take showers only Keep all follow-up visits as told by your doctor  Care of the procedure site Follow instructions from your health care provider about how to take care of the puncture site.  Wash your hands with soap and water before you change your bandage (dressing). If soap and water are not available, use  hand sanitizer Change your dressing as told by your health care provider Leave stitches (sutures), skin glue, or adhesive strips in place. These skin closures may need to stay in place for 2 weeks or longer. If adhesive strip edges start to loosen and curl up, you may trim the loose edges. Do not remove adhesive strips completely unless your health care provider tells you to do that Check your puncture site every day for signs of infection. Check for: More redness, swelling, or pain Warmth/heat at puncture site Pus or a bad odor  Contact a health care provider if: You have a fever You have more redness, swelling, or pain around your incision You have more fluid or blood coming from your incision site Your incision feels warm to the touch You have pus or a bad smell coming from your incision You have a rash You have nausea, or you cannot eat or drink anything without vomiting  Get help right away if: You have trouble breathing You have chest pain You have severe pain in your abdomen, and it does not get better with medicine You have leg pain or leg swelling You feel dizzy, or you faint   Moderate Conscious Sedation-Care After  This sheet gives you information about how to care for yourself after your procedure. Your health care provider may also give you more specific instructions. If you have problems or questions, contact your health care provider.  After the procedure, it is common to have: Sleepiness for several hours. Impaired judgment for several hours.  Difficulty with balance. Vomiting if you eat too soon.  Follow these instructions at home:  Rest. Do not participate in activities where you could fall or become injured. Do not drive or use machinery. Do not drink alcohol. Do not take sleeping pills or medicines that cause drowsiness. Do not make important decisions or sign legal documents. Do not take care of children on your own.  Eating and drinking Follow the  diet recommended by your health care provider. Drink enough fluid to keep your urine pale yellow. If you vomit: Drink water, juice, or soup when you can drink without vomiting. Make sure you have little or no nausea before eating solid foods.  General instructions Take over-the-counter and prescription medicines only as told by your health care provider. Have a responsible adult stay with you for the time you are told. It is important to have someone help care for you until you are awake and alert. Do not smoke. Keep all follow-up visits as told by your health care provider. This is important.  Contact a health care provider if: You are still sleepy or having trouble with balance after 24 hours. You feel light-headed. You keep feeling nauseous or you keep vomiting. You develop a rash. You have a fever. You have redness or swelling around the IV site.  Get help right away if: You have trouble breathing. You have new-onset confusion at home.  This information is not intended to replace advice given to you by your health care provider. Make sure you discuss any questions you have with your healthcare provider.

## 2024-12-06 NOTE — Procedures (Signed)
" °  Procedure:  Bilat prostate artery embolization  Preprocedure diagnosis: Diagnoses of BPH with lower urinary tract symptoms without urinary obstruction and Malignant neoplasm of urinary bladder, unspecified site [facility]) were pertinent to this visit. Postprocedure diagnosis: same EBL:    minimal Complications:   none immediate  See full dictation in [employer].  CHARM Toribio Faes MD Main # [phone] Pager  [phone] Mobile [phone]    "

## 2024-12-06 NOTE — Sedation Documentation (Signed)
 RN Aidian Salomon pulled 2 mg Versed  and 100 mcg Fentanyl  in IR room. Pt. Received 1.5 mg Versed  and 50 mcg Fentanyl  throughout the procedure. RN Eldana Isip wasted 0.5 mg Versed  and 50 mcg Fentanyl  with Rolan Cohens RN in CT pysix.

## 2025-02-01 ENCOUNTER — Ambulatory Visit: Admitting: Neurology
